# Patient Record
Sex: Male | Born: 1935 | Race: White | Hispanic: No | Marital: Single | Smoking: Never smoker
Health system: Southern US, Community
[De-identification: ages and names within clinical notes are randomized; demographics above are authoritative.]

## PROBLEM LIST (undated history)

## (undated) DIAGNOSIS — H5440 Blindness, one eye, unspecified eye: Secondary | ICD-10-CM

## (undated) DIAGNOSIS — R296 Repeated falls: Secondary | ICD-10-CM

## (undated) DIAGNOSIS — F0280 Dementia in other diseases classified elsewhere without behavioral disturbance: Secondary | ICD-10-CM

## (undated) DIAGNOSIS — C44509 Unspecified malignant neoplasm of skin of other part of trunk: Secondary | ICD-10-CM

## (undated) DIAGNOSIS — I639 Cerebral infarction, unspecified: Secondary | ICD-10-CM

## (undated) DIAGNOSIS — H3410 Central retinal artery occlusion, unspecified eye: Secondary | ICD-10-CM

## (undated) DIAGNOSIS — H3320 Serous retinal detachment, unspecified eye: Secondary | ICD-10-CM

## (undated) DIAGNOSIS — F32A Depression, unspecified: Secondary | ICD-10-CM

## (undated) DIAGNOSIS — R269 Unspecified abnormalities of gait and mobility: Secondary | ICD-10-CM

## (undated) DIAGNOSIS — G459 Transient cerebral ischemic attack, unspecified: Secondary | ICD-10-CM

## (undated) DIAGNOSIS — C61 Malignant neoplasm of prostate: Secondary | ICD-10-CM

## (undated) DIAGNOSIS — F329 Major depressive disorder, single episode, unspecified: Secondary | ICD-10-CM

## (undated) DIAGNOSIS — G309 Alzheimer's disease, unspecified: Secondary | ICD-10-CM

## (undated) HISTORY — PX: PROSTATECTOMY: SHX69

## (undated) HISTORY — DX: Central retinal artery occlusion, unspecified eye: H34.10

## (undated) HISTORY — PX: RETINAL DETACHMENT SURGERY: SHX105

## (undated) HISTORY — PX: SKIN CANCER EXCISION: SHX779

## (undated) HISTORY — PX: TONSILLECTOMY: SUR1361

## (undated) HISTORY — PX: CATARACT EXTRACTION W/ INTRAOCULAR LENS  IMPLANT, BILATERAL: SHX1307

## (undated) HISTORY — PX: EYE SURGERY: SHX253

## (undated) HISTORY — PX: HERNIA REPAIR: SHX51

## (undated) HISTORY — PX: ABDOMINAL HERNIA REPAIR: SHX539

## (undated) HISTORY — DX: Unspecified abnormalities of gait and mobility: R26.9

## (undated) HISTORY — PX: FRACTURE SURGERY: SHX138

---

## 2000-04-10 ENCOUNTER — Encounter: Admission: RE | Admit: 2000-04-10 | Discharge: 2000-07-09 | Payer: Self-pay | Admitting: Radiation Oncology

## 2000-05-11 ENCOUNTER — Encounter: Payer: Self-pay | Admitting: Urology

## 2000-05-15 ENCOUNTER — Inpatient Hospital Stay (HOSPITAL_COMMUNITY): Admission: RE | Admit: 2000-05-15 | Discharge: 2000-05-18 | Payer: Self-pay | Admitting: Urology

## 2000-05-15 ENCOUNTER — Encounter (INDEPENDENT_AMBULATORY_CARE_PROVIDER_SITE_OTHER): Payer: Self-pay | Admitting: Specialist

## 2001-02-16 ENCOUNTER — Encounter: Admission: RE | Admit: 2001-02-16 | Discharge: 2001-02-16 | Payer: Self-pay | Admitting: General Surgery

## 2001-02-16 ENCOUNTER — Encounter: Payer: Self-pay | Admitting: General Surgery

## 2001-02-20 ENCOUNTER — Ambulatory Visit (HOSPITAL_BASED_OUTPATIENT_CLINIC_OR_DEPARTMENT_OTHER): Admission: RE | Admit: 2001-02-20 | Discharge: 2001-02-20 | Payer: Self-pay | Admitting: General Surgery

## 2001-05-08 ENCOUNTER — Observation Stay (HOSPITAL_COMMUNITY): Admission: RE | Admit: 2001-05-08 | Discharge: 2001-05-09 | Payer: Self-pay | Admitting: General Surgery

## 2002-11-20 ENCOUNTER — Ambulatory Visit (HOSPITAL_COMMUNITY): Admission: RE | Admit: 2002-11-20 | Discharge: 2002-11-21 | Payer: Self-pay | Admitting: Ophthalmology

## 2002-11-20 ENCOUNTER — Encounter: Payer: Self-pay | Admitting: Ophthalmology

## 2005-11-01 ENCOUNTER — Ambulatory Visit (HOSPITAL_COMMUNITY): Admission: RE | Admit: 2005-11-01 | Discharge: 2005-11-02 | Payer: Self-pay | Admitting: Ophthalmology

## 2008-01-16 ENCOUNTER — Encounter (HOSPITAL_COMMUNITY): Admission: RE | Admit: 2008-01-16 | Discharge: 2008-01-24 | Payer: Self-pay | Admitting: Urology

## 2010-09-24 NOTE — Op Note (Signed)
[place]  Patient:    Jimmy Solis, Jimmy Solis Visit Number: [id number] MRN: [id number]          Service Type: DSU Location: [facility] Attending Physician:  Arlis Porta Dictated by:   Adolph Pollack, M.D. Proc. Date: 02/20/01 Admit Date:  02/20/2001   CC:         Mark C. Vernie Ammons, M.D.   Operative Report  PREOPERATIVE DIAGNOSIS:  Right inguinal hernia.  POSTOPERATIVE DIAGNOSIS:  Direct right inguinal hernia.  OPERATION PERFORMED:  Right inguinal hernia repair with mesh.  SURGEON:  Adolph Pollack, M.D.  ANESTHESIA:  Local 1% lidocaine with epinephrine plus 0.5% plain Marcaine with sodium bicarbonate with MAC.  INDICATIONS FOR PROCEDURE:  The patient is a 77 year old male who has noticed a growing right inguinal bulge.  He has been having some significant discomfort from the bulge.  On examination, it is consistent with a right inguinal hernia and now he presents for repair.  The procedure and the risks were explained to him preoperatively.  DESCRIPTION OF PROCEDURE:  He was placed supine on the operating table and given intravenous sedation.  The right groin area was shaved and sterilely prepped and draped.  An incision was marked with the marking pen.  Local anesthetic was infiltrated superficially and deep along the line of the incision and then the incision was made sharply down to the level of the Scarpas fascia which was then divided with the cautery.  More local anesthetic was infiltrated deep to the external oblique aponeurosis and then the external oblique aponeurosis was split in the direction of its fibers toward the pubic tubercle.  However, it appeared that the scar tissue from his radical prostatectomy incision appeared to make this more difficult. Laterally, it was more free and ____________  I identified the direct defect and separated the spermatic cord from it.  I next identified the shelving edge of the inguinal  ligament inferiorly and the internal oblique aponeurosis superiorly.  A piece of mesh was brought into the field and anchored 1 cm medial to the pubic tubercle with 2-0 Prolene suture. The inferior aspect of the mesh was then anchored to the shelving edge of the inguinal ligament up to the level of the internal ring with running 2-0 Prolene suture.  A slit was cut in the mesh and was wrapped around the tail. The superior aspect of the mesh was anchored to the internal oblique aponeurosis with interrupted 2-0 Vicryl sutures.  The two tails were crossed around the spermatic cord creating a new internal ring and this was anchored to the shelving edge of the inguinal ligament with 2-0 Prolene suture.  This allowed for the tip of the hemostat to be placed  through it.  The direct defect was adequately covered.  The tails of the mesh were placed laterally beneath the external oblique aponeurosis  which was then closed with a running 3-0 Vicryl suture over the mesh.  I did divide the ilioinguinal nerve because there was close adherence with the external oblique aponeurosis and the difficulty to separate it from that medially.  I then closed Scarpas fascia with a running 2-0 Vicryl suture.  The skin was closed with 4-0 Monocryl subcuticular stitch. Steri-Strips and sterile dressings were applied.  The patient tolerated the procedure well without any apparent complications and was taken to the recovery room in satisfactory condition. Dictated by:   Adolph Pollack, M.D. Attending Physician:  Arlis Porta DD:  02/20/01 TD:  02/20/01 Job: [id number]

## 2010-09-24 NOTE — Op Note (Signed)
NAME:  Jimmy Solis, Jimmy Solis                         ACCOUNT NO.:  [id number]   MEDICAL RECORD NO.:  [id number]                   PATIENT TYPE:  OIB   LOCATION:  2864                                 FACILITY:  [facility]   PHYSICIAN:  Beulah Gandy. Ashley Royalty, M.D.              DATE OF BIRTH:  24-Aug-1928   DATE OF PROCEDURE:  11/20/2002  DATE OF DISCHARGE:                                 OPERATIVE REPORT   PREOPERATIVE DIAGNOSES:  1. Retained lens material.  2. 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   PROCEDURES:  Pars plana vitrectomy with removal of retained lens material,  retinal photocoagulation, posterior capsulectomy, and wash-out of anterior  chamber in the left eye.   SURGEON:  Beulah Gandy. Ashley Royalty, M.D.   ASSISTANT:  Bryan Lemma. Lundquist, P.A.   ANESTHESIA:  General.   DETAILS:  Usual prep and drape.  Peritomies at 10, 2, and 4 o'clock.  The 6  mm infusion port was anchored into place at 4 o'clock.  The lighted pick and  the cutter were placed at 10 and 2 o'clock, respectively.  The vitreous  cutter was used to engage lens material from around the edges of the  implant.  Large areas of white material were seen behind the implant and  anterior to the implant.  This was carefully removed under low suction and  rapid cutting.  Care was taken to allow capsule to remain at the 3 and 9  o'clock position to maintain the implant's stability.  Provisc was then  placed on the corneal surface and the BIOM was moved into place.  The  vitrectomy was carried posteriorly, where all vitreous was carefully removed  under low suction and rapid cutting.  Large white pearly material was seen  and removed in this manner.  The eye was rotated in all directions to remove  pearly white material from the vitreous base for 360 degrees.  Two cotton  wool spots were seen along the lower arcade and below the disc.  The [place]  [facility] brush was used to approach these cotton wool spots to see if  there were pieces of lens  material that would lift but there were not, and  the retina was not touched.  A wash-out procedure was performed.  The  instruments were removed from the eye and 9-0 nylon was used to close the  sclerotomy sites.  The sites were tested and found to be tight.  The  conjunctiva was closed with wet-field cautery.  Polymyxin and gentamicin  were irrigated into Tenon's space.  Decadron 10 mg was injected into the  lower subconjunctival space.  Marcaine was injected around the globe for  postop pain.  The indirect ophthalmoscope laser was moved into place and 496  burns were placed around the retinal periphery in two rows with a power of  between 500 and 600 milliwatts and 0.05 to 0.07 seconds each.  The diameter  was 1000 microns.  Once this was accomplished, the pressure was checked and  found to be 10 with a Barraquer tonometer.  Polysporin, a patch and shield  were placed.  The patient was awakened and taken to recovery in a  satisfactory condition.                                               Beulah Gandy. Ashley Royalty, M.D.    JDM/MEDQ  D:  11/20/2002  T:  11/21/2002  Job:  [id number]

## 2010-09-24 NOTE — Op Note (Signed)
NAMEBEAUMONT, AUSTAD               ACCOUNT NO.:  [id number]   MEDICAL RECORD NO.:  [id number]          PATIENT TYPE:  AMB   LOCATION:  SDS                          FACILITY:  [facility]   PHYSICIAN:  John D. Ashley Royalty, M.D. DATE OF BIRTH:  [birth year]   DATE OF PROCEDURE:  11/01/2005  DATE OF DISCHARGE:                                 OPERATIVE REPORT   PREOPERATIVE DIAGNOSIS:  Rhegmatogenous retinal detachment left eye.   PROCEDURES:  Scleral buckle, pars plana vitrectomy, retinal  photocoagulation, membrane peel, gas fluid exchange, Perfluoron injection,  Perfluoron removal, C3F8 injection, left eye.   SURGEON:  Alan Mulder, M.D.   ASSISTANT:  Bryan Lemma. Lundquist, P.A.   ANESTHESIA:  General.   DETAILS:  Usual prep and drape, 360 degrees limbal peritomy, isolation of  four rectus muscles on 2-0 silk.  Scleral dissection for 360 degrees to  admit number 279 intrascleral implant.  Diathermy placed in the bed. Two  sutures per quadrant for a total of eight sutures were placed in the scleral  flaps.  The 279 implant was placed around the eye with a joint at 2 o'clock  and 240 band was placed around the eye with a 270 sleeve at 10 o'clock.  Perforation site chosen at 10 o'clock in the posterior aspect of the bed.  A  moderate amount of clear colorless subretinal fluid came forth.  The scleral  buckle was shortened and trimmed.  The band was adjusted and trimmed.  The  sutures were knotted and the free ends removed.  The attention was carried  to the pars plana area where sclerotomy sites were entered at 10, 2, and 4  o'clock.  The 5-mm infusion port placed at 4 o'clock.  Provisc placed on the  corneal surface and the Biome viewing system moved into place.  The pars  plana vitrectomy was begun just behind the pseudophakos.  The vitrectomy is  carried posteriorly and some vitreous membranes were seen.  These were  engaged with the vitreous pick and peeled. The vitreous strands were fed  into the vitreous cutter.  The vitrectomy is carried into the periphery  where vitreous was removed from its attachments to the vitreous base and the  areas where the buckle was supporting the peripheral retina.  Perfluoron was  then injected over the macular region to reattach the entire retina.  The  endolaser was positioned in the eye, 840 burns placed around the retinal  periphery with a power of 1000 milliwatts, 1000 microns each and 0.1 seconds  each.  Perfluoron was removed.  The vitreous cavity was rinsed. Gas fluid  exchange was performed.  Perfluoron propane 14% concentration was then  exchanged for intravitreal gas.  The instruments removed from the eye and 9-  0 nylon was used to close the sclerotomy sites.  The conjunctiva was  reposited with 7-0 chromic suture.  Paracentesis x1 obtained a closing  pressure of 15 with a Barraquer tonometer. Polymyxin and gentamicin were  irrigated into tenons space, Decadron 10 mg was injected to  the lower subconjunctival space.  Marcaine was injected around the  globe for  postop pain.  TobraDex ophthalmic ointment, patch and shield were placed.  The patient is awakened and taken to recovery in satisfactory condition.  The complications were none.  Duration was 2-1/2 hours.      Beulah Gandy. Ashley Royalty, M.D.  Electronically Signed     JDM/MEDQ  D:  11/01/2005  T:  11/02/2005  Job:  [id number]

## 2010-09-24 NOTE — H&P (Signed)
[facility]  Patient:    Jimmy Solis, Jimmy Solis Visit Number: [id number] MRN: [id number]          Service Type: DSU Location: DAY Attending Physician:  Arlis Porta Dictated by:   Adolph Pollack, M.D. Admit Date:  05/08/2001                           History and Physical  REASON FOR ADMISSION:  Elective repair of recurrent right inguinal hernia.  HISTORY OF PRESENT ILLNESS:  Jimmy Solis is a 77 year old male who underwent repair of a right inguinal hernia by me in October.  On the second postoperative day he got out of bed and twisted in a very awkward-type way and felt severe pain near the medial aspect of the incision and then noticed a bulge.  When I saw him for his first postoperative visit in early November, indeed, he had an early recurrent hernia.  He now presents for repair.  We are going to try the laparoscopic technique.  PAST MEDICAL HISTORY: 1. Prostate cancer. 2. Hypercholesterolemia. 3. Cataracts.  PAST SURGICAL HISTORY: 1. Radical retropubic prostatectomy. 2. Tonsillectomy. 3. Cataract removal from right eye. 4. Right inguinal hernia repair.  ALLERGIES:  PENICILLIN.  MEDICATIONS:  Pravachol.  SOCIAL HISTORY:  No tobacco or alcohol use.  FAMILY HISTORY:  Father died from tuberculosis.  Mother died from a stroke. Brother died from lung cancer.  PHYSICAL EXAMINATION:  GENERAL:  Well-developed, well-nourished male in no acute distress.  Alert, pleasant, and cooperative.  CARDIOVASCULAR:  Regular rate and rhythm without a murmur.  LUNGS:  Breath sounds equal and clear.  Respirations not labored.  ABDOMEN:  Soft and nontender.  Well-healed lower midline scar.  GENITOURINARY:  Healed right inguinal incision.  In the supine position he has a reducible recurrent hernia.  EXTREMITIES:  No cyanosis or edema.  IMPRESSION:  Early recurrence of right inguinal hernia repair with mesh.  I did tell him that during the  procedure I noticed that the area where I normally fix the mesh medially demonstrated very weak tissue.  PLAN:  Laparoscopic transabdominal preperitoneal approach.  I did explain the procedure, rationale, and risks to him.  He understands these and is agreeable to proceed. Dictated by:   Adolph Pollack, M.D. Attending Physician:  Arlis Porta DD:  05/08/01 TD:  05/08/01 Job: [id number]

## 2010-09-24 NOTE — Op Note (Signed)
[facility]  Patient:    Jimmy Solis, Jimmy Solis Visit Number: [id number] MRN: [id number]          Service Type: SUR Location: 3W 0362 01 Attending Physician:  Arlis Porta Dictated by:   Adolph Pollack, M.D. Proc. Date: 05/08/01 Admit Date:  05/08/2001                             Operative Report  PREOPERATIVE DIAGNOSIS:   Recurrent right inguinal hernia.  POSTOPERATIVE DIAGNOSIS:  Recurrent right inguinal hernia.  PROCEDURE:  Laparoscopic repair of recurrent right inguinal hernia with mesh (TAPP).  SURGEON:  Adolph Pollack, M.D.  ANESTHESIA:  General.  INDICATION:  This is a 77 year old male, who back in October, had an open right inguinal hernia repaired with mesh that rapidly recurred.  He now presents for laparoscopic transabdominal hernia repair.  I discussed with him I felt this was best because he had had the radical retropubic prostatectomy, and I did not think an extraperitoneal approach could be used.  Also, I did not think that a redo anterior approach would be helpful because he had such weak tissue to anchor the mesh medially.  TECHNIQUE:  He was placed supine on the operating table, and a general anesthetic was administered.  Foley catheter was sterilely placed into his bladder.  The abdomen and groin area were sterilely prepped and draped.  Local anesthetic was infiltrated in the infraumbilical region, and a small infraumbilical incision made and carried down to the subcutaneous tissue.  The fascia was identified in the midline and an incision made in the fascia, the peritoneal cavity entered bluntly and under direct vision.  A pursestring suture of 0 Vicryl was placed around the fascial edges.  A Hasson trocar was introduced to the peritoneal cavity and pneumoperitoneum created by insufflation of CO2 gas.  Next, the laparoscope was introduced.  He was placed in the Trendelenburg position.  There, the hernia on the  right side was evident.  I put two 5 mm trocars just to the left of the midline into the peritoneal cavity under direct vision.  I then incised the peritoneum of the anterior abdominal wall, superior to the internal ring, and carried this across the epigastric vessels to the level of the left epigastric vessels.  Using blunt dissection, I mobilized the peritoneum posteriorly to expose the anterior extraperitoneal space.  I subsequently was able to reduce a hernia sac from the internal ring in the indirect position.  I isolated all the spermatic cord structures.  I continued to reduce the peritoneum posteriorly, anteriorly, and laterally.  I then used blunt dissection and some sharp dissection to reduce it medially.  I did identify Coopers ligament.  Using blunt dissection, I isolated it to the symphysis pubis and a little bit to the left of that area.  I did do some blunt manipulation below it.  Once I had all this area cleaned off, I cut a piece of mesh to 4 inches x 6 inches.  On one side of the mesh was Seprafilm nonadherent-type material, and the other side was polypropylene.  Thus, a composite-type mesh.  I placed into the abdominal cavity.  I then anchored the inferomedial aspect of the mesh to Coopers ligament with the spiral tacker. I subsequently anchored the anterior bilateral aspect of the mesh to the abdominal wall and then used tags to anchor it to the anterior abdominal wall and to the  midline area.  This provided adequate coverage of the direct and indirect spaces.  I inspected the area and noted no active bleeding.  There was some old blood in it, and I did evacuate this.  I then grasped the peritoneum and tacked it back up to the mesh, covering the mesh in most spaces, but there still was some exposed area of the Seprafilm. I released the pneumoperitoneum and observed the abdominal contents approximating to the mesh.  I then removed the trocars.  The subumbilical fascial  defect was closed by tightening up and tying down the pursestring suture.  The skin incisions were closed with 4-0 Monocryl subcuticular stitches.  Steri-Strips and sterile dressings were applied.  I examined his scrotum, and the right testicle was in the appropriate position.  I did notice that his urine was somewhat blood-tinged.  I put some pressure on the suprapubic region, and it started to clear up some, but I decided to leave the Foley in.  He subsequently was extubated and taken to the recovery room in satisfactory condition.  He will be kept overnight in a bed for observation. Dictated by:   Adolph Pollack, M.D. Attending Physician:  Arlis Porta DD:  05/08/01 TD:  05/08/01 Job: [id number]

## 2010-09-24 NOTE — H&P (Signed)
[facility]  Patient:    Jimmy Solis, COPELAN                     MRN: [id number] Adm. Date:  05/16/00 Attending:  Loraine Leriche C. Vernie Ammons, M.D.                         History and Physical  CHIEF COMPLAINT:  Prostate cancer.  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  His primary care physician is Dr. Lawana Pai.  He has a history of having had a biopsy back in September 1990 that revealed BPH.  Due to a rise in his PSA to 3.7 in April 2001 and further rise to 3.8 in September 2001, he underwent repeat biopsy in November 2001. His examination at that time had revealed suspicious nodule that had developed on the right lobe of his prostate, and his biopsy revealed adenocarcinoma, Gleason score of 7 (3+4), involving approximately 25% of the needle core biopsies from that site.  Perineural space was identified.  Benign prostatic hypertrophy was noted on the left side.  He had previously discussed his treatment options with Dr. Harvin Hazel, and also had discussed his options thoroughly with Dr. Dan Humphreys.  When he presented to my office, he had pretty much made a decision to proceed with a radical prostatectomy, but we went over this further once again from the beginning just so that I was sure he understood all that was involved.  He is admitted at this time for elective radical retropubic prostatectomy.  PAST MEDICAL HISTORY:  Hypertension and hypercholesterolemia.  PAST SURGICAL HISTORY:  Tonsillectomy in 1934.  MEDICATIONS:  Aspirin, niacin, Pravachol 40 mg, and a multivitamin.  ALLERGIES:  PENICILLIN.  SOCIAL HISTORY:  Negative for tobacco or ethanol.  FAMILY HISTORY:  His father died of tuberculosis at 33 years of age and mother died of a stroke at 49.  He has a sibling that died of lung cancer at age 23m and  he has hypertension in his family.  REVIEW OF SYSTEMS:  Per health history, section 3 in his chart, and it is basically negative other than some hemospermia.  PHYSICAL EXAMINATION:  VITAL SIGNS:  Blood pressure 148/95, pulse 89, temperature 98.9.  GENERAL:  The patient is a well-developed, well-nourished white male in no apparent distress.  HEENT:  Normocephalic, atraumatic.  PERRLA.  He does have a small, slightly discolored lesion on his upper left eyelid.  EOMI.  Oropharynx is clear.  NECK:  Supple without JVD or adenopathy.  CHEST:  Clear to auscultation and percussion.  CARDIOVASCULAR:  Regular rate and rhythm without murmurs, rub, clicks, or gallops.  Good peripheral pulses throughout.  ABDOMEN:  Soft and nontender without mass or HSM.  He has no inguinal hernias or adenopathy.  GENITOURINARY:  Normal glans, meatus, scrotum, testicles, and epididymis.  He has a moderate-sized left varicocele that is nontender.  He has normal anus and perineum with normal rectal tone.  His prostate is slightly enlarged, smooth, and symmetric.  He has a raised nodule laterally on the right hand side which possibly is contained within the prostate and no fixation or extension outside of the prostate and no seminal vesicle abnormality identified.  EXTREMITIES:  Without clubbing, cyanosis, edema, or deformity.  SKIN:  Warm and dry.  NEUROLOGIC:  No gross  focal neurologic deficits.  An EKG reveals normal sinus rhythm with some nonspecific ST abnormality but no acute ischemic changes.  Chest x-ray reveals no active cardiopulmonary disease.  Urinalysis is clear, completely negative by dipstick.  His PTT is 24.  His PT is 12.4 with an INR of 0.9.  White count 5.5, H&H 14.9 and 41.8, platelets 265,000.  Sodium 142, potassium 4.9, chloride 106, CO2 31, glucose 81, BUN 13, creatinine 1.1, calcium 10.3.  Liver function tests are normal.  His alkaline phosphatase is normal.  IMPRESSION:  He  appears to have organ-confined adenocarcinoma of the prostate, Gleason score 7.  There is some perineural space invasion.  He and I have had a long discussion about the procedure, its risks and complications, and potential outcomes.  This is fully outlined in my office notes which have been placed in the chart.  He is admitted at this time to be taken to the OR for a radical prostatectomy.  PLAN: 1. Radical prostatectomy, bilateral lymph node dissection. 2. Routine deep venous thrombosis prophylaxis. 3. Perioperative antibiotics. DD:  05/15/00 TD:  05/15/00 Job: [id number]

## 2010-09-24 NOTE — Op Note (Signed)
[facility]  Patient:    Jimmy Solis, Jimmy Solis                     MRN: [id number] Proc. Date: 05/15/00 Attending:  Loraine Leriche C. Vernie Ammons, M.D.                           Operative Report  PREOPERATIVE DIAGNOSIS:  Adenocarcinoma of the prostate.  POSTOPERATIVE DIAGNOSES:  Adenocarcinoma of the prostate.  PROCEDURE:  Radical retropubic prostatectomy and bilateral pelvic lymph node dissection (nerve sparing).  SURGEON:  Dr. Vernie Ammons.  ANESTHESIA:  General.  DRAINS:  20 French Foley catheter and a #10 flat Blake drain in the pelvis.  ESTIMATED BLOOD LOSS:  Approximately 500 cc.  SPECIMENS:  Prostate and right and left pelvic lymph nodes to pathology.  COMPLICATIONS:  None.  INDICATIONS FOR PROCEDURE:  The patient is a 77 year old white male with biopsy proven adenocarcinoma of the prostate and Gleason score 7 in the right lobe of his prostate. He understands the risks, complications and alternatives to surgery and has elected to proceed with surgical therapy.  DESCRIPTION OF PROCEDURE:  After informed consent, the patient was brought to the major OR, placed on the table and administered general anesthesia in the supine position. His lower abdomen and genitalia was sterilely prepped and draped and a 22 French Foley catheter was then inserted in the bladder and filled with 30 cc of sterile water. A midline was then made below the umbilicus to the superior border of the symphysis pubis and carried down through subcutaneous fat to the rectus fascia which was incised in the midline. The rectus muscle bellies were then parted in the midline and the retropubic space of Retzius was then entered and the Buchwalter retractor placed to afford exposure of the right pelvic side wall.  Right pelvic lymph node dissection was then undertaken after first palpating the nodes and noting no suspicious enlarged lymphadenopathy. I then incised along the anterior medial surface  of the external iliac vein from its bifurcation to the obturator fossa. I then clipped and divided lymphatic and vascular channels as I dissected the nodal package off of the inferior surface of the vein and pelvic side wall. Lymphatics exiting the leg were clipped and divided and then I swept the nodal package off of the obturator nerve in a cephalad direction exposing the nerve throughout its length. The remaining lymphatic channels were clipped and divided and the specimen was freed and sent to pathology. The retractor was then replaced to afford exposure to the left pelvic side wall and then an identical procedure was performed on that side. The nodes on that side also were palpably normal.  The retractor was then replaced to afford exposure to the deep pelvis and the endopelvic fascia was then incised lateral to the prostate on both sides. I then cleared off surrounding tissue from the puboprostatic ligaments and divided these sharply with the Metzenbaum scissors flush with the symphysis pubis. I passed a Hoenfelter clamp beneath the dorsal vein complex and then encircled this with a #1 Vicryl suture and tied this down. I then divided the dorsal vein complex with the Bovie distal to the apex of the prostate down to the urethra. The urethra was then incised anteriorly with the knife and the catheter was exposed. I then passed a right angle beneath the urethra and an umbilical tape around this region. The catheter was then pulled into the pelvis  and divided exposing the inferior urethra which was cut with the Metzenbaum scissors. Denonvilliers fascia was then identified and a right angle placed beneath this and it was divided anterior to the rectum. I then developed the space between the prostate and the rectum bluntly and then isolated the vascular pedicles to the prostate gland on the right side first with the right angled clamp placing a hemoclip on the proximal portion of  the pedicle and dividing it as I progressed up toward the base of the prostate. This was performed on the contralateral side in a nerve sparing technique remaining close to the prostate gland in order to attempt preservation of the neurovascular bundle. By this time, the reflection of Denonvilliers fascia was then noted reflecting off of the prostate and over the seminal vesicles. I incised this and exposed the ampulla of the vas were individually isolated, clipped and divided. The seminal vesicles were then isolated, clipped and divided.  Attention was directed to the bladder neck region where I incised between the prostate and bladder neck and exposed the bladder. The catheter was deflated and the ureteral orifices were identified after 1 ampule of indigo carmine was administered intravenously. I then excised the remaining prostate from the bladder neck and freed this and sent it to pathology.  The bladder neck was then reconstructed first by everting the bladder neck mucosa with interrupted 4-0 chromic suture. I then closed the bladder neck in a tennis racquet fashion using 2-0 chromic suture. There was some oozing from the dorsal vein complex which was over sewn with 2-0 Vicryl and then a fresh 20 French Foley catheter was inserted paraurethra and into the pelvis. I then placed 2-0 Vicryl sutures in the bladder neck at the 10, 2, 5, 7 and 12 oclock positions and those respective locations in the urethra. A #1 Prolene was placed through the eye of the Foley catheter and through the bladder neck and anterior surface of the bladder. The catheter was then placed in the bladder and filled with 15 cc of water and the Prolene then brought out through the right lower quadrant and secured to the skin with a button. The retractor was removed allowing the bladder to move into position adjacent to the urethra and mild traction was applied to the catheter while the bladder neck sutures were  individually tied. The bladder was irrigated with no clots or bleeding being noted. I then placed a Blake drain in the pelvis through a separate stab incision in the left lower quadrant and secured to the skin with  a 2-0 silk suture. This was placed in the region of the bladder neck. No significant bleeding was noted. I therefore closed the rectus fascia with running #1 PDS suture and copiously irrigated the subcu tissue and closed the skin with skin staples. The patient tolerated the procedure well. There were no intraoperative complications and needle, sponge and instrument counts were reportedly correct x 2 at the end of the operation. DD:  05/15/00 TD:  05/15/00 Job: [id number]

## 2010-09-24 NOTE — Discharge Summary (Signed)
[facility]  Patient:    Jimmy Solis, Jimmy Solis                        MRN: [id number] Adm. Date:  [id number] Disc. Date: 05/18/00 Attending:  Trisha Mangle                           Discharge Summary  PRINCIPAL DIAGNOSIS:  Adenocarcinoma of the prostate.  OTHER DIAGNOSES: 1. Hypertension. 2. Hypercholesterolemia.  OPERATIONS:  Radical retropubic prostatectomy and bilateral pelvic lymph node dissection.  Pathology revealed Gleason score 7 (3+4) confined to the prostate with negative margins (pT2a, pN0, pMx).  DISPOSITION:  The patient is discharged home in a stable, satisfactory, and improved condition.  He is tolerating a regular diet, he is afebrile, and his bowels are moving.  There are no signs of infection.  FOLLOWUP:  In my office in 3 days for staple removal.  DISCHARGE MEDICATIONS: 1. Tylox one or two q.4h. p.r.n. pain #40. 2. Cipro 250 mg p.o. b.i.d. #28. 3. Colace 100 mg b.i.d. #30 one refill.  DIET:  Unrestricted.  ACTIVITY:  Limited to no heavy lifting, driving, or strenuous activity as per post radical prostatectomy instruction sheet given to the patient.  HISTORY OF PRESENT ILLNESS:  The patient is a 77 year old white male who was noted to have a prostate nodule on digital exam.  This was noted in the right lobe of the prostate, and despite a PSA of 3.8, a biopsy revealed Gleason score 7 adenocarcinoma involving approximately 25% of the needle core biopsies from that site.  He had extensively reviewed the treatment options, and we had discussed them in my office as well.  This is outlined in my office note, and a H&P which had been placed in the chart and previously dictated.  HOSPITAL COURSE:  On May 15, 2000, the patient was taken to the OR and underwent a radical retropubic prostatectomy and bilateral pelvic lymph node dissection without complication or need for transfusion.  He left the OR with a Foley catheter indwelling,  as well as a Blake drain in the pelvis.  There was an approximately 500 cc blood loss, and the evening of his surgery he appeared to be doing well, draining clear urine, and without major complaint. On his first postoperative day he continued to have good urine output with a dry dressing.  He was advanced to clear liquids, and began ambulating.  By his second postoperative day he had a temperature max of 100.3, but otherwise remained afebrile with stable vital signs.  His drain had minimal output.  He continued to have good urine output, and his pathology returned and I discussed that with him.  His PCA was stopped, and he was started on oral medications and a regular diet, as well as laxatives.  He continued ambulating and pulmonary toilet.  By his third postoperative day, he was without complaint, his bowels had moved, he tolerated a regular diet, and he was afebrile.  His drain was putting out approximately 35 cc per shift and was removed.  His incision was healing well, and he was felt ready for discharge.DD:  05/18/00 TD:  05/18/00 Job: [id number]

## 2011-10-16 ENCOUNTER — Emergency Department (HOSPITAL_BASED_OUTPATIENT_CLINIC_OR_DEPARTMENT_OTHER)
Admission: EM | Admit: 2011-10-16 | Discharge: 2011-10-16 | Disposition: A | Discharge status: Home / Self Care | Payer: Medicare Other | Attending: Emergency Medicine | Admitting: Emergency Medicine

## 2011-10-16 ENCOUNTER — Emergency Department (HOSPITAL_BASED_OUTPATIENT_CLINIC_OR_DEPARTMENT_OTHER): Payer: Medicare Other

## 2011-10-16 ENCOUNTER — Encounter (HOSPITAL_BASED_OUTPATIENT_CLINIC_OR_DEPARTMENT_OTHER): Payer: Self-pay | Admitting: *Deleted

## 2011-10-16 DIAGNOSIS — S0081XA Abrasion of other part of head, initial encounter: Secondary | ICD-10-CM

## 2011-10-16 DIAGNOSIS — S1093XA Contusion of unspecified part of neck, initial encounter: Secondary | ICD-10-CM | POA: Insufficient documentation

## 2011-10-16 DIAGNOSIS — S0003XA Contusion of scalp, initial encounter: Secondary | ICD-10-CM | POA: Insufficient documentation

## 2011-10-16 DIAGNOSIS — W101XXA Fall (on)(from) sidewalk curb, initial encounter: Secondary | ICD-10-CM | POA: Insufficient documentation

## 2011-10-16 DIAGNOSIS — S0120XA Unspecified open wound of nose, initial encounter: Secondary | ICD-10-CM | POA: Insufficient documentation

## 2011-10-16 DIAGNOSIS — S0083XA Contusion of other part of head, initial encounter: Secondary | ICD-10-CM

## 2011-10-16 DIAGNOSIS — IMO0002 Reserved for concepts with insufficient information to code with codable children: Secondary | ICD-10-CM | POA: Insufficient documentation

## 2011-10-16 DIAGNOSIS — S0121XA Laceration without foreign body of nose, initial encounter: Secondary | ICD-10-CM

## 2011-10-16 DIAGNOSIS — W19XXXA Unspecified fall, initial encounter: Secondary | ICD-10-CM

## 2011-10-16 DIAGNOSIS — S022XXA Fracture of nasal bones, initial encounter for closed fracture: Secondary | ICD-10-CM | POA: Insufficient documentation

## 2011-10-16 MED ORDER — LIDOCAINE-EPINEPHRINE 2 %-1:100000 IJ SOLN: 20.0000 mL | Freq: Once | INTRAMUSCULAR | Status: DC

## 2011-10-16 MED ORDER — CEPHALEXIN 500 MG PO CAPS: 500.0000 mg | ORAL_CAPSULE | Freq: Three times a day (TID) | ORAL | Status: AC

## 2011-10-16 MED ORDER — LIDOCAINE-EPINEPHRINE 2 %-1:100000 IJ SOLN
INTRAMUSCULAR | Status: AC
  Filled 2011-10-16: qty 1

## 2011-10-16 NOTE — Discharge Instructions (Signed)
Laceration Care, Adult A laceration is a cut that goes through all layers of the skin. The cut goes into the tissue beneath the skin. HOME CARE For stitches (sutures) or staples:  Keep the cut clean and dry.   If you have a bandage (dressing), change it at least once a day. Change the bandage if it gets wet or dirty, or as told by your doctor.   Wash the cut with soap and water 2 times a day. Rinse the cut with water. Pat it dry with a clean towel.   Put a thin layer of medicated cream on the cut as told by your doctor.   You may shower after the first 24 hours. Do not soak the cut in water until the stitches are removed.   Only take medicines as told by your doctor.   Have your stitches or staples removed as told by your doctor.  For skin adhesive strips:  Keep the cut clean and dry.   Do not get the strips wet. You may take a bath, but be careful to keep the cut dry.   If the cut gets wet, pat it dry with a clean towel.   The strips will fall off on their own. Do not remove the strips that are still stuck to the cut.  For wound glue:  You may shower or take baths. Do not soak or scrub the cut. Do not swim. Avoid heavy sweating until the glue falls off on its own. After a shower or bath, pat the cut dry with a clean towel.   Do not put medicine on your cut until the glue falls off.   If you have a bandage, do not put tape over the glue.   Avoid lots of sunlight or tanning lamps until the glue falls off. Put sunscreen on the cut for the first year to reduce your scar.   The glue will fall off on its own. Do not pick at the glue.  You may need a tetanus shot if:  You cannot remember when you had your last tetanus shot.   You have never had a tetanus shot.  If you need a tetanus shot and you choose not to have one, you may get tetanus. Sickness from tetanus can be serious. GET HELP RIGHT AWAY IF:   Your pain does not get better with medicine.   Your arm, hand, leg, or  foot loses feeling (numbness) or changes color.   Your cut is bleeding.   Your joint feels weak, or you cannot use your joint.   You have painful lumps on your body.   Your cut is red, puffy (swollen), or painful.   You have a red line on the skin near the cut.   You have yellowish-white fluid (pus) coming from the cut.   You have a fever.   You have a bad smell coming from the cut or bandage.   Your cut breaks open before or after stitches are removed.   You notice something coming out of the cut, such as wood or glass.   You cannot move a finger or toe.  MAKE SURE YOU:   Understand these instructions.   Will watch your condition.   Will get help right away if you are not doing well or get worse.  Document Released: 10/12/2007 Document Revised: 04/14/2011 Document Reviewed: 10/19/2010 [facility] Patient Information 2012 [place], [place].Nasal Fracture A fracture is a break in the bone. A nasal fracture is a  broken nose. Minor breaks do not need treatment. Serious breaks may need surgery.  HOME CARE  Put ice on the injured area.   Put ice in a plastic bag.   Place a towel between your skin and the bag.   Leave the ice on for 15 to 20 minutes, 3 to 4 times a day.   Only take medicine as told by your doctor.   If your nose bleeds, squeeze your nose shut gently. Sit upright for 10 minutes.   Do not play contact sports for 3 to 4 weeks or as told by your doctor.  GET HELP RIGHT AWAY IF:   You have more pain or severe pain.   You keep having nosebleeds.   The shape of your nose does not return to normal after 5 days.   You have yellowish white fluid (pus) coming from your nose.   Your nose bleeds for over 20 minutes.   Clear fluid drains from your nose.   You have a grape-like puffiness (swelling) on the inside of your nose.   You have trouble moving your eyes.   You keep throwing up (vomiting).  MAKE SURE YOU:   Understand these instructions.   Will  watch this condition.   Will get help right away if you are not doing well or get worse.  Document Released: 02/02/2008 Document Revised: 04/14/2011 Document Reviewed: 08/09/2010 [facility] Patient Information 2012 [place], [place].

## 2011-10-16 NOTE — ED Notes (Addendum)
Pt presents to ED today after tripping over curb.  Pt has multiple abrasions to face.  Pt denies any LOC at time and answers all questions appropriately.

## 2011-10-16 NOTE — ED Notes (Signed)
Pt states that this Pm he tripped and fell off a curb pt with multiple abrasions to face and lac to nose denies LOC alert and oriented.

## 2011-10-16 NOTE — ED Provider Notes (Signed)
History   This chart was scribed for Geoffery Lyons, MD by Melba Coon. The patient was seen in room MH04/MH04 and the patient's care was started at 8:15PM.    CSN: [id number]  Arrival date & time 10/16/11  1912   First MD Initiated Contact with Patient 10/16/11 2019      Chief Complaint  Patient presents with  . Fall  . Facial Injury    (Consider location/radiation/quality/duration/timing/severity/associated sxs/prior treatment) HPI Jimmy Solis is a 78 y.o. male who presents to the Emergency Department complaining of a moderate to severe facial injury with an onset 2 hrs ago pertaining to a fall with head contact, no LOC. Pt slipped off a rocky street curb. Pt suffered multiple lacerations to the facial region. Slight facial pain present. Nml vision present. No fever, neck pain, sore throat, rash, back pain, CP, SOB, abd pain, n/v/d, dysuria, or extremity pain, edema, weakness, numbness, or tingling. Allergic to penicillin. No other pertinent medical symptoms.  History reviewed. No pertinent past medical history.  History reviewed. No pertinent past surgical history.  History reviewed. No pertinent family history.  History  Substance Use Topics  . Smoking status: Never Smoker   . Smokeless tobacco: Not on file  . Alcohol Use: No      Review of Systems 10 Systems reviewed and all are negative for acute change except as noted in the HPI.   Allergies  Penicillins  Home Medications   Current Outpatient Rx  Name Route Sig Dispense Refill  . ASPIRIN 81 MG PO TABS Oral Take 81 mg by mouth daily.      BP 119/72  Pulse 93  Resp 18  Ht 5\' 4"  (1.626 m)  Wt 135 lb (61.236 kg)  BMI 23.17 kg/m2  SpO2 98%  Physical Exam  Nursing note and vitals reviewed. Constitutional:       Awake, alert, nontoxic appearance.  HENT:  Head: Atraumatic.  Eyes: Right eye exhibits no discharge. Left eye exhibits no discharge.  Neck: Neck supple.  Pulmonary/Chest: Effort normal. He  exhibits no tenderness.  Abdominal: Soft. There is no tenderness. There is no rebound.  Musculoskeletal: He exhibits no tenderness.       Baseline ROM, no obvious new focal weakness.  Neurological:       Mental status and motor strength appears baseline for patient and situation.  Skin: No rash noted.       Multiple facial lacerations above and below left eye to the upper lid and bridge of nose; 1.5 cm lac to bridge of nose  Psychiatric: He has a normal mood and affect.    ED Course  Procedures (including critical care time)  DIAGNOSTIC STUDIES: Oxygen Saturation is 98% on room air, normal by my interpretation.    COORDINATION OF CARE:  8:20PM - head CT will be ordered for the pt. EDMD believes pt needs stitches at the bridge of the nose   Labs Reviewed - No data to display Ct Head Wo Contrast  10/16/2011  *RADIOLOGY REPORT*  Clinical Data: Fall, trauma.  CT HEAD WITHOUT CONTRAST,CT MAXILLOFACIAL WITHOUT CONTRAST  Technique:  Contiguous axial images were obtained from the base of the skull through the vertex without contrast.,Technique: Multidetector CT imaging of the maxillofacial structures was performed. Multiplanar CT image reconstructions were also generated.  Comparison: None.  Findings:  Head: Prominence of the sulci, cisterns, and ventricles, in keeping with volume loss. There are subcortical and periventricular white matter hypodensities, a nonspecific finding most often seen with  chronic microangiopathic changes.  There is no evidence for acute hemorrhage, overt hydrocephalus, mass lesion, or abnormal extra-axial fluid collection.  No definite CT evidence for acute cortical based (large artery) infarction. Focal areas of hypoattenuation within the right anterior limb internal capsule, lentiform nucleus, and left thalamus, in keeping with age indeterminate(though favored remote) lacunar infarctions.  The visualized paranasal sinuses and mastoid air cells are predominately clear.  No  displaced calvarial fracture.  Maxillofacial:  Displaced bilateral nasal bone fractures.  The left fracture may include the tip of the nasal process of the maxilla. The nasal septum is intact.  The paranasal sinuses are clear with intact sinus walls.  Postoperative changes of the globes bilaterally, with banding on the left and bilateral lens replacements.  No retrobulbar hematoma.  The orbital walls are intact. Small left frontal scalp hematoma.  Intact zygomatic arches, pterygoid plates, and mandible.  Degenerative changes of the upper cervical spine without acute osseous abnormality identified.  IMPRESSION: White matter changes and volume loss as above.  No definite acute intracranial abnormality.  Bilateral nasal bone fractures and possible fracture of the left nasal process of the maxilla.  Original Report Authenticated By: Waneta Martins, M.D.   Ct Maxillofacial Wo Cm  10/16/2011  *RADIOLOGY REPORT*  Clinical Data: Fall, trauma.  CT HEAD WITHOUT CONTRAST,CT MAXILLOFACIAL WITHOUT CONTRAST  Technique:  Contiguous axial images were obtained from the base of the skull through the vertex without contrast.,Technique: Multidetector CT imaging of the maxillofacial structures was performed. Multiplanar CT image reconstructions were also generated.  Comparison: None.  Findings:  Head: Prominence of the sulci, cisterns, and ventricles, in keeping with volume loss. There are subcortical and periventricular white matter hypodensities, a nonspecific finding most often seen with chronic microangiopathic changes.  There is no evidence for acute hemorrhage, overt hydrocephalus, mass lesion, or abnormal extra-axial fluid collection.  No definite CT evidence for acute cortical based (large artery) infarction. Focal areas of hypoattenuation within the right anterior limb internal capsule, lentiform nucleus, and left thalamus, in keeping with age indeterminate(though favored remote) lacunar infarctions.  The visualized  paranasal sinuses and mastoid air cells are predominately clear.  No displaced calvarial fracture.  Maxillofacial:  Displaced bilateral nasal bone fractures.  The left fracture may include the tip of the nasal process of the maxilla. The nasal septum is intact.  The paranasal sinuses are clear with intact sinus walls.  Postoperative changes of the globes bilaterally, with banding on the left and bilateral lens replacements.  No retrobulbar hematoma.  The orbital walls are intact. Small left frontal scalp hematoma.  Intact zygomatic arches, pterygoid plates, and mandible.  Degenerative changes of the upper cervical spine without acute osseous abnormality identified.  IMPRESSION: White matter changes and volume loss as above.  No definite acute intracranial abnormality.  Bilateral nasal bone fractures and possible fracture of the left nasal process of the maxilla.  Original Report Authenticated By: Waneta Martins, M.D.     No diagnosis found.  LACERATION REPAIR Performed by: Geoffery Lyons Authorized by: Geoffery Lyons Consent: Verbal consent obtained. Risks and benefits: risks, benefits and alternatives were discussed Consent given by: patient Patient identity confirmed: provided demographic data Prepped and Draped in normal sterile fashion Wound explored  Laceration Location: nose  Laceration Length: 1.5cm  No Foreign Bodies seen or palpated  Anesthesia: local infiltration  Local anesthetic: lidocaine 2% without epinephrine  Anesthetic total: 1 ml  Irrigation method: syringe Amount of cleaning: standard  Skin closure: 6-0 nylon  Number of sutures: 3  Technique: simple interrupted  Patient tolerance: Patient tolerated the procedure well with no immediate complications.    MDM  Will prescribe keflex as this looks to be an open nasal fracture.  The ct shows a fracture of the nasal bones, but no intracranial pathology.  He needs to follow up with facial trauma towards the end  of the week.  To return prn.    I personally performed the services described in this documentation, which was scribed in my presence. The recorded information has been reviewed and considered.          Geoffery Lyons, MD 10/16/11 2220

## 2011-10-19 ENCOUNTER — Telehealth (HOSPITAL_BASED_OUTPATIENT_CLINIC_OR_DEPARTMENT_OTHER): Payer: Self-pay | Admitting: *Deleted

## 2012-08-29 ENCOUNTER — Telehealth: Payer: Self-pay | Admitting: *Deleted

## 2012-08-29 NOTE — Telephone Encounter (Signed)
Called lvm asking the patients poa to return my call

## 2012-08-29 NOTE — Telephone Encounter (Signed)
Message copied by Salome Spotted on Wed Aug 29, 2012  1:22 PM ------      Message from: Philipp Ovens R      Created: Wed Aug 29, 2012  9:11 AM      Contact: mark pearce       His POA and wants some insight or resources on which longterm care option would be suitable for pt. Please call him to and direct him.  ------

## 2012-08-29 NOTE — Telephone Encounter (Signed)
Message copied by Salome Spotted on Wed Aug 29, 2012  1:20 PM ------      Message from: Philipp Ovens R      Created: Wed Aug 29, 2012  9:11 AM      Contact: mark pearce       His POA and wants some insight or resources on which longterm care option would be suitable for pt. Please call him to and direct him.  ------

## 2014-12-01 ENCOUNTER — Telehealth: Payer: Self-pay

## 2014-12-01 ENCOUNTER — Encounter (HOSPITAL_BASED_OUTPATIENT_CLINIC_OR_DEPARTMENT_OTHER): Payer: Self-pay

## 2014-12-01 ENCOUNTER — Emergency Department (HOSPITAL_BASED_OUTPATIENT_CLINIC_OR_DEPARTMENT_OTHER)
Admission: EM | Admit: 2014-12-01 | Discharge: 2014-12-01 | Disposition: A | Discharge status: Home / Self Care | Payer: Medicare PPO | Attending: Emergency Medicine | Admitting: Emergency Medicine

## 2014-12-01 ENCOUNTER — Encounter (INDEPENDENT_AMBULATORY_CARE_PROVIDER_SITE_OTHER): Payer: Medicare PPO | Admitting: Ophthalmology

## 2014-12-01 DIAGNOSIS — I10 Essential (primary) hypertension: Secondary | ICD-10-CM

## 2014-12-01 DIAGNOSIS — R410 Disorientation, unspecified: Secondary | ICD-10-CM | POA: Insufficient documentation

## 2014-12-01 DIAGNOSIS — H5461 Unqualified visual loss, right eye, normal vision left eye: Secondary | ICD-10-CM | POA: Diagnosis not present

## 2014-12-01 DIAGNOSIS — Z880 Allergy status to penicillin: Secondary | ICD-10-CM | POA: Insufficient documentation

## 2014-12-01 DIAGNOSIS — H269 Unspecified cataract: Secondary | ICD-10-CM | POA: Diagnosis not present

## 2014-12-01 DIAGNOSIS — H3411 Central retinal artery occlusion, right eye: Secondary | ICD-10-CM | POA: Diagnosis not present

## 2014-12-01 DIAGNOSIS — Z8546 Personal history of malignant neoplasm of prostate: Secondary | ICD-10-CM | POA: Diagnosis not present

## 2014-12-01 DIAGNOSIS — H43813 Vitreous degeneration, bilateral: Secondary | ICD-10-CM | POA: Diagnosis not present

## 2014-12-01 DIAGNOSIS — H338 Other retinal detachments: Secondary | ICD-10-CM | POA: Diagnosis not present

## 2014-12-01 DIAGNOSIS — H35033 Hypertensive retinopathy, bilateral: Secondary | ICD-10-CM

## 2014-12-01 DIAGNOSIS — Z8639 Personal history of other endocrine, nutritional and metabolic disease: Secondary | ICD-10-CM | POA: Insufficient documentation

## 2014-12-01 DIAGNOSIS — H53131 Sudden visual loss, right eye: Secondary | ICD-10-CM

## 2014-12-01 HISTORY — DX: Serous retinal detachment, unspecified eye: H33.20

## 2014-12-01 HISTORY — DX: Malignant neoplasm of prostate: C61

## 2014-12-01 NOTE — ED Provider Notes (Signed)
CSN: [id number]     Arrival date & time 12/01/14  1140 History   First MD Initiated Contact with Patient 12/01/14 1152     Chief Complaint  Patient presents with  . Loss of Vision     (Consider location/radiation/quality/duration/timing/severity/associated sxs/prior Treatment) HPI Comments: Pt. Is a 81 y/o M with hx of HTN, HLD, Retinal Detachment. He presents with acute loss of vision that he noted upon waking up this morning. He describes it as initially a very bright light in his right eye. He says he then noted a grayish light in his right eye, but otherwise cannot see at all. He says he does not have any pain in his eye, he has not felt any pressure, he has not had any floaters. He has never had this before. He says the vision loss did not occur in one direction or another. He has not had any dizziness, numbness, tingling, loss of balance. He has not had any temporal pain, facial numbness, weakness, or difficulty speaking. He has conjugate gaze, and is able to track as long as his left eye is open. He has not had any headaches, nausea, or vomiting.   The history is provided by the patient.    Past Medical History  Diagnosis Date  . Retinal detachment   . Prostate cancer   . Cataract   . Memory loss    Past Surgical History  Procedure Laterality Date  . Prostate surgery     No family history on file. History  Substance Use Topics  . Smoking status: Never Smoker   . Smokeless tobacco: Not on file  . Alcohol Use: No    Review of Systems  Constitutional: Negative.  Negative for fever, chills, diaphoresis, activity change, appetite change, fatigue and unexpected weight change.  HENT: Negative.  Negative for congestion, facial swelling, hearing loss, rhinorrhea, sinus pressure, sore throat, tinnitus and voice change.   Eyes: Negative for photophobia, pain, discharge, redness, itching and visual disturbance.  Respiratory: Negative for cough, chest tightness and shortness of  breath.   Cardiovascular: Negative for chest pain, palpitations and leg swelling.  Gastrointestinal: Negative.  Negative for nausea, vomiting, abdominal pain and diarrhea.  Endocrine: Negative.  Negative for polydipsia and polyuria.  Genitourinary: Negative.  Negative for urgency and frequency.  Musculoskeletal: Negative.  Negative for myalgias, back pain, neck pain and neck stiffness.  Skin: Negative.  Negative for pallor and rash.  Allergic/Immunologic: Negative.   Neurological: Negative.  Negative for dizziness, tremors, syncope, facial asymmetry, speech difficulty, weakness, light-headedness, numbness and headaches.  Hematological: Negative.   Psychiatric/Behavioral: Positive for confusion. Negative for agitation.      Allergies  Penicillins  Home Medications   Prior to Admission medications   Not on File   BP 140/73 mmHg  Pulse 80  Temp(Src) 98.7 F (37.1 C) (Oral)  Resp 16  Ht 5\' 1"  (1.549 m)  Wt 125 lb (56.7 kg)  BMI 23.63 kg/m2  SpO2 98% Physical Exam  Constitutional: He is oriented to person, place, and time. He appears well-developed and well-nourished. No distress.  HENT:  Head: Normocephalic and atraumatic.  Eyes: Conjunctivae, EOM and lids are normal. Right eye exhibits no discharge and no exudate. No foreign body present in the right eye. Left eye exhibits no discharge and no exudate. No foreign body present in the left eye. Right conjunctiva is not injected. Right conjunctiva has no hemorrhage. Left conjunctiva is not injected. Left conjunctiva has no hemorrhage. No scleral icterus. Right eye  exhibits normal extraocular motion and no nystagmus. Right pupil is round and reactive. Left pupil is round and reactive. Pupils are unequal.    Visual Acuity 20/50 on the left, but no ability to see / count fingers only several inches from the face on the right.   Neck: Normal range of motion. Neck supple. No JVD present. No tracheal deviation present. No thyromegaly  present.  Cardiovascular: Normal rate, regular rhythm, normal heart sounds and intact distal pulses.  Exam reveals no gallop and no friction rub.   No murmur heard. Pulmonary/Chest: Effort normal and breath sounds normal. No stridor. No respiratory distress. He has no wheezes. He has no rales. He exhibits no tenderness.  Abdominal: Soft. He exhibits no distension and no mass. There is no tenderness. There is no rebound and no guarding.  Musculoskeletal: Normal range of motion. He exhibits no edema or tenderness.  Lymphadenopathy:    He has no cervical adenopathy.  Neurological: He is alert and oriented to person, place, and time. He displays normal reflexes. No cranial nerve deficit.  Skin: Skin is warm and dry. No rash noted. He is not diaphoretic. No erythema. No pallor.  Psychiatric: He has a normal mood and affect. His behavior is normal.    ED Course  Procedures (including critical care time) Labs Review Labs Reviewed - No data to display  Imaging Review No results found.   EKG Interpretation None      MDM   Final diagnoses:  Acute loss of vision, right    Pt. Is a 81 y/o M here with acute vision loss on the right. History of retinal detachment on the left, with concern for retinal detachment on the right at this time vs. Ischemia, vs. Thrombosis. Discussed with Dr. Zigmund Daniel of Ophthalmology. Dr Zigmund Daniel recommended sending the patient straight to his office for evaluation. The patient was instructed of this and agreed to present for a 1:40 appointment (approximately 45 minutes from time of discharge). Patient was transported via his own vehicle to Dr. Zigmund Daniel' office. Plan to discharge with evaluation by Dr. Zigmund Daniel immediately after discharge.     Aquilla Hacker, MD 12/01/14 1417  Orlie Dakin, MD 12/01/14 1725

## 2014-12-01 NOTE — Discharge Instructions (Signed)
Visual Disturbances °You have had a disturbance in your vision. This may be caused by various conditions, such as: °· Migraines. Migraine headaches are often preceded by a disturbance in vision. Blind spots or light flashes are followed by a headache. This type of visual disturbance is temporary. It does not damage the eye. °· Glaucoma. This is caused by increased pressure in the eye. Symptoms include haziness, blurred vision, or seeing rainbow colored circles when looking at bright lights. Partial or complete visual loss can occur. You may or may not experience eye pain. Visual loss may be gradual or sudden and is irreversible. Glaucoma is the leading cause of blindness. °· Retina problems. Vision will be reduced if the retina becomes detached or if there is a circulation problem as with diabetes, high blood pressure, or a mini-stroke. Symptoms include seeing "floaters," flashes of light, or shadows, as if a curtain has fallen over your eye. °· Optic nerve problems. The main nerve in your eye can be damaged by redness, soreness, and swelling (inflammation), poor circulation, drugs, and toxins. °It is very important to have a complete exam done by a specialist to determine the exact cause of your eye problem. The specialist may recommend medicines or surgery, depending on the cause of the problem. This can help prevent further loss of vision or reduce the risk of having a stroke. Contact the caregiver to whom you have been referred and arrange for follow-up care right away. °SEEK IMMEDIATE MEDICAL CARE IF:  °· Your vision gets worse. °· You develop severe headaches. °· You have any weakness or numbness in the face, arms, or legs. °· You have any trouble speaking or walking. °Document Released: 06/02/2004 Document Revised: 07/18/2011 Document Reviewed: 09/23/2009 °[employer]® Patient Information ©2015 [employer]. This information is not intended to replace advice given to you by your health care provider. Make sure  you discuss any questions you have with your health care provider. ° °

## 2014-12-01 NOTE — Progress Notes (Signed)
Called and spoke with pt's POA, Rico Ala for pre-op call. He states that he understands that pt had a stroke in his eye and pt was being referred to a neurologist, he was not informed that pt was having surgery. I called and left a message with Dr. Zigmund Daniel assistant to call me back to verify whether or not pt was having surgery tomorrow.

## 2014-12-01 NOTE — ED Notes (Addendum)
Pt d/c with friend/caregiver. Address given for Dr. Zigmund Daniel, opthamologist. Pt verbalizes understanding to go directly to his office

## 2014-12-01 NOTE — ED Notes (Signed)
Pt and pt's POA states he woke this am with loss of vision to right eye-hx of detached retina 8-9 yrs ago

## 2014-12-01 NOTE — Telephone Encounter (Signed)
I called Rico Ala, patient's POA. I left him a voicemail stating Dr. Jannifer Franklin wanted me to work the patient in at 21 PM tomorrow. I told him I would call him back first thing in the morning to make sure he got the message and that 12 PM was okay for them.

## 2014-12-01 NOTE — ED Notes (Signed)
Jacubowitz MD at bedside.

## 2014-12-01 NOTE — ED Notes (Addendum)
Resident MD at bedside.

## 2014-12-01 NOTE — ED Provider Notes (Addendum)
Level 5caveat dementia. History is taken from patient and from patient's power of attorney who accompanies himPatient awakened this morning unable to see out of his right eye. He mentions that he did see streaks of light this morning.He has no pain.on exam patient is pleasant alert cooperative HEENT exam no facial asymmetry eyes no pain on extraocular movement no redness. He has and a afferent defect to light to right eye. Left eye reacts normally to light.Visual acuity he is unable to finger count with right eye, but he can perceive my hand motion. He is able to finger count easily with left eye.neurologic was all extremity as well no distress cranial nerves II through XII grossly intact with exception of cranial nerve II at right eye I suspect detached retina.Dr,. Zigmund Daniel contacted via telephone and will see see pt immediately upon leaving here   Orlie Dakin, MD 12/01/14 [place], MD 12/01/14 1724

## 2014-12-02 ENCOUNTER — Ambulatory Visit: Payer: Self-pay | Admitting: Neurology

## 2014-12-02 ENCOUNTER — Ambulatory Visit (INDEPENDENT_AMBULATORY_CARE_PROVIDER_SITE_OTHER): Payer: Medicare PPO | Admitting: Neurology

## 2014-12-02 ENCOUNTER — Encounter (HOSPITAL_COMMUNITY): Admission: RE | Payer: Self-pay | Source: Ambulatory Visit

## 2014-12-02 ENCOUNTER — Encounter: Payer: Self-pay | Admitting: Neurology

## 2014-12-02 ENCOUNTER — Ambulatory Visit (HOSPITAL_COMMUNITY): Admission: RE | Admit: 2014-12-02 | Payer: Medicare PPO | Source: Ambulatory Visit | Admitting: Ophthalmology

## 2014-12-02 VITALS — BP 139/82 | HR 75 | Ht 61.0 in | Wt 120.0 lb

## 2014-12-02 DIAGNOSIS — H3411 Central retinal artery occlusion, right eye: Secondary | ICD-10-CM | POA: Diagnosis not present

## 2014-12-02 DIAGNOSIS — H3410 Central retinal artery occlusion, unspecified eye: Secondary | ICD-10-CM

## 2014-12-02 DIAGNOSIS — R413 Other amnesia: Secondary | ICD-10-CM | POA: Insufficient documentation

## 2014-12-02 HISTORY — DX: Central retinal artery occlusion, unspecified eye: H34.10

## 2014-12-02 SURGERY — SCLERAL BUCKLE
Anesthesia: General | Laterality: Right

## 2014-12-02 NOTE — Progress Notes (Signed)
Reason for visit: Right central retinal artery occlusion  Referring physician: Dr. Tempie Hoist  Jimmy Solis is a 81 y.o. male  History of present illness:  Jimmy Solis is an 81 year old right-handed white male with a history of a progressive memory disturbance. The patient has been living alone, with some assistance from his son. The patient went off of all his medications within the last year or 2, including low-dose aspirin and his cholesterol medication. The patient does not operate a motor vehicle. He requires some assistance in keeping up with appointments. The patient noted onset of right sided visual disturbance within the last 24 hours. The patient has no other associated symptoms. He denies any pain around the eye or any headache. He has not had any numbness or weakness of the face, arms, or legs. He denies any speech disturbance or difficulty with balance. He was seen by Dr. Tempie Hoist, and he was felt to have a right central retinal artery occlusion. Massage of the eye did not offer any benefit. He is sent to this office for an evaluation.  Past Medical History  Diagnosis Date  . Retinal detachment     left  . Prostate cancer   . Cataract   . Memory loss   . 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 Memory deficits 12/02/2014    Past Surgical History  Procedure Laterality Date  . Prostate surgery    . Cataract extraction, bilateral    . Retinal detachment surgery      Family History  Problem Relation Age of Onset  . Stroke Mother   . Lung cancer Brother   . Tuberculosis Father     Social history:  reports that he has never smoked. He has never used smokeless tobacco. He reports that he does not drink alcohol or use illicit drugs.  Medications:  Prior to Admission medications   Not on File      Allergies  Allergen Reactions  . Penicillins Hives    ROS:  Out of a complete 14 system review of symptoms, the patient complains only of the  following symptoms, and all other reviewed systems are negative.  Loss of vision Memory loss, confusion  Blood pressure 139/82, pulse 75, height 5\' 1"  (1.549 m), weight 120 lb (54.432 kg).  Physical Exam  General: The patient is alert and cooperative at the time of the examination.  Eyes: Pupils are equal, round, and reactive to light. Discs are flat bilaterally.  Neck: The neck is supple, no carotid bruits are noted.  Respiratory: The respiratory examination is clear.  Cardiovascular: The cardiovascular examination reveals a regular rate and rhythm, no obvious murmurs or rubs are noted.  Skin: Extremities are without significant edema.  Neurologic Exam  Mental status: The patient is alert and oriented x 3 at the time of the examination. The patient has apparent normal recent and remote memory, with an apparently normal attention span and concentration ability.  Cranial nerves: Facial symmetry is present. There is good sensation of the face to pinprick and soft touch bilaterally. The strength of the facial muscles and the muscles to head turning and shoulder shrug are normal bilaterally. Speech is well enunciated, no aphasia or dysarthria is noted. Extraocular movements are full. Visual fields are full with the left eye, the patient is unable to count fingers with the right eye. The tongue is midline, and the patient has symmetric elevation of the soft palate. No obvious hearing deficits are noted.  Motor: The motor testing reveals 5 over 5 strength of all 4 extremities. Good symmetric motor tone is noted throughout.  Sensory: Sensory testing is intact to pinprick, soft touch, vibration sensation, and position sense on all 4 extremities. No evidence of extinction is noted.  Coordination: Cerebellar testing reveals good finger-nose-finger and heel-to-shin bilaterally, but there is some apraxia with the use of the lower extremities..  Gait and station: Gait is normal. Tandem gait is  unsteady. Romberg is negative. No drift is seen.  Reflexes: Deep tendon reflexes are symmetric and normal bilaterally. Toes are downgoing bilaterally.   MRI brain 07/09/2012:  Impression: Abnormal MRI brain (without contrast) demonstrating: 1. 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 Moderate ventriculomegaly on ex vacuo basis. 2. Moderate-severe periventricular, subcortical and pontine chronic small vessel ischemic disease.   Assessment/Plan:  1. Right central retinal artery occlusion  2. Progressive memory disturbance  The patient has had MRI evaluation of the brain in 2014 that reveals evidence of small vessel ischemic changes. He will be set up for MRI of the brain at this time, and undergo a carotid Doppler study. He is to go on low-dose aspirin. Blood work has been done previously that reveals a sedimentation rate of 22, which is minimally elevated. I believe that temporal arteritis is unlikely. A CBC was relatively unremarkable. He will follow-up in 3 months. In the future, his memory issues will need to be addressed, we may consider medications for memory.  Jill Alexanders MD 12/02/2014 7:42 PM  [facility] [address] [place], [place] [address]  Phone [phone] Fax [phone]

## 2014-12-02 NOTE — Telephone Encounter (Signed)
I called Jimmy Solis and confirmed that the patient will be able to come today at 12 for an appointment.

## 2014-12-02 NOTE — Patient Instructions (Addendum)
We will check MRI evaluation of the brain, and set you up for a carotid Doppler study to look at the circulation to the brain. He will go back on aspirin taking a baby aspirin daily. We will follow-up in 3-4 months. If any new symptoms occur with weakness, speech changes, or balance problems, go to the emergency room immediately.   Central Retinal Artery Occlusion Central retinal artery occlusion is when the artery that supplies blood to the retina is blocked. The retina is the layer of cells at the back of the eye that let us see light and color. These cells send light images to the brain by way of the optic nerve. The optic nerve connects directly to the brain from the back of the eye. If the central retinal artery is blocked, cells in the retina needed for vision die. A central retinal artery occlusion is a stroke of the retina and usually results in blindness of the affected eye. About 14% of people have a small extra artery (cilioretinal artery) coming off the central retinal artery and supplies blood to a key part of the retina. These people are very lucky if they have a central retinal artery occlusion because they may retain some vision. CAUSES  There are many different things that could cause the blockage such as:  Hardening of the arteries (arteriosclerosis).  High blood pressure.  High pressure in the eye from long occurring (chronic) or sudden (acute) glaucoma.  Swelling of the optic nerve. This can happen from inflammation of the nerve itself or from high pressure inside the head around the brain.  A small piece of fat traveling in the blood stream (fat embolus).  A blood clot in the artery.  A condition called arteritis (a response to injury or infection by an artery).  Migraine headache. This condition is more likely to happen in someone with:  Diabetes.  High blood pressure.  An abnormal heart rhythm.  Certain types of arthritis.  Certain blood  conditions.  Certain heart valve problems.  Narrowing of the main arteries in the neck that supply blood to the brain (the carotid arteries). SYMPTOMS   Sudden, painless, total loss of vision in one eye. There are no symptoms before it happens.  If only a branch of this artery is blocked, there may be partial loss of vision coming from the area of the retina that is not receiving blood. DIAGNOSIS  An ophthalmologist can usually diagnose this condition by examining the inside of the eye. Some of the usual findings of swelling only appear for a short time. By the time they go away, the retinal cells are damaged for good and cannot be restored.  TREATMENT  In very rare cases, sudden lowering of pressure in the eye by using medicines and actually draining some of the eye fluids can restore blood flow. This is rarely successful. Treatments that may be used are:  Medicines that lower the pressure inside the eye.  Medicines that can improve blood supply to the retina.  Oxygen with a small amount of carbon dioxide. It is important to get as much oxygen as possible to the retina. The carbon dioxide can help open blood vessels and let more oxygen and blood get to the retina. SEEK IMMEDIATE MEDICAL CARE IF:  You suddenly have a total loss of vision in one eye. It may not be possible to restore the vision that has been lost. However, it is important to know what the cause of the central  retinal artery occlusion was so that any underlying disease can be treated right away. Document Released: 08/07/2000 Document Revised: 09/09/2013 Document Reviewed: 06/11/2007 [facility] Patient Information 2015 [place], [place]. This information is not intended to replace advice given to you by your health care provider. Make sure you discuss any questions you have with your health care provider.

## 2014-12-17 ENCOUNTER — Ambulatory Visit
Admission: RE | Admit: 2014-12-17 | Discharge: 2014-12-17 | Disposition: A | Discharge status: Home / Self Care | Payer: Medicare PPO | Source: Ambulatory Visit | Attending: Neurology | Admitting: Neurology

## 2014-12-17 ENCOUNTER — Ambulatory Visit (INDEPENDENT_AMBULATORY_CARE_PROVIDER_SITE_OTHER): Payer: Medicare PPO

## 2014-12-17 DIAGNOSIS — R413 Other amnesia: Secondary | ICD-10-CM

## 2014-12-17 DIAGNOSIS — H3411 Central retinal artery occlusion, right eye: Secondary | ICD-10-CM | POA: Diagnosis not present

## 2014-12-18 ENCOUNTER — Telehealth: Payer: Self-pay | Admitting: Neurology

## 2014-12-18 NOTE — Telephone Encounter (Signed)
I called patient, talk with the son. The MRI the brain shows extensive small vessel ischemic changes. There is atrophy as well. The patient does not have a strong history for hypertension. He is to stay on aspirin, the carotid Doppler study has been done, I do not yet have the results of the study.     MRI brain 12/18/2014:   IMPRESSION:  Abnormal MRI brain (without) demonstrating: 1. Moderate-severe periventricular and subcortical and pontine chronic small vessel ischemic disease.  2. Chronic cerebral microhemorrhages in the left anterior temporal and left cerebellar regions. 3. Mild diffuse and severe mesial temporal atrophy. 4. No acute findings.

## 2014-12-26 ENCOUNTER — Telehealth: Payer: Self-pay | Admitting: Neurology

## 2014-12-26 NOTE — Telephone Encounter (Signed)
I called the son. The doppler study shows 50 to 69% stenosis of the right mid ICA. We will need to follow over time. Re check in 1 year.

## 2015-01-02 ENCOUNTER — Encounter (INDEPENDENT_AMBULATORY_CARE_PROVIDER_SITE_OTHER): Payer: Medicare PPO | Admitting: Ophthalmology

## 2015-01-02 DIAGNOSIS — H35033 Hypertensive retinopathy, bilateral: Secondary | ICD-10-CM

## 2015-01-02 DIAGNOSIS — H338 Other retinal detachments: Secondary | ICD-10-CM | POA: Diagnosis not present

## 2015-01-02 DIAGNOSIS — H43813 Vitreous degeneration, bilateral: Secondary | ICD-10-CM

## 2015-01-02 DIAGNOSIS — H43811 Vitreous degeneration, right eye: Secondary | ICD-10-CM

## 2015-01-02 DIAGNOSIS — I10 Essential (primary) hypertension: Secondary | ICD-10-CM | POA: Diagnosis not present

## 2015-01-16 ENCOUNTER — Encounter (HOSPITAL_BASED_OUTPATIENT_CLINIC_OR_DEPARTMENT_OTHER): Payer: Self-pay | Admitting: Emergency Medicine

## 2015-01-16 ENCOUNTER — Inpatient Hospital Stay (HOSPITAL_BASED_OUTPATIENT_CLINIC_OR_DEPARTMENT_OTHER)
Admission: EM | Admit: 2015-01-16 | Discharge: 2015-01-23 | DRG: 557 | Disposition: A | Discharge status: Skilled Nursing Facility | Payer: Medicare PPO | Attending: Internal Medicine | Admitting: Internal Medicine

## 2015-01-16 ENCOUNTER — Emergency Department (HOSPITAL_BASED_OUTPATIENT_CLINIC_OR_DEPARTMENT_OTHER): Payer: Medicare PPO

## 2015-01-16 DIAGNOSIS — Z880 Allergy status to penicillin: Secondary | ICD-10-CM

## 2015-01-16 DIAGNOSIS — Z8546 Personal history of malignant neoplasm of prostate: Secondary | ICD-10-CM

## 2015-01-16 DIAGNOSIS — Z66 Do not resuscitate: Secondary | ICD-10-CM | POA: Diagnosis not present

## 2015-01-16 DIAGNOSIS — W1830XA Fall on same level, unspecified, initial encounter: Secondary | ICD-10-CM | POA: Diagnosis present

## 2015-01-16 DIAGNOSIS — R410 Disorientation, unspecified: Secondary | ICD-10-CM | POA: Diagnosis present

## 2015-01-16 DIAGNOSIS — F0280 Dementia in other diseases classified elsewhere without behavioral disturbance: Secondary | ICD-10-CM | POA: Diagnosis present

## 2015-01-16 DIAGNOSIS — E876 Hypokalemia: Secondary | ICD-10-CM | POA: Diagnosis not present

## 2015-01-16 DIAGNOSIS — H3320 Serous retinal detachment, unspecified eye: Secondary | ICD-10-CM | POA: Diagnosis present

## 2015-01-16 DIAGNOSIS — W19XXXA Unspecified fall, initial encounter: Secondary | ICD-10-CM | POA: Insufficient documentation

## 2015-01-16 DIAGNOSIS — R7989 Other specified abnormal findings of blood chemistry: Secondary | ICD-10-CM | POA: Diagnosis not present

## 2015-01-16 DIAGNOSIS — Z7982 Long term (current) use of aspirin: Secondary | ICD-10-CM

## 2015-01-16 DIAGNOSIS — A419 Sepsis, unspecified organism: Secondary | ICD-10-CM | POA: Diagnosis not present

## 2015-01-16 DIAGNOSIS — G309 Alzheimer's disease, unspecified: Secondary | ICD-10-CM | POA: Diagnosis present

## 2015-01-16 DIAGNOSIS — M6282 Rhabdomyolysis: Secondary | ICD-10-CM | POA: Diagnosis not present

## 2015-01-16 DIAGNOSIS — J690 Pneumonitis due to inhalation of food and vomit: Secondary | ICD-10-CM

## 2015-01-16 DIAGNOSIS — R1311 Dysphagia, oral phase: Secondary | ICD-10-CM | POA: Diagnosis present

## 2015-01-16 DIAGNOSIS — R1313 Dysphagia, pharyngeal phase: Secondary | ICD-10-CM | POA: Diagnosis present

## 2015-01-16 DIAGNOSIS — Y92002 Bathroom of unspecified non-institutional (private) residence single-family (private) house as the place of occurrence of the external cause: Secondary | ICD-10-CM

## 2015-01-16 DIAGNOSIS — I10 Essential (primary) hypertension: Secondary | ICD-10-CM | POA: Diagnosis present

## 2015-01-16 DIAGNOSIS — F0390 Unspecified dementia without behavioral disturbance: Secondary | ICD-10-CM | POA: Diagnosis not present

## 2015-01-16 DIAGNOSIS — R1310 Dysphagia, unspecified: Secondary | ICD-10-CM | POA: Insufficient documentation

## 2015-01-16 DIAGNOSIS — L8990 Pressure ulcer of unspecified site, unspecified stage: Secondary | ICD-10-CM | POA: Insufficient documentation

## 2015-01-16 DIAGNOSIS — T17908A Unspecified foreign body in respiratory tract, part unspecified causing other injury, initial encounter: Secondary | ICD-10-CM

## 2015-01-16 HISTORY — DX: Depression, unspecified: F32.A

## 2015-01-16 HISTORY — DX: Blindness, one eye, unspecified eye: H54.40

## 2015-01-16 HISTORY — DX: Transient cerebral ischemic attack, unspecified: G45.9

## 2015-01-16 HISTORY — DX: Alzheimer's disease, unspecified: G30.9

## 2015-01-16 HISTORY — DX: Dementia in other diseases classified elsewhere, unspecified severity, without behavioral disturbance, psychotic disturbance, mood disturbance, and anxiety: F02.80

## 2015-01-16 HISTORY — DX: Major depressive disorder, single episode, unspecified: F32.9

## 2015-01-16 HISTORY — DX: Unspecified malignant neoplasm of skin of other part of trunk: C44.509

## 2015-01-16 HISTORY — DX: Repeated falls: R29.6

## 2015-01-16 HISTORY — DX: Cerebral infarction, unspecified: I63.9

## 2015-01-16 LAB — BASIC METABOLIC PANEL
Anion gap: 11 (ref 5–15)
BUN: 25 mg/dL — ABNORMAL HIGH (ref 6–20)
CHLORIDE: 101 mmol/L (ref 101–111)
CO2: 29 mmol/L (ref 22–32)
Calcium: 9.7 mg/dL (ref 8.9–10.3)
Creatinine, Ser: 1.06 mg/dL (ref 0.61–1.24)
GFR calc non Af Amer: >60 mL/min (ref >60)
Glucose, Bld: 118 mg/dL — ABNORMAL HIGH (ref 65–99)
Potassium: 3.8 mmol/L (ref 3.5–5.1)
SODIUM: 141 mmol/L (ref 135–145)

## 2015-01-16 LAB — CBC WITH DIFFERENTIAL/PLATELET
Basophils Absolute: 0 10*3/uL (ref 0.0–0.1)
Basophils Relative: 0 % (ref 0–1)
EOS ABS: 0 10*3/uL (ref 0.0–0.7)
Eosinophils Relative: 0 % (ref 0–5)
HCT: 44.3 % (ref 39.0–52.0)
HEMOGLOBIN: 15.2 g/dL (ref 13.0–17.0)
LYMPHS ABS: 1.1 10*3/uL (ref 0.7–4.0)
Lymphocytes Relative: 9 % — ABNORMAL LOW (ref 12–46)
MCH: 33.9 pg (ref 26.0–34.0)
MCHC: 34.3 g/dL (ref 30.0–36.0)
MCV: 98.7 fL (ref 78.0–100.0)
Monocytes Absolute: 1.2 10*3/uL — ABNORMAL HIGH (ref 0.1–1.0)
Monocytes Relative: 10 % (ref 3–12)
NEUTROS ABS: 9.9 10*3/uL — AB (ref 1.7–7.7)
NEUTROS PCT: 81 % — AB (ref 43–77)
Platelets: 221 10*3/uL (ref 150–400)
RBC: 4.49 MIL/uL (ref 4.22–5.81)
RDW: 14 % (ref 11.5–15.5)
WBC: 12.1 10*3/uL — AB (ref 4.0–10.5)

## 2015-01-16 LAB — URINE MICROSCOPIC-ADD ON

## 2015-01-16 LAB — URINALYSIS, ROUTINE W REFLEX MICROSCOPIC
Bilirubin Urine: NEGATIVE
Glucose, UA: NEGATIVE mg/dL
Ketones, ur: 15 mg/dL — AB
LEUKOCYTES UA: NEGATIVE
NITRITE: NEGATIVE
PROTEIN: 100 mg/dL — AB
Specific Gravity, Urine: 1.02 (ref 1.005–1.030)
UROBILINOGEN UA: 0.2 mg/dL (ref 0.0–1.0)
pH: 5.5 (ref 5.0–8.0)

## 2015-01-16 LAB — CK: CK TOTAL: 21186 U/L — AB (ref 49–397)

## 2015-01-16 LAB — TROPONIN I: TROPONIN I: 0.09 ng/mL — AB (ref <0.031)

## 2015-01-16 MED ORDER — SODIUM CHLORIDE 0.9 % IV BOLUS (SEPSIS)
500.0000 mL | Freq: Once | INTRAVENOUS | Status: AC
  Administered 2015-01-16: 500 mL via INTRAVENOUS

## 2015-01-16 MED ORDER — LORAZEPAM 2 MG/ML IJ SOLN
INTRAMUSCULAR | Status: AC
  Administered 2015-01-16: 0.5 mg
  Filled 2015-01-16: qty 1

## 2015-01-16 MED ORDER — SODIUM CHLORIDE 0.9 % IV SOLN
Freq: Once | INTRAVENOUS | Status: AC
  Administered 2015-01-16: 15:00:00 via INTRAVENOUS

## 2015-01-16 MED ORDER — HEPARIN SODIUM (PORCINE) 5000 UNIT/ML IJ SOLN
5000.0000 [IU] | Freq: Three times a day (TID) | INTRAMUSCULAR | Status: DC
  Administered 2015-01-16 – 2015-01-23 (×21): 5000 [IU] via SUBCUTANEOUS
  Filled 2015-01-16 (×21): qty 1

## 2015-01-16 MED ORDER — SODIUM CHLORIDE 0.9 % IV SOLN
INTRAVENOUS | Status: DC
Start: 2015-01-16 — End: 2015-01-22
  Administered 2015-01-16 – 2015-01-21 (×9): via INTRAVENOUS

## 2015-01-16 NOTE — ED Notes (Signed)
Neighbor found pt in bathroom floor

## 2015-01-16 NOTE — Progress Notes (Signed)
Report received from [place], [place] for admission to [phone]

## 2015-01-16 NOTE — H&P (Signed)
[facility] History and Physical  Jimmy Solis ASN:[id number] DOB: [birth year] DOA: 01/16/2015  Referring physician: EDP PCP: [name], PA-C   Chief Complaint: Found down   HPI: Jimmy Solis is a 81 y.o. male with h/o alzheimer's disease, lives alone at baseline.  Patient's POA found him down at home in the bathroom for unknown length of time.  Patient dosent remember when he fell which is his baseline memory wise.  Most likely happened in the past 24-48 hour time window (probably latter because of the number of ensures that were left in his fridge per his POA.  Review of Systems: Systems reviewed.  As above, otherwise negative  Past Medical History  Diagnosis Date  . Retinal detachment     left  . Prostate cancer   . Cataract   . Memory loss   . 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 Memory deficits 12/02/2014   Past Surgical History  Procedure Laterality Date  . Prostate surgery    . Cataract extraction, bilateral    . Retinal detachment surgery     Social History:  reports that he has never smoked. He has never used smokeless tobacco. He reports that he does not drink alcohol or use illicit drugs.  Allergies  Allergen Reactions  . Penicillins Hives  . Ativan [Lorazepam] Other (See Comments)    Hallucination, did calm patient down but he became confused / delirious after getting this (delerium may have already been going on before ativan was given, and ativan may have actually converted him from agitated delirium to non-agitated delirium)    Family History  Problem Relation Age of Onset  . Stroke Mother   . Lung cancer Brother   . Tuberculosis Father      Prior to Admission medications   Medication Sig Start Date End Date Taking? Authorizing Provider  aspirin 81 MG tablet Take 81 mg by mouth daily.   Yes Historical Provider, MD   Physical Exam: Filed Vitals:   01/16/15 2009  BP: 185/78  Pulse: 75  Temp:   Resp: 18    BP 185/78  mmHg  Pulse 75  Temp(Src) 98.8 F (37.1 C) (Oral)  Resp 18  SpO2 99%  General Appearance:    Alert, pleasantly confused, no distress, appears stated age  Head:    Normocephalic, atraumatic  Eyes:    PERRL, EOMI, sclera non-icteric        Nose:   Nares without drainage or epistaxis. Mucosa, turbinates normal  Throat:   Moist mucous membranes. Oropharynx without erythema or exudate.  Neck:   Supple. No carotid bruits.  No thyromegaly.  No lymphadenopathy.   Back:     No CVA tenderness, no spinal tenderness  Lungs:     Clear to auscultation bilaterally, without wheezes, rhonchi or rales  Chest wall:    No tenderness to palpitation  Heart:    Regular rate and rhythm without murmurs, gallops, rubs  Abdomen:     Soft, non-tender, nondistended, normal bowel sounds, no organomegaly  Genitalia:    deferred  Rectal:    deferred  Extremities:   No clubbing, cyanosis or edema.  Pulses:   2+ and symmetric all extremities  Skin:   Skin color, texture, turgor normal, no rashes or lesions  Lymph nodes:   Cervical, supraclavicular, and axillary nodes normal  Neurologic:   CNII-XII intact. Normal strength, sensation and reflexes      throughout    Labs on Admission:  Basic Metabolic  Panel:  Recent Labs Lab 01/16/15 1250  NA 141  K 3.8  CL 101  CO2 29  GLUCOSE 118*  BUN 25*  CREATININE 1.06  CALCIUM 9.7   Liver Function Tests: No results for input(s): AST, ALT, ALKPHOS, BILITOT, PROT, ALBUMIN in the last 168 hours. No results for input(s): LIPASE, AMYLASE in the last 168 hours. No results for input(s): AMMONIA in the last 168 hours. CBC:  Recent Labs Lab 01/16/15 1250  WBC 12.1*  NEUTROABS 9.9*  HGB 15.2  HCT 44.3  MCV 98.7  PLT 221   Cardiac Enzymes:  Recent Labs Lab 01/16/15 1250  CKTOTAL 99371*  TROPONINI 0.09*    BNP (last 3 results) No results for input(s): PROBNP in the last 8760 hours. CBG: No results for input(s): GLUCAP in the last 168  hours.  Radiological Exams on Admission: Dg Shoulder Right  01/16/2015   CLINICAL DATA:  Status post fall in bathroom today with a blow to the right shoulder. Pain. Initial encounter.  EXAM: RIGHT SHOULDER - 2+ VIEW  COMPARISON:  None.  FINDINGS: There is no evidence of fracture or dislocation. There is no evidence of arthropathy or other focal bone abnormality. Soft tissues are unremarkable.  IMPRESSION: Negative exam.   Electronically Signed   By: Inge Rise M.D.   On: 01/16/2015 11:38   Ct Head Wo Contrast  01/16/2015   CLINICAL DATA:  Found down, unresponsive  EXAM: CT HEAD WITHOUT CONTRAST  CT CERVICAL SPINE WITHOUT CONTRAST  TECHNIQUE: Multidetector CT imaging of the head and cervical spine was performed following the standard protocol without intravenous contrast. Multiplanar CT image reconstructions of the cervical spine were also generated.  COMPARISON:  Head CT 10/16/2011, brain MRI images only 12/17/2014  FINDINGS: CT HEAD FINDINGS  Evidence of left scleral banding. Mild cortical volume loss noted with proportional ventricular prominence. Areas of periventricular white matter hypodensity are most compatible with small vessel ischemic change. No acute hemorrhage, infarct, or mass lesion is identified. No midline shift. Orbits and paranasal sinuses otherwise intact. No skull fracture.  CT CERVICAL SPINE FINDINGS  C1 through the cervicothoracic junction is visualized in its entirety. Moderate multilevel disc degenerative change noted with bilateral neural foraminal narrowing by uncovertebral and hypertrophy, most prominent at C5-C6 and C3-C4. No fracture or dislocation. Multilevel facet osteoarthritic change. Vascular calcifications. Lung apices are grossly clear.  IMPRESSION: No acute intracranial abnormality.  Chronic changes as above.  No cervical spine fracture or dislocation.   Electronically Signed   By: Conchita Paris M.D.   On: 01/16/2015 12:46   Ct Cervical Spine Wo  Contrast  01/16/2015   CLINICAL DATA:  Found down, unresponsive  EXAM: CT HEAD WITHOUT CONTRAST  CT CERVICAL SPINE WITHOUT CONTRAST  TECHNIQUE: Multidetector CT imaging of the head and cervical spine was performed following the standard protocol without intravenous contrast. Multiplanar CT image reconstructions of the cervical spine were also generated.  COMPARISON:  Head CT 10/16/2011, brain MRI images only 12/17/2014  FINDINGS: CT HEAD FINDINGS  Evidence of left scleral banding. Mild cortical volume loss noted with proportional ventricular prominence. Areas of periventricular white matter hypodensity are most compatible with small vessel ischemic change. No acute hemorrhage, infarct, or mass lesion is identified. No midline shift. Orbits and paranasal sinuses otherwise intact. No skull fracture.  CT CERVICAL SPINE FINDINGS  C1 through the cervicothoracic junction is visualized in its entirety. Moderate multilevel disc degenerative change noted with bilateral neural foraminal narrowing by uncovertebral and hypertrophy, most prominent  at C5-C6 and C3-C4. No fracture or dislocation. Multilevel facet osteoarthritic change. Vascular calcifications. Lung apices are grossly clear.  IMPRESSION: No acute intracranial abnormality.  Chronic changes as above.  No cervical spine fracture or dislocation.   Electronically Signed   By: Conchita Paris M.D.   On: 01/16/2015 12:46    EKG: Independently reviewed.  Assessment/Plan Principal Problem:   Rhabdomyolysis Active Problems:   Delirium   1. Rhabdomyolysis - 1. Non-traumatic rhabdo 2. IVF at 150 cc/hr, got 1.5L bolus in ED 3. Recheck CPK in AM 4. Recheck BMP in AM 2. Delirium - 1. Could be due to rhabdo 2. UA is also pending to r/o UTI    Code Status: Full Code  Family Communication: POA at bedside Disposition Plan: Admit to inpatient   Time spent: 70 min  GARDNER, JARED M. [facility] Pager [phone]  If 7AM-7PM, please contact the day  team taking care of the patient [email or web address] Password [email or web address] 01/16/2015, 9:02 PM

## 2015-01-16 NOTE — ED Notes (Signed)
Jimmy Solis cell phone [phone] please call for questions. Elta Guadeloupe is patients POA/friend that cares for him.

## 2015-01-16 NOTE — ED Notes (Signed)
[facility] is aware of room 5W09 at [facility] in transport mode.

## 2015-01-16 NOTE — ED Notes (Signed)
[facility] is transporting patient to room 5W09 at [facility]

## 2015-01-16 NOTE — ED Provider Notes (Signed)
CSN: [id number]     Arrival date & time 01/16/15  1033 History   First MD Initiated Contact with Patient 01/16/15 1139     Chief Complaint  Patient presents with  . Fall     (Consider location/radiation/quality/duration/timing/severity/associated sxs/prior Treatment) HPI Comments: 81 yo male who fell sometime between yesterday and this morning.  A friend came to check on him and found him on the floor in the bathroom, next to the toilet.  Pt does not remember when he fell, which apparently is his memory baseline.  His friend had a hard time getting him off the floor due to right shoulder and right hip pain.  Now, pt states shoulder pain is better but still present.  The hip pain has completely resolved.    Level V Caveat secondary to dementia/memory loss.  Patient is a 81 y.o. male presenting with fall.  Fall This is a recurrent problem. Episode onset: unknown. The problem occurs constantly. The problem has been resolved. Associated symptoms comments: Right shoulder pain..    Past Medical History  Diagnosis Date  . Retinal detachment     left  . Prostate cancer   . Cataract   . Memory loss   . 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 Memory deficits 12/02/2014   Past Surgical History  Procedure Laterality Date  . Prostate surgery    . Cataract extraction, bilateral    . Retinal detachment surgery     Family History  Problem Relation Age of Onset  . Stroke Mother   . Lung cancer Brother   . Tuberculosis Father    Social History  Substance Use Topics  . Smoking status: Never Smoker   . Smokeless tobacco: Never Used  . Alcohol Use: No    Review of Systems  Unable to perform ROS: Dementia      Allergies  Penicillins  Home Medications   Prior to Admission medications   Medication Sig Start Date End Date Taking? Authorizing Provider  aspirin 81 MG tablet Take 81 mg by mouth daily.    Historical Provider, MD   BP 132/67 mmHg  Pulse 83  Temp(Src)  98.9 F (37.2 C) (Oral)  Resp 16  SpO2 98% Physical Exam  Constitutional: He is oriented to person, place, and time. He appears well-developed and well-nourished. No distress.  HENT:  Head: Normocephalic and atraumatic. Head is without raccoon's eyes and without Battle's sign.    Nose: Nose normal.  Eyes: Conjunctivae and EOM are normal. Pupils are equal, round, and reactive to light. No scleral icterus.  Neck: No spinous process tenderness and no muscular tenderness present.  Cardiovascular: Normal rate, regular rhythm, normal heart sounds and intact distal pulses.   No murmur heard. Pulmonary/Chest: Effort normal and breath sounds normal. He has no rales. He exhibits no tenderness.  Abdominal: Soft. There is no tenderness. There is no rebound and no guarding.  Musculoskeletal: He exhibits no edema.       Right shoulder: He exhibits decreased range of motion (secondary to pain), tenderness and bony tenderness. He exhibits no deformity, normal pulse and normal strength.       Right hip: He exhibits normal range of motion, normal strength, no tenderness, no bony tenderness, no crepitus and no deformity.       Left hip: He exhibits normal range of motion, normal strength, no tenderness, no bony tenderness, no crepitus and no deformity.       Thoracic back: He exhibits no tenderness and no  bony tenderness.       Lumbar back: He exhibits no tenderness and no bony tenderness.  No evidence of trauma to extremities, except as noted.  2+ distal pulses.    Neurological: He is alert and oriented to person, place, and time.  Skin: Skin is warm and dry. No rash noted.  Psychiatric: He has a normal mood and affect.  Nursing note and vitals reviewed.   ED Course  Procedures (including critical care time) Labs Review Labs Reviewed  CBC WITH DIFFERENTIAL/PLATELET - Abnormal; Notable for the following:    WBC 12.1 (*)    Neutrophils Relative % 81 (*)    Neutro Abs 9.9 (*)    Lymphocytes Relative  9 (*)    Monocytes Absolute 1.2 (*)    All other components within normal limits  BASIC METABOLIC PANEL - Abnormal; Notable for the following:    Glucose, Bld 118 (*)    BUN 25 (*)    All other components within normal limits  TROPONIN I - Abnormal; Notable for the following:    Troponin I 0.09 (*)    All other components within normal limits  CK - Abnormal; Notable for the following:    Total CK 21186 (*)    All other components within normal limits  URINALYSIS, ROUTINE W REFLEX MICROSCOPIC (NOT AT [facility])    Imaging Review Dg Shoulder Right  01/16/2015   CLINICAL DATA:  Status post fall in bathroom today with a blow to the right shoulder. Pain. Initial encounter.  EXAM: RIGHT SHOULDER - 2+ VIEW  COMPARISON:  None.  FINDINGS: There is no evidence of fracture or dislocation. There is no evidence of arthropathy or other focal bone abnormality. Soft tissues are unremarkable.  IMPRESSION: Negative exam.   Electronically Signed   By: Inge Rise M.D.   On: 01/16/2015 11:38   I have personally reviewed and evaluated these images and lab results as part of my medical decision-making.   EKG Interpretation   Date/Time:  Friday January 16 2015 13:55:46 EDT Ventricular Rate:  77 PR Interval:  212 QRS Duration: 88 QT Interval:  400 QTC Calculation: 452 R Axis:   -19 Text Interpretation:  Sinus rhythm with 1st degree A-V block Otherwise  normal ECG No significant change was found Confirmed by [facility]  MD, TREY  ([id number]) on 01/16/2015 2:35:55 PM      MDM   Final diagnoses:  Non-traumatic rhabdomyolysis  Fall, initial encounter    Labs indicative of acute rhabdo.  His friend thinks he may have been on the bathroom floor since yesterday afternoon.  (his normal evening Boost drink was still in the fridge this morning).  He has started to become somewhat confused and is hallucinating.  Will need fluids and admission.      Serita Grit, MD 01/16/15 [phone]

## 2015-01-16 NOTE — Progress Notes (Signed)
Called by Dr.Wofford at [facility] PCP at [facility], unassigned 86/M with DM, HTN, Retinal detachment, memory deficits presents to ER after found down by friend Rhabdo, CK 21K, started on IVF Xrays, CT , WNL Troponin 0.09, no chest pain Some hallucinations noted in ER Vital signs WNL Accepted to tele, [facility] admits, inpatient  Domenic Polite,. MD [phone]

## 2015-01-17 DIAGNOSIS — L8990 Pressure ulcer of unspecified site, unspecified stage: Secondary | ICD-10-CM | POA: Insufficient documentation

## 2015-01-17 DIAGNOSIS — F0390 Unspecified dementia without behavioral disturbance: Secondary | ICD-10-CM

## 2015-01-17 DIAGNOSIS — R7989 Other specified abnormal findings of blood chemistry: Secondary | ICD-10-CM

## 2015-01-17 LAB — BASIC METABOLIC PANEL
ANION GAP: 12 (ref 5–15)
BUN: 16 mg/dL (ref 6–20)
CALCIUM: 9 mg/dL (ref 8.9–10.3)
CO2: 25 mmol/L (ref 22–32)
Chloride: 106 mmol/L (ref 101–111)
Creatinine, Ser: 1.02 mg/dL (ref 0.61–1.24)
Glucose, Bld: 112 mg/dL — ABNORMAL HIGH (ref 65–99)
Potassium: 3.9 mmol/L (ref 3.5–5.1)
SODIUM: 143 mmol/L (ref 135–145)

## 2015-01-17 LAB — CK: CK TOTAL: 12708 U/L — AB (ref 49–397)

## 2015-01-17 LAB — TROPONIN I
TROPONIN I: 0.07 ng/mL — AB (ref <0.031)
TROPONIN I: 0.06 ng/mL — AB (ref <0.031)

## 2015-01-17 NOTE — Progress Notes (Signed)
[facility]                                                                              Patient Demographics  Jimmy Solis, is a 81 y.o. male, DOB - [birth year], AYT:[id number]  Admit date - 01/16/2015   Admitting Physician Domenic Polite, MD  Outpatient Primary MD for the patient is Saguier, Iris Pert  LOS - 1   Chief Complaint  Patient presents with  . Fall      HPI on 01/16/2015 by Dr. Jennette Kettle Jimmy Solis is a 81 y.o. male with h/o alzheimer's disease, lives alone at baseline. Patient's POA found him down at home in the bathroom for unknown length of time. Patient dosent remember when he fell which is his baseline memory wise. Most likely happened in the past 24-48 hour time window (probably latter because of the number of ensures that were left in his fridge per his POA.  Assessment & Plan  Acute rhabdomyolysis, nontraumatic -CPK on admission >21K -Repeat CK improved, 12708 -Continue IVF, monitor CPK levels  Acute delirium vs advanced dementia -CT head negative for acute intracranial abnormality  Elevated troponin -Likely secondary to rhabdomyolysis -Continue to cycle -EKG shows no ischemic changes  Leukocytosis -Likely reactive -UA negative, chest exam clear -Continue to monitor CBC  Fall -Per H&P, patient has been falling recently -PT/OT consulted -Patient will likely need SNF  Code Status: Full  Family Communication: None at bedside  Disposition Plan: Admitted. Continue IVF  Time Spent in minutes   30 minutes  Procedures  None  Consults   None  DVT Prophylaxis  Heparin  Lab Results  Component Value Date   PLT 221 01/16/2015    Medications  Scheduled Meds: . heparin  5,000 Units Subcutaneous 3 times per day   Continuous Infusions: . sodium chloride 150 mL/hr at 01/17/15 0258   PRN Meds:.  Antibiotics    Anti-infectives    None      Subjective:   Jimmy Solis seen and examined today.  Patient has dementia.   Does not answer questions correctly.    Objective:   Filed Vitals:   01/16/15 1922 01/16/15 2009 01/16/15 2125 01/17/15 0454  BP: 132/115 185/78 168/80 156/80  Pulse: 74 75 65 75  Temp: 98.8 F (37.1 C)  97.8 F (36.6 C) 97.6 F (36.4 C)  TempSrc:   Oral Oral  Resp: 16 18 18 18   SpO2: 98% 99% 100% 100%    Wt Readings from Last 3 Encounters:  12/02/14 54.432 kg (120 lb)  12/01/14 56.7 kg (125 lb)  10/16/11 61.236 kg (135 lb)    No intake or output data in the 24 hours ending 01/17/15 1044  Exam  General: Well developed, thin, NAD  HEENT: NCAT, mucous membranes dry.   Cardiovascular: S1 S2 auscultated, no rubs, murmurs or gallops. Regular rate and rhythm.  Respiratory: Clear to auscultation   Abdomen: Soft, nontender, nondistended, + bowel sounds  Extremities: warm dry without cyanosis cubbing or edema  Neuro: Awake and alert.  Does not answer questions appropriately.  Can follow commands.  Strength equal and bilateral in upper/lower ext   Skin: brurising  Psych: pleasant  Data  Review   Micro Results No results found for this or any previous visit (from the past 240 hour(s)).  Radiology Reports Dg Shoulder Right  01/16/2015   CLINICAL DATA:  Status post fall in bathroom today with a blow to the right shoulder. Pain. Initial encounter.  EXAM: RIGHT SHOULDER - 2+ VIEW  COMPARISON:  None.  FINDINGS: There is no evidence of fracture or dislocation. There is no evidence of arthropathy or other focal bone abnormality. Soft tissues are unremarkable.  IMPRESSION: Negative exam.   Electronically Signed   By: Inge Rise M.D.   On: 01/16/2015 11:38   Ct Head Wo Contrast  01/16/2015   CLINICAL DATA:  Found down, unresponsive  EXAM: CT HEAD WITHOUT CONTRAST  CT CERVICAL SPINE WITHOUT CONTRAST  TECHNIQUE: Multidetector CT imaging of the head and cervical spine was performed following the standard protocol without intravenous contrast. Multiplanar CT image reconstructions of  the cervical spine were also generated.  COMPARISON:  Head CT 10/16/2011, brain MRI images only 12/17/2014  FINDINGS: CT HEAD FINDINGS  Evidence of left scleral banding. Mild cortical volume loss noted with proportional ventricular prominence. Areas of periventricular white matter hypodensity are most compatible with small vessel ischemic change. No acute hemorrhage, infarct, or mass lesion is identified. No midline shift. Orbits and paranasal sinuses otherwise intact. No skull fracture.  CT CERVICAL SPINE FINDINGS  C1 through the cervicothoracic junction is visualized in its entirety. Moderate multilevel disc degenerative change noted with bilateral neural foraminal narrowing by uncovertebral and hypertrophy, most prominent at C5-C6 and C3-C4. No fracture or dislocation. Multilevel facet osteoarthritic change. Vascular calcifications. Lung apices are grossly clear.  IMPRESSION: No acute intracranial abnormality.  Chronic changes as above.  No cervical spine fracture or dislocation.   Electronically Signed   By: Conchita Paris M.D.   On: 01/16/2015 12:46   Ct Cervical Spine Wo Contrast  01/16/2015   CLINICAL DATA:  Found down, unresponsive  EXAM: CT HEAD WITHOUT CONTRAST  CT CERVICAL SPINE WITHOUT CONTRAST  TECHNIQUE: Multidetector CT imaging of the head and cervical spine was performed following the standard protocol without intravenous contrast. Multiplanar CT image reconstructions of the cervical spine were also generated.  COMPARISON:  Head CT 10/16/2011, brain MRI images only 12/17/2014  FINDINGS: CT HEAD FINDINGS  Evidence of left scleral banding. Mild cortical volume loss noted with proportional ventricular prominence. Areas of periventricular white matter hypodensity are most compatible with small vessel ischemic change. No acute hemorrhage, infarct, or mass lesion is identified. No midline shift. Orbits and paranasal sinuses otherwise intact. No skull fracture.  CT CERVICAL SPINE FINDINGS  C1 through the  cervicothoracic junction is visualized in its entirety. Moderate multilevel disc degenerative change noted with bilateral neural foraminal narrowing by uncovertebral and hypertrophy, most prominent at C5-C6 and C3-C4. No fracture or dislocation. Multilevel facet osteoarthritic change. Vascular calcifications. Lung apices are grossly clear.  IMPRESSION: No acute intracranial abnormality.  Chronic changes as above.  No cervical spine fracture or dislocation.   Electronically Signed   By: Conchita Paris M.D.   On: 01/16/2015 12:46    CBC  Recent Labs Lab 01/16/15 1250  WBC 12.1*  HGB 15.2  HCT 44.3  PLT 221  MCV 98.7  MCH 33.9  MCHC 34.3  RDW 14.0  LYMPHSABS 1.1  MONOABS 1.2*  EOSABS 0.0  BASOSABS 0.0    Chemistries   Recent Labs Lab 01/16/15 1250 01/17/15 0502  NA 141 143  K 3.8 3.9  CL 101 106  CO2 29 25  GLUCOSE 118* 112*  BUN 25* 16  CREATININE 1.06 1.02  CALCIUM 9.7 9.0   ------------------------------------------------------------------------------------------------------------------ CrCl cannot be calculated (Unknown ideal weight.). ------------------------------------------------------------------------------------------------------------------ No results for input(s): HGBA1C in the last 72 hours. ------------------------------------------------------------------------------------------------------------------ No results for input(s): CHOL, HDL, LDLCALC, TRIG, CHOLHDL, LDLDIRECT in the last 72 hours. ------------------------------------------------------------------------------------------------------------------ No results for input(s): TSH, T4TOTAL, T3FREE, THYROIDAB in the last 72 hours.  Invalid input(s): FREET3 ------------------------------------------------------------------------------------------------------------------ No results for input(s): VITAMINB12, FOLATE, FERRITIN, TIBC, IRON, RETICCTPCT in the last 72 hours.  Coagulation profile No results  for input(s): INR, PROTIME in the last 168 hours.  No results for input(s): DDIMER in the last 72 hours.  Cardiac Enzymes  Recent Labs Lab 01/16/15 1250  TROPONINI 0.09*   ------------------------------------------------------------------------------------------------------------------ Invalid input(s): POCBNP    Enrica Corliss D.O. on 01/17/2015 at 10:44 AM  Between 7am to 7pm - Pager - [phone]  After 7pm go to [email or web address] - password [email or web address]  And look for the night coverage person covering for me after hours  [facility] Office  [phone]

## 2015-01-17 NOTE — Progress Notes (Signed)
Patients Friend Rico Ala # [phone] states he is the POA and will bring POA papers in tomorrow morning,  Password provided: [email or web address]. Elta Guadeloupe also notified PT has recommend SNF and a social worker will be reaching out to him Monday with potential bed offers; plan is for patient to be discharged to SNF when medically cleared. Darreld Mclean [facility]

## 2015-01-17 NOTE — Evaluation (Signed)
Physical Therapy Evaluation Patient Details Name: Jimmy Solis MRN: [id number] DOB: Feb 21, 1929 Today's Date: 01/17/2015   History of Present Illness  Jimmy Solis is a 81 y.o. male with h/o alzheimer's disease, lives alone at baseline. Patient's POA found him down at home in the bathroom for unknown length of time. Found to have acute rhabdomyolysis  Clinical Impression  Pt admitted with above diagnosis. Pt currently with functional limitations due to the deficits listed below (see PT Problem List). Pleasantly confused, eager to get out of bed and ambulate with PT. Disoriented to place, time, and situation. Requires min-mod assist for bed mobility, transfers, and gait. High fall risk. Urinary incontinence upon standing with no awareness. Pt will benefit from skilled PT to increase their independence and safety with mobility to allow discharge to the venue listed below.       Follow Up Recommendations SNF;Supervision/Assistance - 24 hour    Equipment Recommendations  None recommended by PT    Recommendations for Other Services       Precautions / Restrictions Precautions Precautions: Fall Restrictions Weight Bearing Restrictions: No      Mobility  Bed Mobility Overal bed mobility: Needs Assistance Bed Mobility: Supine to Sit     Supine to sit: Mod assist     General bed mobility comments: Mod assist for truncal control. Pt anxious and guarded initially. Use of rail.  Transfers Overall transfer level: Needs assistance Equipment used: Rolling walker (2 wheeled) Transfers: Sit to/from Stand Sit to Stand: Min assist         General transfer comment: Min assist for boost and balance due to posterior lean. performed from lowest bed setting and BSC. Cues for hand placement and to shift weight anteriorly  Ambulation/Gait Ambulation/Gait assistance: Min assist Ambulation Distance (Feet): 20 Feet (x2) Assistive device: Rolling walker (2 wheeled) Gait  Pattern/deviations: Step-through pattern;Decreased stride length;Trunk flexed Gait velocity: slow Gait velocity interpretation: <1.8 ft/sec, indicative of risk for recurrent falls General Gait Details: Educated on safe DME use with a rolling walker for support. Min assist frequently for walker control and loss of balance to posterior. Max cues for instructions with this device and upright posture at times.  Stairs            Wheelchair Mobility    Modified Rankin (Stroke Patients Only)       Balance Overall balance assessment: Needs assistance;History of Falls Sitting-balance support: Single extremity supported Sitting balance-Leahy Scale: Poor     Standing balance support: Single extremity supported Standing balance-Leahy Scale: Poor                               Pertinent Vitals/Pain Pain Assessment: No/denies pain    Home Living Family/patient expects to be discharged to:: Unsure Living Arrangements: Alone Available Help at Discharge: Friend(s)           Home Equipment: Gilford Rile - 2 wheels;Cane - single point Additional Comments: Pt with poor memory, unable to obtain comprehensive history.    Prior Function Level of Independence:  (Pt cannot recall)         Comments: Pt poor historian. unable to recall PLOF     Hand Dominance        Extremity/Trunk Assessment   Upper Extremity Assessment: Defer to OT evaluation           Lower Extremity Assessment: Generalized weakness;Difficult to assess due to impaired cognition  Communication   Communication: No difficulties  Cognition Arousal/Alertness: Awake/alert Behavior During Therapy: Impulsive;Anxious Overall Cognitive Status: No family/caregiver present to determine baseline cognitive functioning       Memory: Decreased short-term memory              General Comments General comments (skin integrity, edema, etc.): Uriniary incontinence upon standing, pt unaware.  Poor historian. States his friend Jimmy Solis takes care of him.    Exercises        Assessment/Plan    PT Assessment Patient needs continued PT services  PT Diagnosis Difficulty walking;Abnormality of gait;Generalized weakness;Altered mental status   PT Problem List Decreased strength;Decreased activity tolerance;Decreased balance;Decreased mobility;Decreased range of motion;Decreased cognition;Decreased knowledge of use of DME;Decreased safety awareness  PT Treatment Interventions DME instruction;Gait training;Functional mobility training;Therapeutic activities;Therapeutic exercise;Balance training;Cognitive remediation;Patient/family education   PT Goals (Current goals can be found in the Care Plan section) Acute Rehab PT Goals Patient Stated Goal: none stated PT Goal Formulation: With patient Time For Goal Achievement: 01/31/15 Potential to Achieve Goals: Good    Frequency Min 3X/week   Barriers to discharge Decreased caregiver support lives alone    Co-evaluation               End of Session Equipment Utilized During Treatment: Gait belt Activity Tolerance: Patient tolerated treatment well Patient left: in chair;with call bell/phone within reach;with chair alarm set;with nursing/sitter in room Nurse Communication: Mobility status         Time: 8416-6063 PT Time Calculation (min) (ACUTE ONLY): 30 min   Charges:   PT Evaluation $Initial PT Evaluation Tier I: 1 Procedure PT Treatments $Gait Training: 8-22 mins   PT G CodesEllouise Newer 01/17/2015, 6:21 PM Jimmy Solis, Jimmy Solis

## 2015-01-18 DIAGNOSIS — D72829 Elevated white blood cell count, unspecified: Secondary | ICD-10-CM

## 2015-01-18 DIAGNOSIS — E876 Hypokalemia: Secondary | ICD-10-CM

## 2015-01-18 LAB — CBC
HCT: 39.6 % (ref 39.0–52.0)
HEMOGLOBIN: 13.6 g/dL (ref 13.0–17.0)
MCH: 34.5 pg — AB (ref 26.0–34.0)
MCHC: 34.3 g/dL (ref 30.0–36.0)
MCV: 100.5 fL — ABNORMAL HIGH (ref 78.0–100.0)
Platelets: 195 10*3/uL (ref 150–400)
RBC: 3.94 MIL/uL — AB (ref 4.22–5.81)
RDW: 14.2 % (ref 11.5–15.5)
WBC: 10 10*3/uL (ref 4.0–10.5)

## 2015-01-18 LAB — BASIC METABOLIC PANEL
Anion gap: 12 (ref 5–15)
BUN: 15 mg/dL (ref 6–20)
CHLORIDE: 108 mmol/L (ref 101–111)
CO2: 22 mmol/L (ref 22–32)
Calcium: 9 mg/dL (ref 8.9–10.3)
Creatinine, Ser: 0.92 mg/dL (ref 0.61–1.24)
GFR calc Af Amer: >60 mL/min (ref >60)
GFR calc non Af Amer: >60 mL/min (ref >60)
Glucose, Bld: 74 mg/dL (ref 65–99)
POTASSIUM: 3.2 mmol/L — AB (ref 3.5–5.1)
SODIUM: 142 mmol/L (ref 135–145)

## 2015-01-18 LAB — MRSA PCR SCREENING: MRSA by PCR: NEGATIVE

## 2015-01-18 LAB — TROPONIN I: Troponin I: 0.05 ng/mL — ABNORMAL HIGH (ref <0.031)

## 2015-01-18 LAB — CK: CK TOTAL: 3626 U/L — AB (ref 49–397)

## 2015-01-18 MED ORDER — HALOPERIDOL LACTATE 5 MG/ML IJ SOLN
2.0000 mg | Freq: Four times a day (QID) | INTRAMUSCULAR | Status: DC | PRN
  Administered 2015-01-18 – 2015-01-21 (×4): 2 mg via INTRAVENOUS
  Filled 2015-01-18 (×4): qty 1

## 2015-01-18 MED ORDER — POTASSIUM CHLORIDE 20 MEQ/15ML (10%) PO SOLN
40.0000 meq | Freq: Every day | ORAL | Status: DC
  Administered 2015-01-18: 40 meq via ORAL
  Filled 2015-01-18: qty 30

## 2015-01-18 MED ORDER — HALOPERIDOL LACTATE 5 MG/ML IJ SOLN: 2.5000 mg | Freq: Once | INTRAMUSCULAR | Status: DC

## 2015-01-18 MED ORDER — ACETAMINOPHEN 325 MG PO TABS
650.0000 mg | ORAL_TABLET | Freq: Four times a day (QID) | ORAL | Status: DC | PRN
  Administered 2015-01-18: 650 mg via ORAL
  Filled 2015-01-18: qty 2

## 2015-01-18 MED ORDER — HYDRALAZINE HCL 20 MG/ML IJ SOLN
10.0000 mg | Freq: Four times a day (QID) | INTRAMUSCULAR | Status: DC | PRN
  Administered 2015-01-18 – 2015-01-23 (×2): 10 mg via INTRAVENOUS
  Filled 2015-01-18 (×2): qty 1

## 2015-01-18 NOTE — Progress Notes (Signed)
Jimmy Solis [facility]) made aware of transfer. Information with contact no  given about  new location.

## 2015-01-18 NOTE — Progress Notes (Signed)
[facility]                                                                              Patient Demographics  Jimmy Solis, is a 81 y.o. male, DOB - [birth year], JKD:[id number]  Admit date - 01/16/2015   Admitting Physician Domenic Polite, MD  Outpatient Primary MD for the patient is Saguier, Iris Pert  LOS - 2   Chief Complaint  Patient presents with  . Fall      HPI on 01/16/2015 by Dr. Jennette Kettle KERT SHACKETT is a 81 y.o. male with h/o alzheimer's disease, lives alone at baseline. Patient's POA found him down at home in the bathroom for unknown length of time. Patient dosent remember when he fell which is his baseline memory wise. Most likely happened in the past 24-48 hour time window (probably latter because of the number of ensures that were left in his fridge per his POA.  Assessment & Plan  Acute rhabdomyolysis, nontraumatic -CPK on admission >21K, improved to 12708 -Continue IVF, monitor CPK levels (pending this monring)  Acute delirium vs advanced dementia -CT head negative for acute intracranial abnormality  Elevated troponin -Likely secondary to rhabdomyolysis -Continue to cycle, trending downward, peak 0.09 -EKG shows no ischemic changes  Leukocytosis -Likely reactive, rsolved -UA negative, chest exam clear -Continue to monitor CBC  Fall -Per H&P, patient has been falling recently -PT/OT consulted- rec SNF  Hypokalemia -Will replace and continue to monitor BMP  Code Status: Full  Family Communication: None at bedside  Disposition Plan: Admitted. Continue IVF.  Will need SNF placement.  Time Spent in minutes   30 minutes  Procedures  None  Consults   None  DVT Prophylaxis  Heparin  Lab Results  Component Value Date   PLT 195 01/18/2015    Medications  Scheduled Meds: . heparin  5,000 Units Subcutaneous 3 times per day   Continuous Infusions: . sodium chloride 150 mL/hr at 01/18/15 0334   PRN Meds:.  Antibiotics      Anti-infectives    None      Subjective:   Leo Rod seen and examined today.  Patient has dementia.  Does not answer questions correctly.    Objective:   Filed Vitals:   01/18/15 0528 01/18/15 0529 01/18/15 0621 01/18/15 0628  BP: 170/73 180/78 129/56   Pulse: 73 76 105 80  Temp: 98.5 F (36.9 C)     TempSrc: Oral     Resp: 18     Height:      Weight:      SpO2: 97% 97% 98%     Wt Readings from Last 3 Encounters:  01/17/15 51.4 kg (113 lb 5.1 oz)  12/02/14 54.432 kg (120 lb)  12/01/14 56.7 kg (125 lb)     Intake/Output Summary (Last 24 hours) at 01/18/15 1149 Last data filed at 01/18/15 1112  Gross per 24 hour  Intake 4467.5 ml  Output    450 ml  Net 4017.5 ml    Exam  General: Well developed, thin, NAD  HEENT: NCAT, mucous membranes dry.   Cardiovascular: S1 S2 auscultated, RRR  Respiratory: Clear to auscultation   Abdomen: Soft, nontender, nondistended, +  bowel sounds  Extremities: warm dry without cyanosis cubbing or edema  Neuro: Awake and alert.  Dementia.  Follows commands.  Psych: pleasant  Data Review   Micro Results No results found for this or any previous visit (from the past 240 hour(s)).  Radiology Reports Dg Shoulder Right  01/16/2015   CLINICAL DATA:  Status post fall in bathroom today with a blow to the right shoulder. Pain. Initial encounter.  EXAM: RIGHT SHOULDER - 2+ VIEW  COMPARISON:  None.  FINDINGS: There is no evidence of fracture or dislocation. There is no evidence of arthropathy or other focal bone abnormality. Soft tissues are unremarkable.  IMPRESSION: Negative exam.   Electronically Signed   By: Inge Rise M.D.   On: 01/16/2015 11:38   Ct Head Wo Contrast  01/16/2015   CLINICAL DATA:  Found down, unresponsive  EXAM: CT HEAD WITHOUT CONTRAST  CT CERVICAL SPINE WITHOUT CONTRAST  TECHNIQUE: Multidetector CT imaging of the head and cervical spine was performed following the standard protocol without intravenous  contrast. Multiplanar CT image reconstructions of the cervical spine were also generated.  COMPARISON:  Head CT 10/16/2011, brain MRI images only 12/17/2014  FINDINGS: CT HEAD FINDINGS  Evidence of left scleral banding. Mild cortical volume loss noted with proportional ventricular prominence. Areas of periventricular white matter hypodensity are most compatible with small vessel ischemic change. No acute hemorrhage, infarct, or mass lesion is identified. No midline shift. Orbits and paranasal sinuses otherwise intact. No skull fracture.  CT CERVICAL SPINE FINDINGS  C1 through the cervicothoracic junction is visualized in its entirety. Moderate multilevel disc degenerative change noted with bilateral neural foraminal narrowing by uncovertebral and hypertrophy, most prominent at C5-C6 and C3-C4. No fracture or dislocation. Multilevel facet osteoarthritic change. Vascular calcifications. Lung apices are grossly clear.  IMPRESSION: No acute intracranial abnormality.  Chronic changes as above.  No cervical spine fracture or dislocation.   Electronically Signed   By: Conchita Paris M.D.   On: 01/16/2015 12:46   Ct Cervical Spine Wo Contrast  01/16/2015   CLINICAL DATA:  Found down, unresponsive  EXAM: CT HEAD WITHOUT CONTRAST  CT CERVICAL SPINE WITHOUT CONTRAST  TECHNIQUE: Multidetector CT imaging of the head and cervical spine was performed following the standard protocol without intravenous contrast. Multiplanar CT image reconstructions of the cervical spine were also generated.  COMPARISON:  Head CT 10/16/2011, brain MRI images only 12/17/2014  FINDINGS: CT HEAD FINDINGS  Evidence of left scleral banding. Mild cortical volume loss noted with proportional ventricular prominence. Areas of periventricular white matter hypodensity are most compatible with small vessel ischemic change. No acute hemorrhage, infarct, or mass lesion is identified. No midline shift. Orbits and paranasal sinuses otherwise intact. No skull  fracture.  CT CERVICAL SPINE FINDINGS  C1 through the cervicothoracic junction is visualized in its entirety. Moderate multilevel disc degenerative change noted with bilateral neural foraminal narrowing by uncovertebral and hypertrophy, most prominent at C5-C6 and C3-C4. No fracture or dislocation. Multilevel facet osteoarthritic change. Vascular calcifications. Lung apices are grossly clear.  IMPRESSION: No acute intracranial abnormality.  Chronic changes as above.  No cervical spine fracture or dislocation.   Electronically Signed   By: Conchita Paris M.D.   On: 01/16/2015 12:46    CBC  Recent Labs Lab 01/16/15 1250 01/18/15 0430  WBC 12.1* 10.0  HGB 15.2 13.6  HCT 44.3 39.6  PLT 221 195  MCV 98.7 100.5*  MCH 33.9 34.5*  MCHC 34.3 34.3  RDW 14.0 14.2  LYMPHSABS 1.1  --   MONOABS 1.2*  --   EOSABS 0.0  --   BASOSABS 0.0  --     Chemistries   Recent Labs Lab 01/16/15 1250 01/17/15 0502 01/18/15 0430  NA 141 143 142  K 3.8 3.9 3.2*  CL 101 106 108  CO2 29 25 22   GLUCOSE 118* 112* 74  BUN 25* 16 15  CREATININE 1.06 1.02 0.92  CALCIUM 9.7 9.0 9.0   ------------------------------------------------------------------------------------------------------------------ estimated creatinine clearance is 43.5 mL/min (by C-G formula based on Cr of 0.92). ------------------------------------------------------------------------------------------------------------------ No results for input(s): HGBA1C in the last 72 hours. ------------------------------------------------------------------------------------------------------------------ No results for input(s): CHOL, HDL, LDLCALC, TRIG, CHOLHDL, LDLDIRECT in the last 72 hours. ------------------------------------------------------------------------------------------------------------------ No results for input(s): TSH, T4TOTAL, T3FREE, THYROIDAB in the last 72 hours.  Invalid input(s):  FREET3 ------------------------------------------------------------------------------------------------------------------ No results for input(s): VITAMINB12, FOLATE, FERRITIN, TIBC, IRON, RETICCTPCT in the last 72 hours.  Coagulation profile No results for input(s): INR, PROTIME in the last 168 hours.  No results for input(s): DDIMER in the last 72 hours.  Cardiac Enzymes  Recent Labs Lab 01/17/15 1150 01/17/15 1842 01/17/15 2223  TROPONINI 0.07* 0.06* 0.05*   ------------------------------------------------------------------------------------------------------------------ Invalid input(s): POCBNP    Belva Koziel D.O. on 01/18/2015 at 11:49 AM  Between 7am to 7pm - Pager - [phone]  After 7pm go to [email or web address] - password [email or web address]  And look for the night coverage person covering for me after hours  [facility] Office  [phone]

## 2015-01-18 NOTE — Progress Notes (Signed)
Attempted report x1. Nurse to call back for report.

## 2015-01-18 NOTE — Progress Notes (Signed)
Pt transferred to 2C13, report given to Van, Therapist, sports. No s/s of distress noted during transfer.

## 2015-01-18 NOTE — Progress Notes (Signed)
Pt very agitated all day with increased HR, has been very demented and needed extra attention.  Dr. [name] paged and order was given for Haldol 2mg , given at 1410.  Return page to Dr. [name] @ 1800 with concerns of pt shaking, increased HR and in a very demented state. Dr. [name] ordering pt to be transferred to step down where he can be watched closely.  Rosalio Loud, RN

## 2015-01-18 NOTE — Progress Notes (Signed)
Called for a "second set of eyes" for patient with increased HR and tremors.  Patient is lying in bed, trying to grab invisible objects in the air.  Patient is very confused and is seeing things and talking to people who are not there and also has very bad all over body tremors.  Erratic heart rhythm possibly due to shaking.  Patient covered with warm blankets and is now resting with minimal shaking and HR 99 on monitor.  Patient is being transferred to SDU.  No RRT interventions, RN to call if assistance needed

## 2015-01-18 NOTE — Evaluation (Addendum)
Occupational Therapy Evaluation Patient Details Name: Jimmy Solis MRN: [id number] DOB: 1928/08/15 Today's Date: 01/18/2015    History of Present Illness Jimmy Solis is a 81 y.o. male with h/o alzheimer's disease, lives alone at baseline. Patient's POA found him down at home in the bathroom for unknown length of time. Found to have acute rhabdomyolysis   Clinical Impression   Pt admitted with above. Unsure of pt's PLOF. Feel pt will benefit from acute OT to increase independence prior to d/c.     Follow Up Recommendations  SNF;Supervision/Assistance - 24 hour    Equipment Recommendations  Other (comment) (defer to next venue)    Recommendations for Other Services       Precautions / Restrictions Precautions Precautions: Fall Restrictions Weight Bearing Restrictions: No      Mobility Bed Mobility Overal bed mobility: Needs Assistance Bed Mobility: Supine to Sit;Sit to Supine     Supine to sit: Max assist Sit to supine: +2 for physical assistance;Total assist      Transfers Overall transfer level: Needs assistance Equipment used: Rolling walker (2 wheeled) Transfers: Sit to/from Stand Sit to Stand: +2 safety/equipment;Min assist;+2 physical assistance              Balance  Decreased balance sitting EOB as well as standing balance. Used RW for support with standing.                                           ADL Overall ADL's : Needs assistance/impaired                     Lower Body Dressing: +2 for safety/equipment;Max-Total assist;Sit to/from stand;+2 for physical assistance   Toilet Transfer: +2 for safety/equipment;Minimal assistance;RW;+2 for physical assistance (sit to stand from bed)   Toileting- Clothing Manipulation and Hygiene: +2 for safety/equipment;Total assistance;Sit to/from stand;+2 for physical assistance         General ADL Comments: Pt shaky in session. Assisting in donning new briefs.  Pt talking to  someone in room who was not there.     Vision     Perception     Praxis      Pertinent Vitals/Pain Pain Assessment: Faces Faces Pain Scale: Hurts even more Pain Location: Right hip Pain Descriptors / Indicators: Grimacing (reporting pain) Pain Intervention(s): Repositioned;Monitored during session   HR up to 130s in session.      Hand Dominance     Extremity/Trunk Assessment Upper Extremity Assessment Upper Extremity Assessment: Difficult to assess due to impaired cognition   Lower Extremity Assessment Lower Extremity Assessment: Defer to PT evaluation       Communication Communication Communication: No difficulties   Cognition Arousal/Alertness: Awake/alert Behavior During Therapy: Anxious Overall Cognitive Status: No family/caregiver present to determine baseline cognitive functioning (per chart, alzheimer's disease at baseline)                     General Comments       Exercises       Shoulder Instructions      Home Living Family/patient expects to be discharged to:: Unsure Living Arrangements: Alone Available Help at Discharge: Friend(s)                         Home Equipment: Gilford Rile - 2 wheels;Cane - single point    Comments:  Unsure of home setup information; this information taken from PT evaluation.      Prior Functioning/Environment Level of Independence:  (unsure)             OT Diagnosis: Cognitive deficits;Acute pain;Generalized weakness   OT Problem List: Decreased strength;Decreased activity tolerance;Impaired balance (sitting and/or standing);Decreased knowledge of use of DME or AE;Decreased knowledge of precautions;Decreased safety awareness;Decreased cognition;Pain   OT Treatment/Interventions: Self-care/ADL training;Therapeutic activities;Patient/family education;Balance training;Cognitive remediation/compensation;DME and/or AE instruction;Energy conservation    OT Goals(Current goals can be found in the care  plan section) Acute Rehab OT Goals Patient Stated Goal: none stated OT Goal Formulation: Patient unable to participate in goal setting Time For Goal Achievement: 01/25/15 Potential to Achieve Goals: Fair ADL Goals Pt Will Perform Upper Body Bathing: with supervision;with set-up;sitting Pt Will Perform Lower Body Bathing: with min assist;sit to/from stand Pt Will Perform Upper Body Dressing: with set-up;with supervision;sitting Pt Will Perform Lower Body Dressing: with min assist;sit to/from stand Pt Will Transfer to Toilet: with min guard assist;ambulating Pt Will Perform Toileting - Clothing Manipulation and hygiene: with min guard assist;sit to/from stand  OT Frequency: Min 2X/week   Barriers to D/C:            Co-evaluation              End of Session Equipment Utilized During Treatment: Gait belt;Rolling walker Nurse Communication: Other (comment) (assisted with session; HR; she may want to put mitts on pt)  Activity Tolerance: Other (comment) (elevated HR) Patient left: in bed;with call bell/phone within reach;with bed alarm set;with nursing/sitter in room   Time: 4854-6270 OT Time Calculation (min): 16 min Charges:  OT General Charges $OT Visit: 1 Procedure OT Evaluation $Initial OT Evaluation Tier I: 1 Procedure G-CodesBenito Mccreedy OTR/L [id number] 01/18/2015, 5:30 PM

## 2015-01-19 ENCOUNTER — Inpatient Hospital Stay (HOSPITAL_COMMUNITY): Payer: Medicare PPO

## 2015-01-19 DIAGNOSIS — R1310 Dysphagia, unspecified: Secondary | ICD-10-CM | POA: Insufficient documentation

## 2015-01-19 DIAGNOSIS — W19XXXA Unspecified fall, initial encounter: Secondary | ICD-10-CM | POA: Insufficient documentation

## 2015-01-19 LAB — BASIC METABOLIC PANEL
Anion gap: 9 (ref 5–15)
BUN: 16 mg/dL (ref 6–20)
CHLORIDE: 113 mmol/L — AB (ref 101–111)
CO2: 19 mmol/L — AB (ref 22–32)
CREATININE: 0.98 mg/dL (ref 0.61–1.24)
Calcium: 8.2 mg/dL — ABNORMAL LOW (ref 8.9–10.3)
GFR calc non Af Amer: >60 mL/min (ref >60)
Glucose, Bld: 92 mg/dL (ref 65–99)
POTASSIUM: 3.4 mmol/L — AB (ref 3.5–5.1)
Sodium: 141 mmol/L (ref 135–145)

## 2015-01-19 LAB — CBC
HEMATOCRIT: 33.8 % — AB (ref 39.0–52.0)
HEMOGLOBIN: 11.6 g/dL — AB (ref 13.0–17.0)
MCH: 34.3 pg — ABNORMAL HIGH (ref 26.0–34.0)
MCHC: 34.3 g/dL (ref 30.0–36.0)
MCV: 100 fL (ref 78.0–100.0)
Platelets: 171 10*3/uL (ref 150–400)
RBC: 3.38 MIL/uL — AB (ref 4.22–5.81)
RDW: 14.4 % (ref 11.5–15.5)
WBC: 10 10*3/uL (ref 4.0–10.5)

## 2015-01-19 LAB — MAGNESIUM: MAGNESIUM: 1.8 mg/dL (ref 1.7–2.4)

## 2015-01-19 LAB — STREP PNEUMONIAE URINARY ANTIGEN: STREP PNEUMO URINARY ANTIGEN: NEGATIVE

## 2015-01-19 MED ORDER — POTASSIUM CHLORIDE 20 MEQ/15ML (10%) PO SOLN
40.0000 meq | Freq: Two times a day (BID) | ORAL | Status: DC
  Administered 2015-01-19: 40 meq via ORAL

## 2015-01-19 MED ORDER — POTASSIUM CHLORIDE 10 MEQ/100ML IV SOLN
10.0000 meq | INTRAVENOUS | Status: AC
  Administered 2015-01-19 – 2015-01-20 (×3): 10 meq via INTRAVENOUS
  Filled 2015-01-19 (×3): qty 100

## 2015-01-19 MED ORDER — VANCOMYCIN HCL IN DEXTROSE 750-5 MG/150ML-% IV SOLN
750.0000 mg | INTRAVENOUS | Status: DC
  Administered 2015-01-20 – 2015-01-22 (×3): 750 mg via INTRAVENOUS
  Filled 2015-01-19 (×5): qty 150

## 2015-01-19 MED ORDER — DEXTROSE 5 % IV SOLN
2.0000 g | Freq: Three times a day (TID) | INTRAVENOUS | Status: DC
  Administered 2015-01-19 – 2015-01-23 (×12): 2 g via INTRAVENOUS
  Filled 2015-01-19 (×16): qty 2

## 2015-01-19 MED ORDER — VANCOMYCIN HCL 10 G IV SOLR
1250.0000 mg | Freq: Once | INTRAVENOUS | Status: AC
  Administered 2015-01-19: 1250 mg via INTRAVENOUS
  Filled 2015-01-19: qty 1250

## 2015-01-19 MED ORDER — ACETAMINOPHEN 650 MG RE SUPP
650.0000 mg | RECTAL | Status: DC | PRN
  Administered 2015-01-19: 650 mg via RECTAL

## 2015-01-19 NOTE — Clinical Social Work Placement (Signed)
   CLINICAL SOCIAL WORK PLACEMENT  NOTE  Date:  01/19/2015  Patient Details  Name: Jimmy Solis MRN: [id number] Date of Birth: 03/13/1929  Clinical Social Work is seeking post-discharge placement for this patient at the [place] level of care (*CSW will initial, date and re-position this form in  chart as items are completed):  Yes   Patient/family provided with [facility] Work Department's list of facilities offering this level of care within the geographic area requested by the patient (or if unable, by the patient's family).  Yes   Patient/family informed of their freedom to choose among providers that offer the needed level of care, that participate in Medicare, Medicaid or managed care program needed by the patient, have an available bed and are willing to accept the patient.  Yes   Patient/family informed of [facility]'s ownership interest in [facility] and [facility], as well as of the fact that they are under no obligation to receive care at these facilities.  PASRR submitted to EDS on 01/19/15     PASRR number received on 01/19/15     Existing PASRR number confirmed on       FL2 transmitted to all facilities in geographic area requested by pt/family on 01/19/15     FL2 transmitted to all facilities within larger geographic area on       Patient informed that his/her managed care company has contracts with or will negotiate with certain facilities, including the following:            Patient/family informed of bed offers received.  Patient chooses bed at       Physician recommends and patient chooses bed at      Patient to be transferred to   on  .  Patient to be transferred to facility by       Patient family notified on   of transfer.  Name of family member notified:        PHYSICIAN Please sign FL2     Additional Comment:    _______________________________________________ Cranford Mon, LCSW 01/19/2015,  11:42 AM

## 2015-01-19 NOTE — Progress Notes (Signed)
PT Cancellation Note  Patient Details Name: BOOKER BHATNAGAR MRN: [id number] DOB: Sep 14, 1928   Cancelled Treatment:    Reason Eval/Treat Not Completed: Medical issues which prohibited therapy   Patient now on non-re breather mask (100% FiO2) with SaO2 88-92% and HR 122-130. Will follow-up 9/13 as appropriate.   Chayce Rullo 01/19/2015, 3:38 PM  Pager [phone]

## 2015-01-19 NOTE — Care Management Important Message (Signed)
Important Message  Patient Details  Name: Jimmy Solis MRN: [id number] Date of Birth: July 07, 1928   Medicare Important Message Given:  Yes-second notification given    Nathen May 01/19/2015, 12:23 [place] Message  Patient Details  Name: Jimmy Solis MRN: [id number] Date of Birth: 18-Sep-1928   Medicare Important Message Given:  Yes-second notification given    Nathen May 01/19/2015, 12:23 PM

## 2015-01-19 NOTE — Clinical Social Work Note (Signed)
Clinical Social Work Assessment  Patient Details  Name: Jimmy Solis MRN: [id number] Date of Birth: [birth year]  Date of referral:  01/19/15               Reason for consult:  Facility Placement                Permission sought to share information with:  Facility Sport and exercise psychologist, Family Supports Permission granted to share information::     Name::     Engineer, structural::  [facility]  Relationship::  POA/friend  Contact Information:     Housing/Transportation Living arrangements for the past 2 months:  Tax adviser of Information:  Power of [employer] Patient Interpreter Needed:  None Criminal Activity/Legal Involvement Pertinent to Current Situation/Hospitalization:  No - Comment as needed Significant Relationships:  Friend Lives with:  Self Do you feel safe going back to the place where you live?  No Need for family participation in patient care:  Yes (Comment)  Care giving concerns: Pt lives at home alone but POA visits daily. POA reports that pt has neighbors that also check in with pt daily and POA has a girlfriend who is able to offer some assistance during the day as well   Facilities manager / plan:  CSW spoke with pt POA, Mark, about SNF vs home health.  Employment status:  Retired Nurse, adult PT Recommendations:  [place] / Referral to community resources:  [place]  Patient/Family's Response to care:    Pt POA was interested in what kind of home health services are available- feels as if pt would prefer to return home.  CSW explained differences between home health services and SNF services and explained that SNF would be for short term rehab and likely only be a week or so of rehab services.  Pt POA is agreeable to short term rehab to increase pt independence prior to returning home.   Patient/Family's Understanding of and Emotional Response to Diagnosis, Current Treatment,  and Prognosis: POA seems to have good understanding of pt condition/ prognosis- no questions or concerns at this time regarding pt care  Emotional Assessment Appearance:  Appears stated age Attitude/Demeanor/Rapport:  Unable to Assess Affect (typically observed):  Unable to Assess Orientation:  Oriented to Self, Oriented to Place, Fluctuating Orientation (Suspected and/or reported Sundowners) Alcohol / Substance use:  Not Applicable Psych involvement (Current and /or in the community):  No (Comment)  Discharge Needs  Concerns to be addressed:  Home Safety Concerns, Care Coordination Readmission within the last 30 days:  No Current discharge risk:  Physical Impairment, Cognitively Impaired, Lives alone Barriers to Discharge:  Continued Medical Work up, [employer], [place], LCSW 01/19/2015, 11:33 AM

## 2015-01-19 NOTE — Progress Notes (Signed)
Speech Language Pathology Treatment:    Patient Details Name: Jimmy Solis MRN: [id number] DOB: 1928/08/14 Today's Date: 01/19/2015 Time: 8563-1497 SLP Time Calculation (min) (ACUTE ONLY): 15 min  Assessment / Plan / Recommendation Clinical Impression  MBS completed.  Pls see imaging for full ST report.  Safest diet is NPO.  ST to follow for trial therapy depending upon the patient's ability to follow all commands to fully participate in therapy.  MD informed of results and plans to make pt NPO pending re evaluation by ST next date for possible improvement.     HPI Other Pertinent Information: Jimmy Solis is a 81 y.o. male with h/o alzheimer's disease, lives alone at baseline. Patient's POA found him down at home in the bathroom for unknown length of time. Patient dosent remember when he fell which is his baseline memory wise. Most likely happened in the past 24-48 hour time window (probably latter because of the number of ensures that were left in his fridge per his POA.  Diagnosed with rhabdomyelosis and delerium.           Recommendations Medication Administration: Via alternative means              Oral Care Recommendations: Oral care QID    GO     Jimmy Solis 01/19/2015, 1:42 PM  Shelly Flatten, [place], [place] [facility] SLP [phone]

## 2015-01-19 NOTE — Consult Note (Signed)
ANTIBIOTIC CONSULT NOTE - INITIAL  Pharmacy Consult for vancomycin Indication: rule out pneumonia  Allergies  Allergen Reactions  . Penicillins Hives  . Ativan [Lorazepam] Other (See Comments)    Hallucination, did calm patient down but he became confused / delirious after getting this (delerium may have already been going on before ativan was given, and ativan may have actually converted him from agitated delirium to non-agitated delirium)    Patient Measurements: Height: 5\' 4"  (162.6 cm) Weight: 115 lb 1.3 oz (52.2 kg) IBW/kg (Calculated) : 59.2  Vital Signs: Temp: 103.6 F (39.8 C) (09/12 1600) Temp Source: Rectal (09/12 1600) BP: 143/57 mmHg (09/12 1216) Pulse Rate: 124 (09/12 1511) Intake/Output from previous day: 09/11 0701 - 09/12 0700 In: 3720 [P.O.:120; I.V.:3600] Out: 200 [Urine:200] Intake/Output from this shift: Total I/O In: 120 [P.O.:120] Out: 500 [Urine:500]  Labs:  Recent Labs  01/17/15 0502 01/18/15 0430 01/19/15 0253  WBC  --  10.0 10.0  HGB  --  13.6 11.6*  PLT  --  195 171  CREATININE 1.02 0.92 0.98   Estimated Creatinine Clearance: 41.4 mL/min (by C-G formula based on Cr of 0.98). No results for input(s): VANCOTROUGH, VANCOPEAK, VANCORANDOM, GENTTROUGH, GENTPEAK, GENTRANDOM, TOBRATROUGH, TOBRAPEAK, TOBRARND, AMIKACINPEAK, AMIKACINTROU, AMIKACIN in the last 72 hours.   Assessment: 81 yo male admitted after being found down for unknown length of time on 9/9 (most likely 24-48 hrs)  PMH: prostate CA  ID: Abx for PNA/Aspiration? WBC 10, Tmax 103  Aztreonam 9/12>> (9/20) - (per MD) Vancomycin 9/12>>  9/11 MRSA PCR neg 9/12 Blood x 2 9/12 Sputum  Renal: SCr 0.98 (recovering from rhabdo) Last CK was 3626 on 9/11 (peak 21186 on 9/9). CrCl  ~ 40 mL/min  Goal of Therapy:  Vancomycin trough level 15-20 mcg/ml  Plan:  Vancomycin 1250 mg x 1, then 750 mg q24h Aztreonam per MD Monitor clinical status, renal function, culture results, VT  prn  Levester Fresh, PharmD, [facility] Clinical Pharmacist Pager [phone] 01/19/2015 4:28 PM

## 2015-01-19 NOTE — Progress Notes (Signed)
[facility]                                                                              Patient Demographics  Jimmy Solis, is a 81 y.o. male, DOB - 1928/06/21, CNO:[id number]  Admit date - 01/16/2015   Admitting Physician Domenic Polite, MD  Outpatient Primary MD for the patient is Saguier, Iris Pert  LOS - 3   Chief Complaint  Patient presents with  . Fall      HPI on 01/16/2015 by Dr. Jennette Kettle Jimmy Solis is a 81 y.o. male with h/o alzheimer's disease, lives alone at baseline. Patient's POA found him down at home in the bathroom for unknown length of time. Patient dosent remember when he fell which is his baseline memory wise. Most likely happened in the past 24-48 hour time window (probably latter because of the number of ensures that were left in his fridge per his POA.  Assessment & Plan  Acute rhabdomyolysis, nontraumatic -CPK on admission >21K, improved to 3626 -Continue IVF  Acute delirium vs advanced dementia -CT head negative for acute intracranial abnormality  ?Dypshagia -Speech therapy consulted, recommended NPO -Spoke with POA, patient has had problems in the past and has been seen by a specialist and no intervention done  Elevated troponin -Likely secondary to rhabdomyolysis -Continue to cycle, trending downward, peak 0.09 -EKG shows no ischemic changes  Leukocytosis -Likely reactive, rsolved -UA negative, chest exam clear -Continue to monitor CBC  Fall -Per H&P, patient has been falling recently -PT/OT consulted- rec SNF  Hypokalemia -Will replace and continue to monitor BMP  Goals of care -Spoke with POA regarding Code status and possible need for feeding tube -Patient's POA stated no life saving measures, will change code status to DNR  Code Status: Full  Family Communication: None at bedside, Spoke with POA via phone  Disposition Plan: Admitted. Continue IVF.  Continue to monitor swallowing.  Likely discharge to SNF in  the next 24-48hours  Time Spent in minutes   30 minutes  Procedures  None  Consults   None  DVT Prophylaxis  Heparin  Lab Results  Component Value Date   PLT 171 01/19/2015    Medications  Scheduled Meds: . heparin  5,000 Units Subcutaneous 3 times per day  . potassium chloride  40 mEq Oral BID   Continuous Infusions: . sodium chloride 150 mL/hr at 01/19/15 0603   PRN Meds:.  Antibiotics    Anti-infectives    None      Subjective:   Jimmy Solis seen and examined today.  Patient has dementia.  Does not answer questions correctly.    Objective:   Filed Vitals:   01/19/15 0200 01/19/15 0400 01/19/15 0600 01/19/15 0805  BP: 114/54 118/87 129/84 152/63  Pulse: 70 66 72 82  Temp:  98 F (36.7 C)  98.8 F (37.1 C)  TempSrc:  Oral  Oral  Resp: 25 23 17 24   Height:      Weight:      SpO2: 93% 95% 99% 96%    Wt Readings from Last 3 Encounters:  01/18/15 52.2 kg (115 lb 1.3 oz)  12/02/14 54.432 kg (120 lb)  12/01/14  56.7 kg (125 lb)     Intake/Output Summary (Last 24 hours) at 01/19/15 1134 Last data filed at 01/19/15 0900  Gross per 24 hour  Intake   3720 ml  Output      0 ml  Net   3720 ml    Exam  General: Well developed, thin, NAD  HEENT: NCAT, mucous membranes dry.   Cardiovascular: S1 S2 auscultated, RRR  Respiratory: Clear to auscultation   Abdomen: Soft, nontender, nondistended, + bowel sounds  Extremities: warm dry without cyanosis cubbing or edema  Neuro: Awake and alert.  Dementia.  Follows commands.  Psych: pleasant  Data Review   Micro Results Recent Results (from the past 240 hour(s))  MRSA PCR Screening     Status: None   Collection Time: 01/18/15  9:00 PM  Result Value Ref Range Status   MRSA by PCR NEGATIVE NEGATIVE Final    Comment:        The GeneXpert MRSA Assay (FDA approved for NASAL specimens only), is one component of a comprehensive MRSA colonization surveillance program. It is not intended to  diagnose MRSA infection nor to guide or monitor treatment for MRSA infections.     Radiology Reports Dg Shoulder Right  01/16/2015   CLINICAL DATA:  Status post fall in bathroom today with a blow to the right shoulder. Pain. Initial encounter.  EXAM: RIGHT SHOULDER - 2+ VIEW  COMPARISON:  None.  FINDINGS: There is no evidence of fracture or dislocation. There is no evidence of arthropathy or other focal bone abnormality. Soft tissues are unremarkable.  IMPRESSION: Negative exam.   Electronically Signed   By: Inge Rise M.D.   On: 01/16/2015 11:38   Ct Head Wo Contrast  01/16/2015   CLINICAL DATA:  Found down, unresponsive  EXAM: CT HEAD WITHOUT CONTRAST  CT CERVICAL SPINE WITHOUT CONTRAST  TECHNIQUE: Multidetector CT imaging of the head and cervical spine was performed following the standard protocol without intravenous contrast. Multiplanar CT image reconstructions of the cervical spine were also generated.  COMPARISON:  Head CT 10/16/2011, brain MRI images only 12/17/2014  FINDINGS: CT HEAD FINDINGS  Evidence of left scleral banding. Mild cortical volume loss noted with proportional ventricular prominence. Areas of periventricular white matter hypodensity are most compatible with small vessel ischemic change. No acute hemorrhage, infarct, or mass lesion is identified. No midline shift. Orbits and paranasal sinuses otherwise intact. No skull fracture.  CT CERVICAL SPINE FINDINGS  C1 through the cervicothoracic junction is visualized in its entirety. Moderate multilevel disc degenerative change noted with bilateral neural foraminal narrowing by uncovertebral and hypertrophy, most prominent at C5-C6 and C3-C4. No fracture or dislocation. Multilevel facet osteoarthritic change. Vascular calcifications. Lung apices are grossly clear.  IMPRESSION: No acute intracranial abnormality.  Chronic changes as above.  No cervical spine fracture or dislocation.   Electronically Signed   By: Conchita Paris M.D.    On: 01/16/2015 12:46   Ct Cervical Spine Wo Contrast  01/16/2015   CLINICAL DATA:  Found down, unresponsive  EXAM: CT HEAD WITHOUT CONTRAST  CT CERVICAL SPINE WITHOUT CONTRAST  TECHNIQUE: Multidetector CT imaging of the head and cervical spine was performed following the standard protocol without intravenous contrast. Multiplanar CT image reconstructions of the cervical spine were also generated.  COMPARISON:  Head CT 10/16/2011, brain MRI images only 12/17/2014  FINDINGS: CT HEAD FINDINGS  Evidence of left scleral banding. Mild cortical volume loss noted with proportional ventricular prominence. Areas of periventricular white matter hypodensity are  most compatible with small vessel ischemic change. No acute hemorrhage, infarct, or mass lesion is identified. No midline shift. Orbits and paranasal sinuses otherwise intact. No skull fracture.  CT CERVICAL SPINE FINDINGS  C1 through the cervicothoracic junction is visualized in its entirety. Moderate multilevel disc degenerative change noted with bilateral neural foraminal narrowing by uncovertebral and hypertrophy, most prominent at C5-C6 and C3-C4. No fracture or dislocation. Multilevel facet osteoarthritic change. Vascular calcifications. Lung apices are grossly clear.  IMPRESSION: No acute intracranial abnormality.  Chronic changes as above.  No cervical spine fracture or dislocation.   Electronically Signed   By: Conchita Paris M.D.   On: 01/16/2015 12:46    CBC  Recent Labs Lab 01/16/15 1250 01/18/15 0430 01/19/15 0253  WBC 12.1* 10.0 10.0  HGB 15.2 13.6 11.6*  HCT 44.3 39.6 33.8*  PLT 221 195 171  MCV 98.7 100.5* 100.0  MCH 33.9 34.5* 34.3*  MCHC 34.3 34.3 34.3  RDW 14.0 14.2 14.4  LYMPHSABS 1.1  --   --   MONOABS 1.2*  --   --   EOSABS 0.0  --   --   BASOSABS 0.0  --   --     Chemistries   Recent Labs Lab 01/16/15 1250 01/17/15 0502 01/18/15 0430 01/19/15 0253  NA 141 143 142 141  K 3.8 3.9 3.2* 3.4*  CL 101 106 108 113*    CO2 29 25 22  19*  GLUCOSE 118* 112* 74 92  BUN 25* 16 15 16   CREATININE 1.06 1.02 0.92 0.98  CALCIUM 9.7 9.0 9.0 8.2*  MG  --   --   --  1.8   ------------------------------------------------------------------------------------------------------------------ estimated creatinine clearance is 41.4 mL/min (by C-G formula based on Cr of 0.98). ------------------------------------------------------------------------------------------------------------------ No results for input(s): HGBA1C in the last 72 hours. ------------------------------------------------------------------------------------------------------------------ No results for input(s): CHOL, HDL, LDLCALC, TRIG, CHOLHDL, LDLDIRECT in the last 72 hours. ------------------------------------------------------------------------------------------------------------------ No results for input(s): TSH, T4TOTAL, T3FREE, THYROIDAB in the last 72 hours.  Invalid input(s): FREET3 ------------------------------------------------------------------------------------------------------------------ No results for input(s): VITAMINB12, FOLATE, FERRITIN, TIBC, IRON, RETICCTPCT in the last 72 hours.  Coagulation profile No results for input(s): INR, PROTIME in the last 168 hours.  No results for input(s): DDIMER in the last 72 hours.  Cardiac Enzymes  Recent Labs Lab 01/17/15 1150 01/17/15 1842 01/17/15 2223  TROPONINI 0.07* 0.06* 0.05*   ------------------------------------------------------------------------------------------------------------------ Invalid input(s): POCBNP    Liannah Yarbough D.O. on 01/19/2015 at 11:34 AM  Between 7am to 7pm - Pager - [phone]  After 7pm go to [email or web address] - password [email or web address]  And look for the night coverage person covering for me after hours  [facility] Office  [phone]

## 2015-01-19 NOTE — Care Management Note (Signed)
Case Management Note  Patient Details  Name: Jimmy Solis MRN: [id number] Date of Birth: [birth year]  Subjective/Objective:        Adm w rhabdomyolysis            Action/Plan: lives at home alone, chart states dementia   Expected Discharge Date:                  Expected Discharge Plan:     In-House Referral:  Clinical Social Work  Discharge planning Services     Post Acute Care Choice:    Choice offered to:     DME Arranged:    DME Agency:     HH Arranged:    [place] Agency:     Status of Service:     Medicare Important Message Given:    Date Medicare IM Given:    Medicare IM give by:    Date Additional Medicare IM Given:    Additional Medicare Important Message give by:     If discussed at [place] of Stay Meetings, dates discussed:    Additional Comments: ur review done  Lacretia Leigh, RN 01/19/2015, 8:37 AM

## 2015-01-19 NOTE — Progress Notes (Signed)
PT Cancellation Note  Patient Details Name: Jimmy Solis MRN: [id number] DOB: 02-14-1929   Cancelled Treatment:    Reason Eval/Treat Not Completed: Patient at procedure or test/unavailable. Going for swallow study. Also noted pt with SaO2 in 80s and has been switched from 6L nasal cannula to venturi mask. Will attempt to see later today if appropriate. (Spoke with Eliezer Lofts, SW earlier and aware updated PT note is needed for discharge planning/insurance purposes).   Damaris Geers 01/19/2015, 2:23 PM  Pager [phone]

## 2015-01-19 NOTE — Evaluation (Signed)
Clinical/Bedside Swallow Evaluation Patient Details  Name: Jimmy Solis MRN: [id number] Date of Birth: Nov 20, 1928  Today's Date: 01/19/2015 Time: SLP Start Time (ACUTE ONLY): 1045 SLP Stop Time (ACUTE ONLY): 1100 SLP Time Calculation (min) (ACUTE ONLY): 15 min  Past Medical History:  Past Medical History  Diagnosis Date  . Retinal detachment     left  . 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 Alzheimers disease   . Stroke ~ 12/2014    "lost vision in right eye"  . Blind right eye   . Depression     hx  . Prostate cancer   . Cancer of skin of back   . Falls frequently     "probably once/month" (01/16/2015)  . TIA (transient ischemic attack)     "several over the last couple years; brain scans have reflected that; sees /Dr. Jannifer Franklin" (01/16/2015)   Past Surgical History:  Past Surgical History  Procedure Laterality Date  . Prostatectomy  ~ 2001  . Cataract extraction w/ intraocular lens  implant, bilateral Bilateral ~ 2004  . Retinal detachment surgery Left ~ 2006  . Tonsillectomy    . Eye surgery    . Hernia repair    . Abdominal hernia repair  ~ 2003  . Skin cancer excision  ~ 2013    "back"   HPI:  Jimmy Solis is a 81 y.o. male with h/o alzheimer's disease, lives alone at baseline. Patient's POA found him down at home in the bathroom for unknown length of time. Patient dosent remember when he fell which is his baseline memory wise. Most likely happened in the past 24-48 hour time window (probably latter because of the number of ensures that were left in his fridge per his POA.  Diagnosed with rhabdomyelosis and delerium.     Assessment / Plan / Recommendation Clinical Impression  Clinical swallowing evaluation was completed.  The patient presented with oral and pharyngeal dysphagia characterized by decreased labial seal, delayed oral transit, delayed swallow trigger (tongue pumping was noted) and decreased hyo-laryngeal excursion.  Immediate cough was noted  across all textures.  Of note, per RT, the patient was requiring higher O2 needs.  He had been on a 6 liter nasal canula and is currently on 55% via face mask to keep O2 levels above 90%.  Recommend keep the patient NPO pending results of MBS to determine safest diet.      Aspiration Risk  Moderate    Diet Recommendation NPO   Medication Administration: Via alternative means    Other  Recommendations Oral Care Recommendations: Oral care QID        Swallow Study Prior Functional Status       General Date of Onset: 01/16/15 Other Pertinent Information: Jimmy Solis is a 81 y.o. male with h/o alzheimer's disease, lives alone at baseline. Patient's POA found him down at home in the bathroom for unknown length of time. Patient dosent remember when he fell which is his baseline memory wise. Most likely happened in the past 24-48 hour time window (probably latter because of the number of ensures that were left in his fridge per his POA.  Diagnosed with rhabdomyelosis and delerium.   Type of Study: Bedside swallow evaluation Previous Swallow Assessment: None noted.   Diet Prior to this Study: Regular;Thin liquids Temperature Spikes Noted: No Respiratory Status: Supplemental O2 delivered via (comment) (face mask at 55%) History of Recent Intubation: No Behavior/Cognition: Alert;Cooperative;Distractible;Requires cueing;Confused Self-Feeding Abilities: Needs assist Patient Positioning: Upright in  bed Baseline Vocal Quality: Normal Volitional Cough: Strong Volitional Swallow: Able to elicit    Oral/Motor/Sensory Function Overall Oral Motor/Sensory Function: Impaired Labial ROM: Reduced right Labial Symmetry: Abnormal symmetry right Labial Strength: Within Functional Limits Lingual ROM: Reduced right Lingual Symmetry: Abnormal symmetry right Lingual Strength: Reduced Facial ROM: Within Functional Limits Facial Symmetry: Within Functional Limits Facial Strength: Within Functional  Limits Mandible: Within Functional Limits   Ice Chips Ice chips: Not tested   Thin Liquid Thin Liquid: Impaired Presentation: Spoon Oral Phase Impairments: Reduced labial seal Pharyngeal  Phase Impairments: Suspected delayed Swallow;Decreased hyoid-laryngeal movement;Cough - Immediate    Nectar Thick Nectar Thick Liquid: Not tested   Honey Thick Honey Thick Liquid: Not tested   Puree Puree: Impaired Presentation: Spoon Oral Phase Impairments: Reduced labial seal Oral Phase Functional Implications: Prolonged oral transit Pharyngeal Phase Impairments: Suspected delayed Swallow;Decreased hyoid-laryngeal movement;Cough - Immediate   Solid   GO            Jimmy Solis 01/19/2015,11:09 AM  Shelly Flatten, MA, [place] [facility] SLP [phone]

## 2015-01-19 NOTE — Progress Notes (Signed)
MD made aware of pts temp of 103.6 rectally along with conversion into Afib, rate 103.

## 2015-01-20 DIAGNOSIS — A419 Sepsis, unspecified organism: Secondary | ICD-10-CM

## 2015-01-20 LAB — BASIC METABOLIC PANEL
Anion gap: 8 (ref 5–15)
BUN: 16 mg/dL (ref 6–20)
CHLORIDE: 110 mmol/L (ref 101–111)
CO2: 21 mmol/L — AB (ref 22–32)
CREATININE: 0.93 mg/dL (ref 0.61–1.24)
Calcium: 8 mg/dL — ABNORMAL LOW (ref 8.9–10.3)
GFR calc Af Amer: >60 mL/min (ref >60)
GFR calc non Af Amer: >60 mL/min (ref >60)
GLUCOSE: 102 mg/dL — AB (ref 65–99)
Potassium: 3.4 mmol/L — ABNORMAL LOW (ref 3.5–5.1)
Sodium: 139 mmol/L (ref 135–145)

## 2015-01-20 LAB — CBC
HEMATOCRIT: 34.5 % — AB (ref 39.0–52.0)
HEMOGLOBIN: 11.9 g/dL — AB (ref 13.0–17.0)
MCH: 34.8 pg — AB (ref 26.0–34.0)
MCHC: 34.5 g/dL (ref 30.0–36.0)
MCV: 100.9 fL — AB (ref 78.0–100.0)
Platelets: 148 10*3/uL — ABNORMAL LOW (ref 150–400)
RBC: 3.42 MIL/uL — ABNORMAL LOW (ref 4.22–5.81)
RDW: 14.6 % (ref 11.5–15.5)
WBC: 8.7 10*3/uL (ref 4.0–10.5)

## 2015-01-20 LAB — LEGIONELLA ANTIGEN, URINE

## 2015-01-20 LAB — CK: Total CK: 949 U/L — ABNORMAL HIGH (ref 49–397)

## 2015-01-20 LAB — GLUCOSE, CAPILLARY: GLUCOSE-CAPILLARY: 86 mg/dL (ref 65–99)

## 2015-01-20 LAB — HIV ANTIBODY (ROUTINE TESTING W REFLEX): HIV SCREEN 4TH GENERATION: NONREACTIVE

## 2015-01-20 MED ORDER — POTASSIUM CHLORIDE 10 MEQ/100ML IV SOLN
10.0000 meq | INTRAVENOUS | Status: AC
Start: 2015-01-20 — End: 2015-01-20
  Administered 2015-01-20 (×2): 10 meq via INTRAVENOUS
  Filled 2015-01-20 (×2): qty 100

## 2015-01-20 MED ORDER — MAGNESIUM SULFATE 2 GM/50ML IV SOLN
2.0000 g | Freq: Once | INTRAVENOUS | Status: AC
  Administered 2015-01-20: 2 g via INTRAVENOUS
  Filled 2015-01-20: qty 50

## 2015-01-20 MED ORDER — POTASSIUM CHLORIDE 10 MEQ/100ML IV SOLN
10.0000 meq | INTRAVENOUS | Status: AC
Start: 2015-01-20 — End: 2015-01-20
  Administered 2015-01-20 (×4): 10 meq via INTRAVENOUS
  Filled 2015-01-20 (×4): qty 100

## 2015-01-20 NOTE — Progress Notes (Addendum)
[facility]                                                                              Patient Demographics  Jimmy Solis, is a 81 y.o. male, DOB - 05/01/1929, BHA:[id number]  Admit date - 01/16/2015   Admitting Physician Domenic Polite, MD  Outpatient Primary MD for the patient is Saguier, Jimmy Solis  LOS - 4   Chief Complaint  Patient presents with  . Fall      HPI on 01/16/2015 by Dr. Jennette Kettle Jimmy Solis is a 81 y.o. male with h/o alzheimer's disease, lives alone at baseline. Patient's POA found him down at home in the bathroom for unknown length of time. Patient dosent remember when he fell which is his baseline memory wise. Most likely happened in the past 24-48 hour time window (probably latter because of the number of ensures that were left in his fridge per his POA.  Interim history Rhabo improving, however developed aspiration pna.  Has h/o dysphagia. Speech following, feels it is too early for peg tube.  Assessment & Plan  Sepsis secondary to HCAP vs aspiration pneumonia -patient became febrile with tachycardia and tachypnea -CXR Right airspace consolidation -Started patient on vancomycin and aztreonam (PCN allergy) -Strep pneumonia urine Ag negative, pending legionella urine Ag -HIV negative -Blood cultures pending  Acute rhabdomyolysis, nontraumatic -CPK on admission >21K, improved to 949 -Continue IVF  Acute delirium vs advanced dementia -CT head negative for acute intracranial abnormality  ?Dypshagia -Speech therapy consulted, recommended NPO -MBS by speech: mild oral phase dysphagia, severe pharyngeal phase dysphagia -Spoke with POA, patient has had problems in the past and has been seen by a specialist and no intervention done -Spoke with speech, feels it is too early to consider peg tube.   -Continue to monitor  Elevated troponin -Likely secondary to rhabdomyolysis -Continue to cycle, trending downward, peak 0.09 -EKG shows no  ischemic changes  Fall -Per H&P, patient has been falling recently -PT/OT consulted- rec SNF  Hypokalemia -Continue to replace and monitor BMP (goal >4) -Magnesium 1.8, will replace (goal >2)  Goals of care -Spoke with POA regarding Code status and possible need for feeding tube -Patient's POA stated no life saving measures, will change code status to DNR  Code Status: Full  Family Communication: None at bedside, Spoke with POA via phone  Disposition Plan: Admitted. Continue IVF.  Continue to monitor swallowing and pneumonia  Time Spent in minutes   30 minutes  Procedures  None  Consults   None  DVT Prophylaxis  Heparin  Lab Results  Component Value Date   PLT 148* 01/20/2015    Medications  Scheduled Meds: . aztreonam  2 g Intravenous 3 times per day  . heparin  5,000 Units Subcutaneous 3 times per day  . magnesium sulfate 1 - 4 g bolus IVPB  2 g Intravenous Once  . potassium chloride  10 mEq Intravenous Q1 Hr x 6  . vancomycin  750 mg Intravenous Q24H   Continuous Infusions: . sodium chloride 125 mL/hr at 01/19/15 2227   PRN Meds:.  Antibiotics    Anti-infectives    Start     Dose/Rate Route  Frequency Ordered Stop   01/20/15 1600  vancomycin (VANCOCIN) IVPB 750 mg/150 ml premix     750 mg 150 mL/hr over 60 Minutes Intravenous Every 24 hours 01/19/15 1627     01/19/15 1700  aztreonam (AZACTAM) 2 g in dextrose 5 % 50 mL IVPB     2 g 100 mL/hr over 30 Minutes Intravenous 3 times per day 01/19/15 1619 01/27/15 1359   01/19/15 1700  vancomycin (VANCOCIN) 1,250 mg in sodium chloride 0.9 % 250 mL IVPB     1,250 mg 166.7 mL/hr over 90 Minutes Intravenous  Once 01/19/15 1627 01/19/15 2020      Subjective:   Leo Rod seen and examined today.  Patient has dementia.  Does not answer questions correctly.  Has no complaints this morning.   Objective:   Filed Vitals:   01/19/15 1900 01/19/15 2000 01/20/15 0000 01/20/15 0400  BP: 110/52 100/51  153/64    Pulse:  74  83  Temp: 99.3 F (37.4 C)  98.9 F (37.2 C) 98.5 F (36.9 C)  TempSrc: Axillary  Oral Oral  Resp:  21  29  Height:      Weight:      SpO2:  96%  95%    Wt Readings from Last 3 Encounters:  01/18/15 52.2 kg (115 lb 1.3 oz)  12/02/14 54.432 kg (120 lb)  12/01/14 56.7 kg (125 lb)     Intake/Output Summary (Last 24 hours) at 01/20/15 0900 Last data filed at 01/19/15 2000  Gross per 24 hour  Intake    750 ml  Output    625 ml  Net    125 ml    Exam  General: Well developed, thin, NAD  HEENT: NCAT, mucous membranes dry.   Cardiovascular: S1 S2 auscultated, RRR, 2/6SEM  Respiratory: Clear to auscultation anteriorly  Abdomen: Soft, nontender, nondistended, + bowel sounds  Extremities: warm dry without cyanosis cubbing or edema  Neuro: Awake and alert.  Dementia.  Follows commands.  Psych: pleasant  Data Review   Micro Results Recent Results (from the past 240 hour(s))  MRSA PCR Screening     Status: None   Collection Time: 01/18/15  9:00 PM  Result Value Ref Range Status   MRSA by PCR NEGATIVE NEGATIVE Final    Comment:        The GeneXpert MRSA Assay (FDA approved for NASAL specimens only), is one component of a comprehensive MRSA colonization surveillance program. It is not intended to diagnose MRSA infection nor to guide or monitor treatment for MRSA infections.     Radiology Reports Dg Shoulder Right  01/16/2015   CLINICAL DATA:  Status post fall in bathroom today with a blow to the right shoulder. Pain. Initial encounter.  EXAM: RIGHT SHOULDER - 2+ VIEW  COMPARISON:  None.  FINDINGS: There is no evidence of fracture or dislocation. There is no evidence of arthropathy or other focal bone abnormality. Soft tissues are unremarkable.  IMPRESSION: Negative exam.   Electronically Signed   By: Inge Rise M.D.   On: 01/16/2015 11:38   Ct Head Wo Contrast  01/16/2015   CLINICAL DATA:  Found down, unresponsive  EXAM: CT HEAD WITHOUT  CONTRAST  CT CERVICAL SPINE WITHOUT CONTRAST  TECHNIQUE: Multidetector CT imaging of the head and cervical spine was performed following the standard protocol without intravenous contrast. Multiplanar CT image reconstructions of the cervical spine were also generated.  COMPARISON:  Head CT 10/16/2011, brain MRI images only 12/17/2014  FINDINGS: CT  HEAD FINDINGS  Evidence of left scleral banding. Mild cortical volume loss noted with proportional ventricular prominence. Areas of periventricular white matter hypodensity are most compatible with small vessel ischemic change. No acute hemorrhage, infarct, or mass lesion is identified. No midline shift. Orbits and paranasal sinuses otherwise intact. No skull fracture.  CT CERVICAL SPINE FINDINGS  C1 through the cervicothoracic junction is visualized in its entirety. Moderate multilevel disc degenerative change noted with bilateral neural foraminal narrowing by uncovertebral and hypertrophy, most prominent at C5-C6 and C3-C4. No fracture or dislocation. Multilevel facet osteoarthritic change. Vascular calcifications. Lung apices are grossly clear.  IMPRESSION: No acute intracranial abnormality.  Chronic changes as above.  No cervical spine fracture or dislocation.   Electronically Signed   By: Conchita Paris M.D.   On: 01/16/2015 12:46   Ct Cervical Spine Wo Contrast  01/16/2015   CLINICAL DATA:  Found down, unresponsive  EXAM: CT HEAD WITHOUT CONTRAST  CT CERVICAL SPINE WITHOUT CONTRAST  TECHNIQUE: Multidetector CT imaging of the head and cervical spine was performed following the standard protocol without intravenous contrast. Multiplanar CT image reconstructions of the cervical spine were also generated.  COMPARISON:  Head CT 10/16/2011, brain MRI images only 12/17/2014  FINDINGS: CT HEAD FINDINGS  Evidence of left scleral banding. Mild cortical volume loss noted with proportional ventricular prominence. Areas of periventricular white matter hypodensity are most  compatible with small vessel ischemic change. No acute hemorrhage, infarct, or mass lesion is identified. No midline shift. Orbits and paranasal sinuses otherwise intact. No skull fracture.  CT CERVICAL SPINE FINDINGS  C1 through the cervicothoracic junction is visualized in its entirety. Moderate multilevel disc degenerative change noted with bilateral neural foraminal narrowing by uncovertebral and hypertrophy, most prominent at C5-C6 and C3-C4. No fracture or dislocation. Multilevel facet osteoarthritic change. Vascular calcifications. Lung apices are grossly clear.  IMPRESSION: No acute intracranial abnormality.  Chronic changes as above.  No cervical spine fracture or dislocation.   Electronically Signed   By: Conchita Paris M.D.   On: 01/16/2015 12:46   Dg Chest Port 1 View  01/19/2015   CLINICAL DATA:  Shortness of breath and cough ; concern for aspiration  EXAM: PORTABLE CHEST - 1 VIEW  COMPARISON:  November 01, 2005  FINDINGS: There is patchy airspace consolidation in the right base. Lungs elsewhere clear. Heart size and pulmonary vascularity are normal. No adenopathy. No bone lesions.  IMPRESSION: Right base airspace consolidation. Major differential considerations include pneumonia and aspiration peer lungs elsewhere clear. Cardiac silhouette within normal limits.   Electronically Signed   By: Lowella Grip III M.D.   On: 01/19/2015 13:59   Dg Swallowing Func-speech Pathology  01/19/2015    Objective Swallowing Evaluation:    Patient Details  Name: TRIPP GOINS MRN: [id number] Date of Birth: 1929/01/27  Today's Date: 01/19/2015 Time: SLP Start Time (ACUTE ONLY): 1310-SLP Stop Time (ACUTE ONLY): 1325 SLP Time Calculation (min) (ACUTE ONLY): 15 min  Past Medical History:  Past Medical History  Diagnosis Date  . Retinal detachment     left  . 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 Alzheimers disease   . Stroke ~ 12/2014    "lost vision in right eye"  . Blind right eye   . Depression     hx   . Prostate cancer   . Cancer of skin of back   . Falls frequently     "probably once/month" (01/16/2015)  . TIA (transient ischemic attack)     "  several over the last couple years; brain scans have reflected that;  sees /Dr. Jannifer Franklin" (01/16/2015)   Past Surgical History:  Past Surgical History  Procedure Laterality Date  . Prostatectomy  ~ 2001  . Cataract extraction w/ intraocular lens  implant, bilateral Bilateral ~  2004  . Retinal detachment surgery Left ~ 2006  . Tonsillectomy    . Eye surgery    . Hernia repair    . Abdominal hernia repair  ~ 2003  . Skin cancer excision  ~ 2013    "back"   HPI:  Other Pertinent Information: OLUWADARASIMI REDMON is a 81 y.o. male with h/o  alzheimer's disease, lives alone at baseline. Patient's POA found him  down at home in the bathroom for unknown length of time. Patient dosent  remember when he fell which is his baseline memory wise. Most likely  happened in the past 24-48 hour time window (probably latter because of  the number of ensures that were left in his fridge per his POA.  Diagnosed  with rhabdomyelosis and delerium.    No Data Recorded  Assessment / Plan / Recommendation CHL IP CLINICAL IMPRESSIONS 01/19/2015  Therapy Diagnosis Mild oral phase dysphagia;Severe pharyngeal phase  dysphagia  Clinical Impression MBSS was completed.  The patient presented with mild  oral and severe pharyngeal dysphagia characterized by delayed oral transit  with poor bolus cohesion, delayed swallow trigger, decreased airway  closure, decreased epiglottic inversion with cricopharyngeal dysfunction  with pyriform residue across all textures.  The patient penetrated during  the swallow across textures.  However, there was penetration/aspiration  across textures after the swallow from the pyriform residue that appeared  to be due to cricopharyngeal dysfunction.  Safest diet is NPO.  ST to  follow for trial swallowing therapy.          Lamar Sprinkles 01/19/2015, 1:37 PM Shelly Flatten, MA,  CCC-SLP Acute Rehab SLP [phone]     CBC  Recent Labs Lab 01/16/15 1250 01/18/15 0430 01/19/15 0253 01/20/15 0306  WBC 12.1* 10.0 10.0 8.7  HGB 15.2 13.6 11.6* 11.9*  HCT 44.3 39.6 33.8* 34.5*  PLT 221 195 171 148*  MCV 98.7 100.5* 100.0 100.9*  MCH 33.9 34.5* 34.3* 34.8*  MCHC 34.3 34.3 34.3 34.5  RDW 14.0 14.2 14.4 14.6  LYMPHSABS 1.1  --   --   --   MONOABS 1.2*  --   --   --   EOSABS 0.0  --   --   --   BASOSABS 0.0  --   --   --     Chemistries   Recent Labs Lab 01/16/15 1250 01/17/15 0502 01/18/15 0430 01/19/15 0253 01/20/15 0306  NA 141 143 142 141 139  K 3.8 3.9 3.2* 3.4* 3.4*  CL 101 106 108 113* 110  CO2 29 25 22  19* 21*  GLUCOSE 118* 112* 74 92 102*  BUN 25* 16 15 16 16   CREATININE 1.06 1.02 0.92 0.98 0.93  CALCIUM 9.7 9.0 9.0 8.2* 8.0*  MG  --   --   --  1.8  --    ------------------------------------------------------------------------------------------------------------------ estimated creatinine clearance is 43.7 mL/min (by C-G formula based on Cr of 0.93). ------------------------------------------------------------------------------------------------------------------ No results for input(s): HGBA1C in the last 72 hours. ------------------------------------------------------------------------------------------------------------------ No results for input(s): CHOL, HDL, LDLCALC, TRIG, CHOLHDL, LDLDIRECT in the last 72 hours. ------------------------------------------------------------------------------------------------------------------ No results for input(s): TSH, T4TOTAL, T3FREE, THYROIDAB in the last 72 hours.  Invalid input(s): FREET3 ------------------------------------------------------------------------------------------------------------------ No results for  input(s): VITAMINB12, FOLATE, FERRITIN, TIBC, IRON, RETICCTPCT in the last 72 hours.  Coagulation profile No results for input(s): INR, PROTIME in the last 168 hours.  No  results for input(s): DDIMER in the last 72 hours.  Cardiac Enzymes  Recent Labs Lab 01/17/15 1150 01/17/15 1842 01/17/15 2223  TROPONINI 0.07* 0.06* 0.05*   ------------------------------------------------------------------------------------------------------------------ Invalid input(s): POCBNP    Keilany Burnette D.O. on 01/20/2015 at 9:00 AM  Between 7am to 7pm - Pager - [phone]  After 7pm go to [email or web address] - password [email or web address]  And look for the night coverage person covering for me after hours  [facility] Office  [phone]

## 2015-01-20 NOTE — Progress Notes (Signed)
NP notified of pt having 10 beat run of Vtach.

## 2015-01-20 NOTE — Progress Notes (Signed)
Physical Therapy Treatment Patient Details Name: Jimmy Solis MRN: [id number] DOB: 04-14-1929 Today's Date: 01/20/2015    History of Present Illness Jimmy Solis is a 81 y.o. male with h/o alzheimer's disease, lives alone at baseline. Patient's POA found him down at home in the bathroom for unknown length of time. Found to have acute rhabdomyolysis    PT Comments    Pt admitted with above diagnosis. Pt currently with functional limitations due to balance and endurance deficits. Pt is able to stand and pivot but needs mod assist.  Will need SNF for therapy to improve mobility.   Pt will benefit from skilled PT to increase their independence and safety with mobility to allow discharge to the venue listed below.    Follow Up Recommendations  SNF;Supervision/Assistance - 24 hour     Equipment Recommendations  None recommended by PT    Recommendations for Other Services       Precautions / Restrictions Precautions Precautions: Fall Restrictions Weight Bearing Restrictions: No    Mobility  Bed Mobility Overal bed mobility: Needs Assistance Bed Mobility: Supine to Sit     Supine to sit: Mod assist     General bed mobility comments: Mod assist for truncal control and to move LEs off bed. Pt anxious and guarded initially. Use of rail.  Transfers Overall transfer level: Needs assistance Equipment used: 2 person hand held assist;1 person hand held assist Transfers: Sit to/from [facility] Sit to Stand: Min assist;+2 physical assistance;Mod assist Stand pivot transfers: Mod assist;+2 physical assistance       General transfer comment: Min assist for boost and balance due to posterior lean. Cues for hand placement and to shift weight anteriorly.  Pt performed stand pivot to 3N1 first and then stand pivot to recliner from there.  Mod assist at times due to posterior lean as well as significantly flexed posture.Pt with such flexed posture needed assist and cues  to guide hips to recliner and sit down with need for help to control descent into chair.   Ambulation/Gait                 Stairs            Wheelchair Mobility    Modified Rankin (Stroke Patients Only)       Balance Overall balance assessment: Needs assistance;History of Falls Sitting-balance support: Feet supported;Bilateral upper extremity supported Sitting balance-Leahy Scale: Fair Sitting balance - Comments: could sit for periods of time without UE support.   Standing balance support: Bilateral upper extremity supported;During functional activity Standing balance-Leahy Scale: Poor Standing balance comment: Pt needed bil UE support to stand at EOB.  very flexed posture with pt unable to stand fully upright even with cues and assist.                     Cognition Arousal/Alertness: Awake/alert Behavior During Therapy: Anxious Overall Cognitive Status: No family/caregiver present to determine baseline cognitive functioning (per chart, alzheimer's disease at baseline)       Memory: Decreased short-term memory              Exercises      General Comments General comments (skin integrity, edema, etc.): Pt unaware of place.  Oriented to hospital.  Pt thought he was at [place].  Pt wet with urine in bed.  Asked to get on 3N1 and did urinate and have BM into 3N1.  NT came and cleaned pt while PT supported pt in  standing.  NT also giving pt bath.        Pertinent Vitals/Pain Pain Assessment: No/denies pain Faces Pain Scale: No hurt  VSS    Home Living                      Prior Function            PT Goals (current goals can now be found in the care plan section) Progress towards PT goals: Progressing toward goals    Frequency  Min 3X/week    PT Plan Current plan remains appropriate    Co-evaluation             End of Session Equipment Utilized During Treatment: Gait belt;Oxygen Activity Tolerance: Patient limited by  fatigue Patient left: in chair;with call bell/phone within reach;with chair alarm set;with restraints reapplied     Time: 5379-4327 PT Time Calculation (min) (ACUTE ONLY): 21 min  Charges:  $Therapeutic Activity: 8-22 mins                    G Codes:      Jimmy Solis, Jimmy Solis 02-05-2015, 10:40 AM Jimmy Solis,PT Acute Rehabilitation [phone] (pager)

## 2015-01-20 NOTE — Progress Notes (Signed)
Speech Language Pathology Treatment: Dysphagia  Patient Details Name: Jimmy Solis MRN: [id number] DOB: Feb 23, 1929 Today's Date: 01/20/2015 Time: 5520-8022 SLP Time Calculation (min) (ACUTE ONLY): 20 min  Assessment / Plan / Recommendation Clinical Impression  During po upgrade trials with puree, pt exhibited s/sx of aspiration (immediate throat clear, immediate cough, and multiple swallows). The pt is likely still penetrating pharyngeal residue as noted in MBS on 01/19/15. Pt's risk of aspiration is high due to his overt s/sx of aspiration and AMS. SLP provided education, reviewed results of MBS, and clinical reasoning. SLP discussed with MD, pt is likely a chronic aspirator, may improve as his current illness improves. Recommend keeping pt NPO, will f/u with po upgrade trials, swallow treatment, education, and compensatory strategies.    HPI Other Pertinent Information: Jimmy Solis is a 81 y.o. male with h/o alzheimer's disease, lives alone at baseline. Patient's POA found him down at home in the bathroom for unknown length of time. Patient dosent remember when he fell which is his baseline memory wise. Most likely happened in the past 24-48 hour time window (probably latter because of the number of ensures that were left in his fridge per his POA.  Diagnosed with rhabdomyelosis and delerium.     Pertinent Vitals Pain Assessment: Faces Faces Pain Scale: No hurt  SLP Plan  Continue with current plan of care    Recommendations Diet recommendations: NPO Medication Administration: Via alternative means              Oral Care Recommendations: Oral care QID Follow up Recommendations: 24 hour supervision/assistance;Skilled Nursing facility Plan: Continue with current plan of care    [place], Student-SLP  Lanier Ensign 01/20/2015, 9:40 AM

## 2015-01-21 DIAGNOSIS — R1310 Dysphagia, unspecified: Secondary | ICD-10-CM

## 2015-01-21 DIAGNOSIS — R410 Disorientation, unspecified: Secondary | ICD-10-CM

## 2015-01-21 DIAGNOSIS — L8990 Pressure ulcer of unspecified site, unspecified stage: Secondary | ICD-10-CM

## 2015-01-21 DIAGNOSIS — M6282 Rhabdomyolysis: Principal | ICD-10-CM

## 2015-01-21 LAB — CBC
HEMATOCRIT: 33.4 % — AB (ref 39.0–52.0)
Hemoglobin: 11.3 g/dL — ABNORMAL LOW (ref 13.0–17.0)
MCH: 33.7 pg (ref 26.0–34.0)
MCHC: 33.8 g/dL (ref 30.0–36.0)
MCV: 99.7 fL (ref 78.0–100.0)
Platelets: 167 10*3/uL (ref 150–400)
RBC: 3.35 MIL/uL — ABNORMAL LOW (ref 4.22–5.81)
RDW: 14.6 % (ref 11.5–15.5)
WBC: 8.7 10*3/uL (ref 4.0–10.5)

## 2015-01-21 LAB — COMPREHENSIVE METABOLIC PANEL
ALK PHOS: 60 U/L (ref 38–126)
ALT: 50 U/L (ref 17–63)
ANION GAP: 7 (ref 5–15)
AST: 44 U/L — ABNORMAL HIGH (ref 15–41)
Albumin: 2.4 g/dL — ABNORMAL LOW (ref 3.5–5.0)
BILIRUBIN TOTAL: 0.9 mg/dL (ref 0.3–1.2)
BUN: 13 mg/dL (ref 6–20)
CALCIUM: 8.3 mg/dL — AB (ref 8.9–10.3)
CO2: 23 mmol/L (ref 22–32)
Chloride: 107 mmol/L (ref 101–111)
Creatinine, Ser: 0.79 mg/dL (ref 0.61–1.24)
GFR calc Af Amer: >60 mL/min (ref >60)
Glucose, Bld: 92 mg/dL (ref 65–99)
POTASSIUM: 3.7 mmol/L (ref 3.5–5.1)
Sodium: 137 mmol/L (ref 135–145)
TOTAL PROTEIN: 5.6 g/dL — AB (ref 6.5–8.1)

## 2015-01-21 LAB — GLUCOSE, CAPILLARY
GLUCOSE-CAPILLARY: 103 mg/dL — AB (ref 65–99)
GLUCOSE-CAPILLARY: 128 mg/dL — AB (ref 65–99)
GLUCOSE-CAPILLARY: 93 mg/dL (ref 65–99)
GLUCOSE-CAPILLARY: 96 mg/dL (ref 65–99)
Glucose-Capillary: 72 mg/dL (ref 65–99)
Glucose-Capillary: 112 mg/dL — ABNORMAL HIGH (ref 65–99)

## 2015-01-21 LAB — MAGNESIUM: MAGNESIUM: 1.9 mg/dL (ref 1.7–2.4)

## 2015-01-21 MED ORDER — DEXTROSE-NACL 5-0.45 % IV SOLN
INTRAVENOUS | Status: DC
  Administered 2015-01-21: 06:00:00 via INTRAVENOUS

## 2015-01-21 MED ORDER — RESOURCE THICKENUP CLEAR PO POWD
ORAL | Status: DC | PRN
  Filled 2015-01-21: qty 125

## 2015-01-21 NOTE — Progress Notes (Signed)
Pt is extremely agitated.  He removed his mittens and resists staff attempts at care.  Haldol 2 mg IV adm as ordered.  Nursing staff will continue to monitor.

## 2015-01-21 NOTE — Progress Notes (Signed)
Jimmy Solis has received Jimmy Solis insurance authJosem Solis will be good through Friday- they are able to accept pt when ready for DC  CSW will continue to follow  Jimmy Solis, [place] Social Worker [phone]

## 2015-01-21 NOTE — Progress Notes (Signed)
Speech Language Pathology Treatment: Dysphagia  Patient Details Name: Jimmy Solis MRN: [id number] DOB: 10-Apr-1929 Today's Date: 01/21/2015 Time: 4235-3614 SLP Time Calculation (min) (ACUTE ONLY): 24 min  Assessment / Plan / Recommendation Clinical Impression  Pt tolerated teaspoon bites of puree and nectar with only one instance of suspected aspiration (delayed cough after puree), improvement from previous session. SLP provided pt education and moderate verbal cues to swallow bolus, no speaking when eating, will need full assistance to implement strategies. Pt demonstrates a high risk of aspiration (AMS, multiple swallows). SLP will talk with MD about cautiously feeding puree and pudding thick liquids with full supervision and max assist when pt is alert and ready for po. NPO until confirmation from MD. SLP will continue to f/u with swallow treatment.   HPI Other Pertinent Information: Jimmy Solis is a 81 y.o. male with h/o alzheimer's disease, lives alone at baseline. Patient's POA found him down at home in the bathroom for unknown length of time. Patient dosent remember when he fell which is his baseline memory wise. Most likely happened in the past 24-48 hour time window (probably latter because of the number of ensures that were left in his fridge per his POA.  Diagnosed with rhabdomyelosis and delerium.     Pertinent Vitals Pain Assessment: Faces Faces Pain Scale: No hurt  SLP Plan  Continue with current plan of care    Recommendations Diet recommendations: Dysphagia 1 (puree) Liquids provided via: Teaspoon Medication Administration: Crushed with puree Supervision: Full supervision/cueing for compensatory strategies;Staff to assist with self feeding Compensations: Small sips/bites;Slow rate;Clear throat intermittently Postural Changes and/or Swallow Maneuvers: Seated upright 90 degrees              Oral Care Recommendations: Oral care QID Follow up Recommendations: 24 hour  supervision/assistance;Skilled Nursing facility Plan: Continue with current plan of care    [place], Student-SLP  Lanier Ensign 01/21/2015, 1:26 PM

## 2015-01-21 NOTE — Progress Notes (Signed)
[facility] PROGRESS NOTE  KNOWLEDGE ESCANDON QBH:[id number] DOB: November 13, 1928 DOA: 01/16/2015 PCP: [name], PA-C  Assessment/Plan: Sepsis secondary to HCAP vs aspiration pneumonia -patient became febrile with tachycardia and tachypnea -CXR with Right airspace consolidation -Continued on vancomycin and aztreonam (PCN allergy) -Strep pneumonia urine Ag negative, neg legionella urine Ag -HIV negative -Blood cultures neg thus far - Pt still requires increased O2 - 4L this AM  Acute rhabdomyolysis, nontraumatic -CPK on admission >21K, improved to 949 with IVF  Acute delirium vs advanced dementia -CT head negative for acute intracranial abnormality  ?Dypshagia -Speech therapy consulted, recommended NPO initially. Pt has since been cleared for puree with pudding thick liquids  -Continue to monitor  Elevated troponin -Likely secondary to rhabdomyolysis -Continue to cycle, trending downward, peak 0.09 -EKG showed no ischemic changes  Fall -Per H&P, patient has been falling recently -PT/OT consulted- rec for SNF  Hypokalemia -Continue to replace and monitor BMP (goal >4) -Magnesium 1.8, will replace (goal >2)  Goals of care -Dr. [name] had spoken with POA regarding Code status and possible need for feeding tube. Patient's POA stated no life saving measures, code status is DNR      Code Status: DNR Family Communication: Pt in room  Disposition Plan: Pending SNF   Consultants:    Procedures:    Antibiotics:  Vancomycin 9/12>>>  Aztreonam 9/12>>>   HPI/Subjective: Pt reports feeling better today  Objective: Filed Vitals:   01/21/15 0800 01/21/15 1000 01/21/15 1200 01/21/15 1400  BP: 143/66 154/128 172/109 170/64  Pulse: 77 89 94 85  Temp: 97.8 F (36.6 C)  97.6 F (36.4 C)   TempSrc: Oral  Oral   Resp: 24 27 18 23   Height:      Weight:      SpO2: 96% 97% 97% 90%    Intake/Output Summary (Last 24 hours) at 01/21/15 1528 Last data filed at  01/21/15 1513  Gross per 24 hour  Intake 2439.08 ml  Output    650 ml  Net 1789.08 ml   Filed Weights   01/17/15 1900 01/18/15 2032  Weight: 51.4 kg (113 lb 5.1 oz) 52.2 kg (115 lb 1.3 oz)    Exam:   General:  Awake, in nad  Cardiovascular: regular, s1, s2  Respiratory: normal resp effort, no wheezing  Abdomen: soft,nondistended  Musculoskeletal: perfused,no clubbing   Data Reviewed: Basic Metabolic Panel:  Recent Labs Lab 01/17/15 0502 01/18/15 0430 01/19/15 0253 01/20/15 0306 01/21/15 0235  NA 143 142 141 139 137  K 3.9 3.2* 3.4* 3.4* 3.7  CL 106 108 113* 110 107  CO2 25 22 19* 21* 23  GLUCOSE 112* 74 92 102* 92  BUN 16 15 16 16 13   CREATININE 1.02 0.92 0.98 0.93 0.79  CALCIUM 9.0 9.0 8.2* 8.0* 8.3*  MG  --   --  1.8  --  1.9   Liver Function Tests:  Recent Labs Lab 01/21/15 0235  AST 44*  ALT 50  ALKPHOS 60  BILITOT 0.9  PROT 5.6*  ALBUMIN 2.4*   No results for input(s): LIPASE, AMYLASE in the last 168 hours. No results for input(s): AMMONIA in the last 168 hours. CBC:  Recent Labs Lab 01/16/15 1250 01/18/15 0430 01/19/15 0253 01/20/15 0306 01/21/15 0235  WBC 12.1* 10.0 10.0 8.7 8.7  NEUTROABS 9.9*  --   --   --   --   HGB 15.2 13.6 11.6* 11.9* 11.3*  HCT 44.3 39.6 33.8* 34.5* 33.4*  MCV 98.7 100.5* 100.0  100.9* 99.7  PLT 221 195 171 148* 167   Cardiac Enzymes:  Recent Labs Lab 01/16/15 1250 01/17/15 0502 01/17/15 1150 01/17/15 1842 01/17/15 2223 01/18/15 0430 01/20/15 0306  CKTOTAL 27517* 12708*  --   --   --  3626* 949*  TROPONINI 0.09*  --  0.07* 0.06* 0.05*  --   --    BNP (last 3 results) No results for input(s): BNP in the last 8760 hours.  ProBNP (last 3 results) No results for input(s): PROBNP in the last 8760 hours.  CBG:  Recent Labs Lab 01/20/15 2106 01/21/15 0004 01/21/15 0343 01/21/15 0829 01/21/15 1219  GLUCAP 86 93 72 96 103*    Recent Results (from the past 240 hour(s))  MRSA PCR Screening      Status: None   Collection Time: 01/18/15  9:00 PM  Result Value Ref Range Status   MRSA by PCR NEGATIVE NEGATIVE Final    Comment:        The GeneXpert MRSA Assay (FDA approved for NASAL specimens only), is one component of a comprehensive MRSA colonization surveillance program. It is not intended to diagnose MRSA infection nor to guide or monitor treatment for MRSA infections.   Culture, blood (routine x 2) Call MD if unable to obtain prior to antibiotics being given     Status: None (Preliminary result)   Collection Time: 01/19/15  4:45 PM  Result Value Ref Range Status   Specimen Description BLOOD LEFT ANTECUBITAL  Final   Special Requests BOTTLES DRAWN AEROBIC AND ANAEROBIC  10CC   Final   Culture NO GROWTH < 24 HOURS  Final   Report Status PENDING  Incomplete  Culture, blood (routine x 2) Call MD if unable to obtain prior to antibiotics being given     Status: None (Preliminary result)   Collection Time: 01/19/15  4:52 PM  Result Value Ref Range Status   Specimen Description BLOOD RIGHT HAND  Final   Special Requests BOTTLES DRAWN AEROBIC AND ANAEROBIC  5CC   Final   Culture NO GROWTH < 24 HOURS  Final   Report Status PENDING  Incomplete     Studies: No results found.  Scheduled Meds: . aztreonam  2 g Intravenous 3 times per day  . heparin  5,000 Units Subcutaneous 3 times per day  . vancomycin  750 mg Intravenous Q24H   Continuous Infusions: . sodium chloride 75 mL/hr at 01/21/15 0609  . dextrose 5 % and 0.45% NaCl 50 mL/hr at 01/21/15 0533    Principal Problem:   Rhabdomyolysis Active Problems:   Delirium   Pressure ulcer   Fall   Dysphagia    Terrin Meddaugh, [facility] Pager [phone]. If 7PM-7AM, please contact night-coverage at [email or web address], password [facility] 01/21/2015, 3:28 PM  LOS: 5 days

## 2015-01-22 LAB — BASIC METABOLIC PANEL
Anion gap: 8 (ref 5–15)
BUN: 18 mg/dL (ref 6–20)
CALCIUM: 8.4 mg/dL — AB (ref 8.9–10.3)
CO2: 27 mmol/L (ref 22–32)
CREATININE: 0.96 mg/dL (ref 0.61–1.24)
Chloride: 107 mmol/L (ref 101–111)
Glucose, Bld: 138 mg/dL — ABNORMAL HIGH (ref 65–99)
Potassium: 2.9 mmol/L — ABNORMAL LOW (ref 3.5–5.1)
SODIUM: 142 mmol/L (ref 135–145)

## 2015-01-22 LAB — COMPREHENSIVE METABOLIC PANEL
ALT: 49 U/L (ref 17–63)
AST: 39 U/L (ref 15–41)
Albumin: 2.6 g/dL — ABNORMAL LOW (ref 3.5–5.0)
Alkaline Phosphatase: 73 U/L (ref 38–126)
Anion gap: 8 (ref 5–15)
BUN: 12 mg/dL (ref 6–20)
CALCIUM: 8.7 mg/dL — AB (ref 8.9–10.3)
CHLORIDE: 107 mmol/L (ref 101–111)
CO2: 27 mmol/L (ref 22–32)
CREATININE: 0.92 mg/dL (ref 0.61–1.24)
Glucose, Bld: 125 mg/dL — ABNORMAL HIGH (ref 65–99)
Potassium: 2.8 mmol/L — ABNORMAL LOW (ref 3.5–5.1)
Sodium: 142 mmol/L (ref 135–145)
Total Bilirubin: 0.8 mg/dL (ref 0.3–1.2)
Total Protein: 6.3 g/dL — ABNORMAL LOW (ref 6.5–8.1)

## 2015-01-22 LAB — CBC
HCT: 34.4 % — ABNORMAL LOW (ref 39.0–52.0)
Hemoglobin: 12 g/dL — ABNORMAL LOW (ref 13.0–17.0)
MCH: 34.4 pg — AB (ref 26.0–34.0)
MCHC: 34.9 g/dL (ref 30.0–36.0)
MCV: 98.6 fL (ref 78.0–100.0)
PLATELETS: 191 10*3/uL (ref 150–400)
RBC: 3.49 MIL/uL — AB (ref 4.22–5.81)
RDW: 14.3 % (ref 11.5–15.5)
WBC: 7.3 10*3/uL (ref 4.0–10.5)

## 2015-01-22 LAB — GLUCOSE, CAPILLARY
GLUCOSE-CAPILLARY: 119 mg/dL — AB (ref 65–99)
GLUCOSE-CAPILLARY: 110 mg/dL — AB (ref 65–99)
GLUCOSE-CAPILLARY: 122 mg/dL — AB (ref 65–99)
GLUCOSE-CAPILLARY: 118 mg/dL — AB (ref 65–99)
Glucose-Capillary: 124 mg/dL — ABNORMAL HIGH (ref 65–99)

## 2015-01-22 MED ORDER — DEXTROSE-NACL 5-0.9 % IV SOLN
INTRAVENOUS | Status: DC
  Administered 2015-01-22: 1000 mL via INTRAVENOUS
  Administered 2015-01-23: 05:00:00 via INTRAVENOUS

## 2015-01-22 MED ORDER — POTASSIUM CHLORIDE 10 MEQ/100ML IV SOLN
10.0000 meq | INTRAVENOUS | Status: AC
  Administered 2015-01-22 – 2015-01-23 (×5): 10 meq via INTRAVENOUS
  Filled 2015-01-22 (×5): qty 100

## 2015-01-22 MED ORDER — POTASSIUM CHLORIDE CRYS ER 20 MEQ PO TBCR
40.0000 meq | EXTENDED_RELEASE_TABLET | Freq: Two times a day (BID) | ORAL | Status: DC
  Administered 2015-01-22: 40 meq via ORAL
  Filled 2015-01-22: qty 2

## 2015-01-22 NOTE — Patient Care Conference (Signed)
Attempted to call pt's POA for update. No answer. Will try again later

## 2015-01-22 NOTE — Progress Notes (Signed)
Patient for transfer; report called to floor  [address] #4.

## 2015-01-22 NOTE — Progress Notes (Signed)
[facility] PROGRESS NOTE  Jimmy Solis ASN:[id number] DOB: Jan 11, 1929 DOA: 01/16/2015 PCP: [name], PA-C  Assessment/Plan: Sepsis secondary to HCAP vs aspiration pneumonia -patient became febrile with tachycardia and tachypnea -CXR with Right airspace consolidation -Continued on vancomycin and aztreonam (PCN allergy) -Strep pneumonia urine Ag negative, neg legionella urine Ag -HIV negative -Blood cultures neg thus far - Pt still requires increased O2 - On room air this AM, 2L [place] currently, still with tachypnea  Acute rhabdomyolysis, nontraumatic -CPK on admission >21K, improved to 949 with IVF  Acute delirium vs advanced dementia -CT head negative for acute intracranial abnormality  ?Dypshagia -Speech therapy consulted, recommended NPO initially. Pt has since been cleared for puree with pudding thick liquids  -Continue to monitor  Elevated troponin -Likely secondary to rhabdomyolysis -Continue to cycle, trending downward, peak 0.09 -EKG showed no ischemic changes  Fall -Per H&P, patient has been falling recently -PT/OT consulted- rec for SNF  Hypokalemia -Continue to replace and monitor BMP (goal >4) -Magnesium 1.8, will replace (goal >2)  Goals of care -Dr. [name] had spoken with POA regarding Code status and possible need for feeding tube. Patient's POA stated no life saving measures, code status is DNR      Code Status: DNR Family Communication: Pt in room  Disposition Plan: Pending SNF   Consultants:    Procedures:    Antibiotics:  Vancomycin 9/12>>>  Aztreonam 9/12>>>   HPI/Subjective: Patient states feeling better today  Objective: Filed Vitals:   01/22/15 0400 01/22/15 0802 01/22/15 1229 01/22/15 1257  BP:  156/71 112/59 115/57  Pulse:  83 80 85  Temp: 97.7 F (36.5 C) 98.3 F (36.8 C) 97.6 F (36.4 C) 97.6 F (36.4 C)  TempSrc: Axillary Axillary Oral Oral  Resp:  35 28 25  Height:      Weight:    52.87 kg (116 lb  8.9 oz)  SpO2:  94% 97% 93%    Intake/Output Summary (Last 24 hours) at 01/22/15 1554 Last data filed at 01/22/15 0900  Gross per 24 hour  Intake    710 ml  Output   1400 ml  Net   -690 ml   Filed Weights   01/17/15 1900 01/18/15 2032 01/22/15 1257  Weight: 51.4 kg (113 lb 5.1 oz) 52.2 kg (115 lb 1.3 oz) 52.87 kg (116 lb 8.9 oz)    Exam:   General:  Awake, in nad, laying in bed  Cardiovascular: regular, s1, s2  Respiratory: normal resp effort, no wheezing  Abdomen: soft,nondistended  Musculoskeletal: perfused,no clubbing, no cyanosis  Data Reviewed: Basic Metabolic Panel:  Recent Labs Lab 01/18/15 0430 01/19/15 0253 01/20/15 0306 01/21/15 0235 01/22/15 0248  NA 142 141 139 137 142  K 3.2* 3.4* 3.4* 3.7 2.8*  CL 108 113* 110 107 107  CO2 22 19* 21* 23 27  GLUCOSE 74 92 102* 92 125*  BUN 15 16 16 13 12   CREATININE 0.92 0.98 0.93 0.79 0.92  CALCIUM 9.0 8.2* 8.0* 8.3* 8.7*  MG  --  1.8  --  1.9  --    Liver Function Tests:  Recent Labs Lab 01/21/15 0235 01/22/15 0248  AST 44* 39  ALT 50 49  ALKPHOS 60 73  BILITOT 0.9 0.8  PROT 5.6* 6.3*  ALBUMIN 2.4* 2.6*   No results for input(s): LIPASE, AMYLASE in the last 168 hours. No results for input(s): AMMONIA in the last 168 hours. CBC:  Recent Labs Lab 01/16/15 1250 01/18/15 0430 01/19/15 0253 01/20/15 1937  01/21/15 0235 01/22/15 0248  WBC 12.1* 10.0 10.0 8.7 8.7 7.3  NEUTROABS 9.9*  --   --   --   --   --   HGB 15.2 13.6 11.6* 11.9* 11.3* 12.0*  HCT 44.3 39.6 33.8* 34.5* 33.4* 34.4*  MCV 98.7 100.5* 100.0 100.9* 99.7 98.6  PLT 221 195 171 148* 167 191   Cardiac Enzymes:  Recent Labs Lab 01/16/15 1250 01/17/15 0502 01/17/15 1150 01/17/15 1842 01/17/15 2223 01/18/15 0430 01/20/15 0306  CKTOTAL 26948* 12708*  --   --   --  3626* 949*  TROPONINI 0.09*  --  0.07* 0.06* 0.05*  --   --    BNP (last 3 results) No results for input(s): BNP in the last 8760 hours.  ProBNP (last 3  results) No results for input(s): PROBNP in the last 8760 hours.  CBG:  Recent Labs Lab 01/21/15 1927 01/22/15 0117 01/22/15 0405 01/22/15 0801 01/22/15 1227  GLUCAP 112* 119* 110* 122* 124*    Recent Results (from the past 240 hour(s))  MRSA PCR Screening     Status: None   Collection Time: 01/18/15  9:00 PM  Result Value Ref Range Status   MRSA by PCR NEGATIVE NEGATIVE Final    Comment:        The GeneXpert MRSA Assay (FDA approved for NASAL specimens only), is one component of a comprehensive MRSA colonization surveillance program. It is not intended to diagnose MRSA infection nor to guide or monitor treatment for MRSA infections.   Culture, blood (routine x 2) Call MD if unable to obtain prior to antibiotics being given     Status: None (Preliminary result)   Collection Time: 01/19/15  4:45 PM  Result Value Ref Range Status   Specimen Description BLOOD LEFT ANTECUBITAL  Final   Special Requests BOTTLES DRAWN AEROBIC AND ANAEROBIC 10CC   Final   Culture NO GROWTH 3 DAYS  Final   Report Status PENDING  Incomplete  Culture, blood (routine x 2) Call MD if unable to obtain prior to antibiotics being given     Status: None (Preliminary result)   Collection Time: 01/19/15  4:52 PM  Result Value Ref Range Status   Specimen Description BLOOD RIGHT HAND  Final   Special Requests BOTTLES DRAWN AEROBIC AND ANAEROBIC  5CC   Final   Culture NO GROWTH 3 DAYS  Final   Report Status PENDING  Incomplete     Studies: No results found.  Scheduled Meds: . aztreonam  2 g Intravenous 3 times per day  . heparin  5,000 Units Subcutaneous 3 times per day  . potassium chloride  40 mEq Oral BID  . vancomycin  750 mg Intravenous Q24H   Continuous Infusions: . dextrose 5 % and 0.9% NaCl 1,000 mL (01/22/15 1155)    Principal Problem:   Rhabdomyolysis Active Problems:   Delirium   Pressure ulcer   Fall   Dysphagia    [facility], [facility] Pager [phone]. If  7PM-7AM, please contact night-coverage at [email or web address], password [facility] 01/22/2015, 3:54 PM  LOS: 6 days

## 2015-01-22 NOTE — Progress Notes (Signed)
Physical Therapy Treatment Patient Details Name: Jimmy Solis MRN: [id number] DOB: 05-08-1929 Today's Date: 01/22/2015    History of Present Illness KUNAAL WALKINS is a 81 y.o. male with h/o alzheimer's disease, lives alone at baseline. Patient's POA found him down at home in the bathroom for unknown length of time. Found to have acute rhabdomyolysis    PT Comments    Pt admitted with above diagnosis. Pt currently with functional limitations due to balance and endurance deficits. Pt mobility a lot worse today with pt increasing confusion.  Will continue PT as able.   Pt will benefit from skilled PT to increase their independence and safety with mobility to allow discharge to the venue listed below.    Follow Up Recommendations  SNF;Supervision/Assistance - 24 hour     Equipment Recommendations  None recommended by PT    Recommendations for Other Services       Precautions / Restrictions Precautions Precautions: Fall Restrictions Weight Bearing Restrictions: No    Mobility  Bed Mobility Overal bed mobility: Needs Assistance Bed Mobility: Supine to Sit     Supine to sit: Max assist Sit to supine: +2 for physical assistance;Total assist   General bed mobility comments: max assist for truncal control and to move LEs off bed. Pt anxious and guarded initially. Use of rail.  Transfers Overall transfer level: Needs assistance Equipment used: Rolling walker (2 wheeled);2 person hand held assist Transfers: Sit to/from [facility] Sit to Stand: Max assist;+2 physical assistance Stand pivot transfers: Mod assist;Max assist;+2 physical assistance       General transfer comment: Max assist for boost and balance due to posterior lean. Cues for hand placement and to shift weight anteriorly.  Tried to use RW without success as pt could not get anterior. Pt performed stand pivot to 3N1 first and then stand pivot back to bed.  Mod assist at times due to posterior lean  as well as significantly flexed posture.Pt with such flexed posture needed assist and cues to guide hips to 3N1 and bed and sit down with need for help to control descent into chair.   Ambulation/Gait                 Stairs            Wheelchair Mobility    Modified Rankin (Stroke Patients Only)       Balance Overall balance assessment: Needs assistance;History of Falls Sitting-balance support: Bilateral upper extremity supported;Feet supported Sitting balance-Leahy Scale: Poor Sitting balance - Comments: needed bil UE support   Standing balance support: Bilateral upper extremity supported;During functional activity Standing balance-Leahy Scale: Poor Standing balance comment: Pt needed bil UE support to stand at EOB.  Very flexed posture with pt unable to stand fully upright even with cues and assist.                     Cognition Arousal/Alertness: Awake/alert Behavior During Therapy: Anxious Overall Cognitive Status: No family/caregiver present to determine baseline cognitive functioning (per chart, alzheimer's disease at baseline)       Memory: Decreased short-term memory              Exercises      General Comments General comments (skin integrity, edema, etc.): Pt still unaware of  place, time or situation.  Much more confused today.  Mobility poorer as well. NT in room to asssit with bathing and cleaning once pt done with using 3N1.  Pertinent Vitals/Pain Pain Assessment: No/denies pain Faces Pain Scale: No hurt  VSS    Home Living                      Prior Function            PT Goals (current goals can now be found in the care plan section) Progress towards PT goals: Progressing toward goals    Frequency  Min 3X/week    PT Plan Current plan remains appropriate    Co-evaluation             End of Session Equipment Utilized During Treatment: Gait belt Activity Tolerance: Patient limited by  fatigue Patient left: in bed;with call bell/phone within reach;with bed alarm set (with ST in room)     Time: 2751-7001 PT Time Calculation (min) (ACUTE ONLY): 39 min  Charges:  $Therapeutic Activity: 23-37 mins $Self Care/Home Management: 8-22                    G CodesIrwin Brakeman F Feb 07, 2015, 12:49 PM Salah Nakamura,PT Acute Rehabilitation [phone] (pager)

## 2015-01-22 NOTE — Progress Notes (Signed)
Speech Language Pathology Treatment: Dysphagia  Patient Details Name: Jimmy Solis MRN: [id number] DOB: Aug 20, 1928 Today's Date: 01/22/2015 Time: 2707-8675 SLP Time Calculation (min) (ACUTE ONLY): 12 min  Assessment / Plan / Recommendation Clinical Impression  Pt presented with increased lethargy and slower processing (likely due to sedatives) requiring total assist to feed. Pt tolerated dys 1 (puree) diet, with mild s/sx of aspiration (delayed cough). SLP recommends pt continue with most restrictive diet (dys 1 and pudding thick) with full supervision to facilitate feeding and using compensations (small sips/bites, slow rate, and clear throat intermittently) only when the pt is alert. SLP will f/u with diet tolerance.    HPI Other Pertinent Information: Jimmy Solis is a 81 y.o. male with h/o alzheimer's disease, lives alone at baseline. Patient's POA found him down at home in the bathroom for unknown length of time. Patient dosent remember when he fell which is his baseline memory wise. Most likely happened in the past 24-48 hour time window (probably latter because of the number of ensures that were left in his fridge per his POA.  Diagnosed with rhabdomyelosis and delerium.     Pertinent Vitals Pain Assessment: Faces Faces Pain Scale: No hurt  SLP Plan  Continue with current plan of care    Recommendations Diet recommendations: Dysphagia 1 (puree);Pudding-thick liquid Liquids provided via: Teaspoon Medication Administration: Crushed with puree Supervision: Full supervision/cueing for compensatory strategies;Staff to assist with self feeding Compensations: Small sips/bites;Slow rate;Clear throat intermittently Postural Changes and/or Swallow Maneuvers: Seated upright 90 degrees              Oral Care Recommendations: Oral care QID Follow up Recommendations: 24 hour supervision/assistance;Skilled Nursing facility Plan: Continue with current plan of care    [place], [place] 01/22/2015, 10:00 AM

## 2015-01-22 NOTE — Consult Note (Addendum)
ANTIBIOTIC CONSULT NOTE  Pharmacy Consult for vancomycin Indication: rule out pneumonia  Allergies  Allergen Reactions  . Penicillins Hives  . Ativan [Lorazepam] Other (See Comments)    Hallucination, did calm patient down but he became confused / delirious after getting this (delerium may have already been going on before ativan was given, and ativan may have actually converted him from agitated delirium to non-agitated delirium)    Patient Measurements: Height: 5\' 4"  (162.6 cm) Weight: 115 lb 1.3 oz (52.2 kg) IBW/kg (Calculated) : 59.2  Vital Signs: Temp: 97.7 F (36.5 C) (09/15 0400) Temp Source: Axillary (09/15 0400) BP: 156/71 mmHg (09/15 0802) Pulse Rate: 83 (09/15 0802) Intake/Output from previous day: 09/14 0701 - 09/15 0700 In: 350 [IV Piggyback:350] Out: 250 [Urine:250] Intake/Output from this shift:    Labs:  Recent Labs  01/20/15 0306 01/21/15 0235 01/22/15 0248  WBC 8.7 8.7 7.3  HGB 11.9* 11.3* 12.0*  PLT 148* 167 191  CREATININE 0.93 0.79 0.92   Estimated Creatinine Clearance: 44.1 mL/min (by C-G formula based on Cr of 0.92). No results for input(s): VANCOTROUGH, VANCOPEAK, VANCORANDOM, GENTTROUGH, GENTPEAK, GENTRANDOM, TOBRATROUGH, TOBRAPEAK, TOBRARND, AMIKACINPEAK, AMIKACINTROU, AMIKACIN in the last 72 hours.   Assessment: 81 yo male admitted after being found down for unknown length of time on 9/9 (most likely 24-48 hrs). Started on vancomycin and aztreonam (history of penicillin allergy) for suspected PNA/aspiration PNA.  WBC wnl, remains afebrile.  Aztreonam 9/12>> (9/20) - (per MD) Vancomycin 9/12>>(9/20)  9/11 MRSA PCR neg 9/12 Blood x 2 9/12 Sputum  SCr remains stable at 0.92, CrCl ~40-85mL/min. Note the patient is recovering from rhabdo. CK continues to trend down (Last CK was 949 on 9/13)  Goal of Therapy:  Vancomycin trough level 15-20 mcg/ml  Plan:  - continue Vancomycin then 750 mg q24h - Aztreonam 2g IV q8h - stop dates of  9/19 entered for both antibiotics- 8 days total of therapy - Monitor clinical status, renal function, culture results, VT prn  Oscar Hank D. Meyli Boice, PharmD, BCPS Clinical Pharmacist Pager: [phone] 01/22/2015 10:54 AM

## 2015-01-23 ENCOUNTER — Telehealth: Payer: Self-pay | Admitting: Medical

## 2015-01-23 DIAGNOSIS — J690 Pneumonitis due to inhalation of food and vomit: Secondary | ICD-10-CM

## 2015-01-23 DIAGNOSIS — W19XXXA Unspecified fall, initial encounter: Secondary | ICD-10-CM

## 2015-01-23 LAB — CBC
HCT: 34.5 % — ABNORMAL LOW (ref 39.0–52.0)
Hemoglobin: 11.7 g/dL — ABNORMAL LOW (ref 13.0–17.0)
MCH: 33.6 pg (ref 26.0–34.0)
MCHC: 33.9 g/dL (ref 30.0–36.0)
MCV: 99.1 fL (ref 78.0–100.0)
PLATELETS: 202 10*3/uL (ref 150–400)
RBC: 3.48 MIL/uL — AB (ref 4.22–5.81)
RDW: 14.6 % (ref 11.5–15.5)
WBC: 5.5 10*3/uL (ref 4.0–10.5)

## 2015-01-23 LAB — BASIC METABOLIC PANEL
Anion gap: 7 (ref 5–15)
Anion gap: 8 (ref 5–15)
BUN: 11 mg/dL (ref 6–20)
BUN: 13 mg/dL (ref 6–20)
CALCIUM: 8.5 mg/dL — AB (ref 8.9–10.3)
CO2: 29 mmol/L (ref 22–32)
CO2: 27 mmol/L (ref 22–32)
CREATININE: 0.71 mg/dL (ref 0.61–1.24)
CREATININE: 0.86 mg/dL (ref 0.61–1.24)
Calcium: 8.6 mg/dL — ABNORMAL LOW (ref 8.9–10.3)
Chloride: 107 mmol/L (ref 101–111)
Chloride: 104 mmol/L (ref 101–111)
GFR calc Af Amer: >60 mL/min (ref >60)
GFR calc non Af Amer: >60 mL/min (ref >60)
GLUCOSE: 156 mg/dL — AB (ref 65–99)
Glucose, Bld: 119 mg/dL — ABNORMAL HIGH (ref 65–99)
POTASSIUM: 3.9 mmol/L (ref 3.5–5.1)
Potassium: 3 mmol/L — ABNORMAL LOW (ref 3.5–5.1)
SODIUM: 143 mmol/L (ref 135–145)
Sodium: 139 mmol/L (ref 135–145)

## 2015-01-23 LAB — GLUCOSE, CAPILLARY
GLUCOSE-CAPILLARY: 131 mg/dL — AB (ref 65–99)
GLUCOSE-CAPILLARY: 113 mg/dL — AB (ref 65–99)
Glucose-Capillary: 124 mg/dL — ABNORMAL HIGH (ref 65–99)
Glucose-Capillary: 104 mg/dL — ABNORMAL HIGH (ref 65–99)
Glucose-Capillary: 147 mg/dL — ABNORMAL HIGH (ref 65–99)

## 2015-01-23 LAB — MAGNESIUM: MAGNESIUM: 1.6 mg/dL — AB (ref 1.7–2.4)

## 2015-01-23 MED ORDER — ENSURE ENLIVE PO LIQD: 237.0000 mL | Freq: Two times a day (BID) | ORAL | Status: DC

## 2015-01-23 MED ORDER — ENSURE ENLIVE PO LIQD
237.0000 mL | Freq: Two times a day (BID) | ORAL | Status: DC
Start: 2015-01-23 — End: 2015-01-23
  Administered 2015-01-23: 237 mL via ORAL

## 2015-01-23 MED ORDER — MAGNESIUM SULFATE 2 GM/50ML IV SOLN
2.0000 g | Freq: Once | INTRAVENOUS | Status: AC
  Administered 2015-01-23: 2 g via INTRAVENOUS
  Filled 2015-01-23: qty 50

## 2015-01-23 MED ORDER — CEFUROXIME AXETIL 250 MG PO TABS
250.0000 mg | ORAL_TABLET | Freq: Two times a day (BID) | ORAL | Status: DC
  Administered 2015-01-23: 250 mg via ORAL
  Filled 2015-01-23 (×3): qty 1

## 2015-01-23 MED ORDER — POTASSIUM CHLORIDE 10 MEQ/100ML IV SOLN
10.0000 meq | INTRAVENOUS | Status: AC
  Filled 2015-01-23: qty 100

## 2015-01-23 MED ORDER — POTASSIUM CHLORIDE 10 MEQ/100ML IV SOLN
10.0000 meq | INTRAVENOUS | Status: DC
  Administered 2015-01-23 (×2): 10 meq via INTRAVENOUS
  Filled 2015-01-23 (×2): qty 100

## 2015-01-23 MED ORDER — POTASSIUM CHLORIDE 20 MEQ/15ML (10%) PO SOLN
40.0000 meq | Freq: Once | ORAL | Status: AC
  Administered 2015-01-23: 40 meq via ORAL
  Filled 2015-01-23: qty 30

## 2015-01-23 MED ORDER — AMLODIPINE BESYLATE 5 MG PO TABS: 5.0000 mg | ORAL_TABLET | Freq: Every day | ORAL | Status: DC

## 2015-01-23 MED ORDER — POTASSIUM CHLORIDE 20 MEQ/15ML (10%) PO SOLN: 20.0000 meq | Freq: Every day | ORAL | Status: DC

## 2015-01-23 MED ORDER — CEFUROXIME AXETIL 250 MG PO TABS: 250.0000 mg | ORAL_TABLET | Freq: Two times a day (BID) | ORAL | Status: DC

## 2015-01-23 NOTE — Progress Notes (Signed)
Nurse called from [facility]- notified her not necessary to draw BMP tomorrow.

## 2015-01-23 NOTE — Progress Notes (Signed)
PIV removed-pt tol well. Tele removed-CCMD notified. Mitts removed.

## 2015-01-23 NOTE — Discharge Summary (Addendum)
Physician Discharge Summary  [name]:[id number] DOB: [birth year] DOA: 01/16/2015  PCP: [name], PA-C  Admit date: 01/16/2015 Discharge date: 01/23/2015  Time spent: 20 minutes  Recommendations for Outpatient Follow-up:  1. Follow up with PCP in 1-2 weeks 2. Please recheck renal panel in 1-2 days, focus on potassium 3. Would also re-check renal panel in 1 week, focus on potassium, also recheck magnesium in 1 week 4. Please keep head of bed elevated over 30 degrees  Discharge Diagnoses:  Principal Problem:   Rhabdomyolysis Active Problems:   Delirium   Pressure ulcer   Fall   Dysphagia   Aspiration pneumonia   Discharge Condition: Improved  Diet recommendation: Dysphagia 1, with pudding thick liquids  Filed Weights   01/17/15 1900 01/18/15 2032 01/22/15 1257  Weight: 51.4 kg (113 lb 5.1 oz) 52.2 kg (115 lb 1.3 oz) 52.87 kg (116 lb 8.9 oz)    History of present illness:  Please see admit h and p from 9/9 for details. Briefly, pt presented after being found down at home for an unknown length of time. Pt found to have evidence of rhabdomyolysis and was admitted for further work up.  Hospital Course:  Sepsis secondary to HCAP vs aspiration pneumonia -During this course, patient became febrile with tachycardia and tachypnea -CXR noted to have right airspace consolidation -Patient was continued on vancomycin and aztreonam (PCN allergy) -Strep pneumonia urine Ag negative, neg legionella urine Ag -HIV negative -Blood cultures neg thus far - Pt's O2 requirements improved -Discussed case with pharmacy. Patient would benefit from 2 more days of abx. Given PCN allergy and QTc of 452, recommendations for ceftin to complete course. One dose was given in hospital with no ill effects.  Acute rhabdomyolysis, nontraumatic -CPK on admission was >21K, improved to 949 with IVF  Acute delirium vs advanced dementia -CT head negative for acute intracranial abnormality -Pt  noted to have hx of marked dementia that has been getting worse -Met with pt's POA with concerns that pt's appetite will likely continue to worsen as his dementia progresses. Recommend encouraging PO intake  ?Dypshagia -Speech therapy consulted, recommended NPO initially. Pt has since been cleared for puree with pudding thick liquids  -Please keep head of bed elevated over 30 degrees  Elevated troponin -Likely secondary to rhabdomyolysis -Continue to cycle, trending downward, peak 0.09 -EKG showed no ischemic changes  Fall -Per H&P, patient has been falling recently -PT/OT consulted- rec for SNF  Hypokalemia -Would continue to replace -Magnesium 1.8, - Discussed with POA in room. Low electrolytes are likely secondary to markedly poor PO intake. Pt historically only drinks ensure and eats bread. Recommend encouraging proper PO intake  Goals of care -Dr. [name] had spoken with POA regarding Code status and possible need for feeding tube. Patient's POA stated no life saving measures, code status is DNR  HTN - SBP noted to be in the 170 range - Have added low dose amlodipine at $RemoveBefor'5mg'foZSMwZCKsSK$  daily  Pressure ulcer - Unable to Clinically Determine   Discharge Exam: Filed Vitals:   01/23/15 1000 01/23/15 1425 01/23/15 1528 01/23/15 1543  BP: 138/76 170/95 176/94 176/94  Pulse: 58 83 91   Temp: 97.8 F (36.6 C) 98.1 F (36.7 C) 97.7 F (36.5 C)   TempSrc: Oral Oral Oral   Resp:  20 20   Height:      Weight:      SpO2:  96% 99%     General: Awake, in nad Cardiovascular: regular, s1, s2  Respiratory: normal resp effort, no wheezing  Discharge Instructions     Medication List    TAKE these medications        amLODipine 5 MG tablet  Commonly known as:  NORVASC  Take 1 tablet (5 mg total) by mouth daily.     aspirin 81 MG tablet  Take 81 mg by mouth daily.     cefUROXime 250 MG tablet  Commonly known as:  CEFTIN  Take 1 tablet (250 mg total) by mouth 2 (two) times daily  with a meal.     feeding supplement (ENSURE ENLIVE) Liqd  Take 237 mLs by mouth 2 (two) times daily between meals.     potassium chloride 20 MEQ/15ML (10%) Soln  Take 15 mLs (20 mEq total) by mouth daily.       Allergies  Allergen Reactions  . Penicillins Hives  . Ativan [Lorazepam] Other (See Comments)    Hallucination, did calm patient down but he became confused / delirious after getting this (delerium may have already been going on before ativan was given, and ativan may have actually converted him from agitated delirium to non-agitated delirium)      The results of significant diagnostics from this hospitalization (including imaging, microbiology, ancillary and laboratory) are listed below for reference.    Significant Diagnostic Studies: Dg Shoulder Right  01/16/2015   CLINICAL DATA:  Status post fall in bathroom today with a blow to the right shoulder. Pain. Initial encounter.  EXAM: RIGHT SHOULDER - 2+ VIEW  COMPARISON:  None.  FINDINGS: There is no evidence of fracture or dislocation. There is no evidence of arthropathy or other focal bone abnormality. Soft tissues are unremarkable.  IMPRESSION: Negative exam.   Electronically Signed   By: Inge Rise M.D.   On: 01/16/2015 11:38   Ct Head Wo Contrast  01/16/2015   CLINICAL DATA:  Found down, unresponsive  EXAM: CT HEAD WITHOUT CONTRAST  CT CERVICAL SPINE WITHOUT CONTRAST  TECHNIQUE: Multidetector CT imaging of the head and cervical spine was performed following the standard protocol without intravenous contrast. Multiplanar CT image reconstructions of the cervical spine were also generated.  COMPARISON:  Head CT 10/16/2011, brain MRI images only 12/17/2014  FINDINGS: CT HEAD FINDINGS  Evidence of left scleral banding. Mild cortical volume loss noted with proportional ventricular prominence. Areas of periventricular white matter hypodensity are most compatible with small vessel ischemic change. No acute hemorrhage, infarct, or  mass lesion is identified. No midline shift. Orbits and paranasal sinuses otherwise intact. No skull fracture.  CT CERVICAL SPINE FINDINGS  C1 through the cervicothoracic junction is visualized in its entirety. Moderate multilevel disc degenerative change noted with bilateral neural foraminal narrowing by uncovertebral and hypertrophy, most prominent at C5-C6 and C3-C4. No fracture or dislocation. Multilevel facet osteoarthritic change. Vascular calcifications. Lung apices are grossly clear.  IMPRESSION: No acute intracranial abnormality.  Chronic changes as above.  No cervical spine fracture or dislocation.   Electronically Signed   By: Conchita Paris M.D.   On: 01/16/2015 12:46   Ct Cervical Spine Wo Contrast  01/16/2015   CLINICAL DATA:  Found down, unresponsive  EXAM: CT HEAD WITHOUT CONTRAST  CT CERVICAL SPINE WITHOUT CONTRAST  TECHNIQUE: Multidetector CT imaging of the head and cervical spine was performed following the standard protocol without intravenous contrast. Multiplanar CT image reconstructions of the cervical spine were also generated.  COMPARISON:  Head CT 10/16/2011, brain MRI images only 12/17/2014  FINDINGS: CT HEAD FINDINGS  Evidence of  left scleral banding. Mild cortical volume loss noted with proportional ventricular prominence. Areas of periventricular white matter hypodensity are most compatible with small vessel ischemic change. No acute hemorrhage, infarct, or mass lesion is identified. No midline shift. Orbits and paranasal sinuses otherwise intact. No skull fracture.  CT CERVICAL SPINE FINDINGS  C1 through the cervicothoracic junction is visualized in its entirety. Moderate multilevel disc degenerative change noted with bilateral neural foraminal narrowing by uncovertebral and hypertrophy, most prominent at C5-C6 and C3-C4. No fracture or dislocation. Multilevel facet osteoarthritic change. Vascular calcifications. Lung apices are grossly clear.  IMPRESSION: No acute intracranial  abnormality.  Chronic changes as above.  No cervical spine fracture or dislocation.   Electronically Signed   By: Conchita Paris M.D.   On: 01/16/2015 12:46   Dg Chest Port 1 View  01/19/2015   CLINICAL DATA:  Shortness of breath and cough ; concern for aspiration  EXAM: PORTABLE CHEST - 1 VIEW  COMPARISON:  November 01, 2005  FINDINGS: There is patchy airspace consolidation in the right base. Lungs elsewhere clear. Heart size and pulmonary vascularity are normal. No adenopathy. No bone lesions.  IMPRESSION: Right base airspace consolidation. Major differential considerations include pneumonia and aspiration peer lungs elsewhere clear. Cardiac silhouette within normal limits.   Electronically Signed   By: Lowella Grip III M.D.   On: 01/19/2015 13:59   Dg Swallowing Func-speech Pathology  01/19/2015    Objective Swallowing Evaluation:    Patient Details  Name: Jimmy Solis MRN: [id number] Date of Birth: 02-27-1929  Today's Date: 01/19/2015 Time: SLP Start Time (ACUTE ONLY): 1310-SLP Stop Time (ACUTE ONLY): 1325 SLP Time Calculation (min) (ACUTE ONLY): 15 min  Past Medical History:  Past Medical History  Diagnosis Date  . Retinal detachment     left  . 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 Alzheimers disease   . Stroke ~ 12/2014    "lost vision in right eye"  . Blind right eye   . Depression     hx  . Prostate cancer   . Cancer of skin of back   . Falls frequently     "probably once/month" (01/16/2015)  . TIA (transient ischemic attack)     "several over the last couple years; brain scans have reflected that;  sees /Dr. Jannifer Franklin" (01/16/2015)   Past Surgical History:  Past Surgical History  Procedure Laterality Date  . Prostatectomy  ~ 2001  . Cataract extraction w/ intraocular lens  implant, bilateral Bilateral ~  2004  . Retinal detachment surgery Left ~ 2006  . Tonsillectomy    . Eye surgery    . Hernia repair    . Abdominal hernia repair  ~ 2003  . Skin cancer excision  ~ 2013    "back"   HPI:   Other Pertinent Information: TONY GRANQUIST is a 81 y.o. male with h/o  alzheimer's disease, lives alone at baseline. Patient's POA found him  down at home in the bathroom for unknown length of time. Patient dosent  remember when he fell which is his baseline memory wise. Most likely  happened in the past 24-48 hour time window (probably latter because of  the number of ensures that were left in his fridge per his POA.  Diagnosed  with rhabdomyelosis and delerium.    No Data Recorded  Assessment / Plan / Recommendation CHL IP CLINICAL IMPRESSIONS 01/19/2015  Therapy Diagnosis Mild oral phase dysphagia;Severe pharyngeal phase  dysphagia  Clinical Impression MBSS was  completed.  The patient presented with mild  oral and severe pharyngeal dysphagia characterized by delayed oral transit  with poor bolus cohesion, delayed swallow trigger, decreased airway  closure, decreased epiglottic inversion with cricopharyngeal dysfunction  with pyriform residue across all textures.  The patient penetrated during  the swallow across textures.  However, there was penetration/aspiration  across textures after the swallow from the pyriform residue that appeared  to be due to cricopharyngeal dysfunction.  Safest diet is NPO.  ST to  follow for trial swallowing therapy.          Lamar Sprinkles 01/19/2015, 1:37 PM Shelly Flatten, MA, [facility] SLP [phone]     Microbiology: Recent Results (from the past 240 hour(s))  MRSA PCR Screening     Status: None   Collection Time: 01/18/15  9:00 PM  Result Value Ref Range Status   MRSA by PCR NEGATIVE NEGATIVE Final    Comment:        The GeneXpert MRSA Assay (FDA approved for NASAL specimens only), is one component of a comprehensive MRSA colonization surveillance program. It is not intended to diagnose MRSA infection nor to guide or monitor treatment for MRSA infections.   Culture, blood (routine x 2) Call MD if unable to obtain prior to antibiotics being  given     Status: None (Preliminary result)   Collection Time: 01/19/15  4:45 PM  Result Value Ref Range Status   Specimen Description BLOOD LEFT ANTECUBITAL  Final   Special Requests BOTTLES DRAWN AEROBIC AND ANAEROBIC 10CC   Final   Culture NO GROWTH 4 DAYS  Final   Report Status PENDING  Incomplete  Culture, blood (routine x 2) Call MD if unable to obtain prior to antibiotics being given     Status: None (Preliminary result)   Collection Time: 01/19/15  4:52 PM  Result Value Ref Range Status   Specimen Description BLOOD RIGHT HAND  Final   Special Requests BOTTLES DRAWN AEROBIC AND ANAEROBIC  5CC   Final   Culture NO GROWTH 4 DAYS  Final   Report Status PENDING  Incomplete     Labs: Basic Metabolic Panel:  Recent Labs Lab 01/19/15 0253 01/20/15 0306 01/21/15 0235 01/22/15 0248 01/22/15 1606 01/23/15 0731  NA 141 139 137 142 142 143  K 3.4* 3.4* 3.7 2.8* 2.9* 3.0*  CL 113* 110 107 107 107 107  CO2 19* 21* $Remov'23 27 27 29  'Mztins$ GLUCOSE 92 102* 92 125* 138* 119*  BUN $Re'16 16 13 12 18 11  'VqS$ CREATININE 0.98 0.93 0.79 0.92 0.96 0.71  CALCIUM 8.2* 8.0* 8.3* 8.7* 8.4* 8.5*  MG 1.8  --  1.9  --   --  1.6*   Liver Function Tests:  Recent Labs Lab 01/21/15 0235 01/22/15 0248  AST 44* 39  ALT 50 49  ALKPHOS 60 73  BILITOT 0.9 0.8  PROT 5.6* 6.3*  ALBUMIN 2.4* 2.6*   No results for input(s): LIPASE, AMYLASE in the last 168 hours. No results for input(s): AMMONIA in the last 168 hours. CBC:  Recent Labs Lab 01/19/15 0253 01/20/15 0306 01/21/15 0235 01/22/15 0248 01/23/15 0731  WBC 10.0 8.7 8.7 7.3 5.5  HGB 11.6* 11.9* 11.3* 12.0* 11.7*  HCT 33.8* 34.5* 33.4* 34.4* 34.5*  MCV 100.0 100.9* 99.7 98.6 99.1  PLT 171 148* 167 191 202   Cardiac Enzymes:  Recent Labs Lab 01/17/15 0502 01/17/15 1150 01/17/15 1842 01/17/15 2223 01/18/15 0430 01/20/15 0306  CKTOTAL 59563*  --   --   --  3626* 949*  TROPONINI  --  0.07* 0.06* 0.05*  --   --    BNP: BNP (last 3  results) No results for input(s): BNP in the last 8760 hours.  ProBNP (last 3 results) No results for input(s): PROBNP in the last 8760 hours.  CBG:  Recent Labs Lab 01/22/15 2028 01/23/15 0101 01/23/15 0532 01/23/15 0820 01/23/15 1230  GLUCAP 118* 131* 124* 113* 104*    Signed:  CHIU, STEPHEN K  [facility] 01/23/2015, 5:15 PM

## 2015-01-23 NOTE — Clinical Social Work Placement (Signed)
   CLINICAL SOCIAL WORK PLACEMENT  NOTE  Date:  01/23/2015  Patient Details  Name: Jimmy Solis MRN: [id number] Date of Birth: 01/10/1929  Clinical Social Work is seeking post-discharge placement for this patient at the [place] level of care (*CSW will initial, date and re-position this form in  chart as items are completed):  Yes   Patient/family provided with [place] Work Department's list of facilities offering this level of care within the geographic area requested by the patient (or if unable, by the patient's family).  Yes   Patient/family informed of their freedom to choose among providers that offer the needed level of care, that participate in Medicare, Medicaid or managed care program needed by the patient, have an available bed and are willing to accept the patient.  Yes   Patient/family informed of [facility]'s ownership interest in [facility] and [facility], as well as of the fact that they are under no obligation to receive care at these facilities.  PASRR submitted to EDS on 01/19/15     PASRR number received on 01/19/15     Existing PASRR number confirmed on       FL2 transmitted to all facilities in geographic area requested by pt/family on 01/19/15     FL2 transmitted to all facilities within larger geographic area on       Patient informed that his/her managed care company has contracts with or will negotiate with certain facilities, including the following:        Yes   Patient/family informed of bed offers received.  Patient [facility] bed at [facility]     Physician recommends and patient chooses bed at      Patient to be transferred to [facility] on 01/23/15.  Patient to be transferred to facility by Ambulance     Patient family notified on 01/23/15 of transfer.  Name of family member notified:  Ronalee Belts [facility]) - phone     PHYSICIAN Please sign FL2     Additional Comment:   Barbette Or,  [place]

## 2015-01-23 NOTE — Progress Notes (Signed)
Occupational Therapy Treatment Patient Details Name: Jimmy Solis MRN: [id number] DOB: [birth year] Today's Date: 01/23/2015    History of present illness Jimmy Solis is a 81 y.o. male with h/o alzheimer's disease, lives alone at baseline. Patient's POA found him down at home in the bathroom for unknown length of time. Found to have acute rhabdomyolysis   OT comments  Pt. With limited participation this session secondary to notable fatigue and lethargy.  Max/dep. With tactile guidance For engaging in tasks but did follow one step commands.  In/out of sleep.  Difficult to maintain arousal.  Will continue to follow acutely, agree with initial d/c recommendations  Follow Up Recommendations  SNF;Supervision/Assistance - 24 hour    Equipment Recommendations       Recommendations for Other Services      Precautions / Restrictions Precautions Precautions: Fall Restrictions Weight Bearing Restrictions: No       Mobility Bed Mobility                  Transfers                      Balance                                   ADL Overall ADL's : Needs assistance/impaired     Grooming: Oral care;Maximal assistance;Total assistance;Bed level                                 General ADL Comments: pt. lethargic this session.  attempted light grooming ub care.  pt. max/dep. for sequencing, and technique.  following one step commands but unable to engage without cues, in/out of sleep      Vision                     Perception     Praxis      Cognition     Overall Cognitive Status: No family/caregiver present to determine baseline cognitive functioning                       Extremity/Trunk Assessment               Exercises     Shoulder Instructions       General Comments      Pertinent Vitals/ Pain       Pain Assessment: No/denies pain  Home Living                                           Prior Functioning/Environment              Frequency Min 2X/week     Progress Toward Goals  OT Goals(current goals can now be found in the care plan section)  Progress towards OT goals: Progressing toward goals     Plan Discharge plan remains appropriate    Co-evaluation                 End of Session     Activity Tolerance Patient limited by lethargy;Patient limited by fatigue   Patient Left in bed;with call bell/phone within reach;with bed alarm set   Nurse Communication  Time: 9030-1499 OT Time Calculation (min): 9 min  Charges: OT General Charges $OT Visit: 1 Procedure OT Treatments $Self Care/Home Management : 8-22 mins  Janice Coffin, COTA/L 01/23/2015, 8:05 AM

## 2015-01-23 NOTE — Clinical Social Work Note (Signed)
Clinical Social Worker facilitated patient discharge including contacting patient family and facility to confirm patient discharge plans.  Clinical information faxed to facility and family agreeable with plan.  CSW arranged ambulance transport via [facility] to [facility].  RN to call report prior to discharge.  Clinical Social Worker will sign off for now as social work intervention is no longer needed. Please consult us again if new need arises.  Jesse Scinto, LCSW [phone] 

## 2015-01-23 NOTE — Clinical Documentation Improvement (Signed)
Internal Medicine  Can the diagnosis of pressure ulcer be further specified?   Document if pressure ulcer with stage is Present on Admission   Document Site with laterality - Elbow, Back (upper/lower), Sacral, Hip, Buttock, Ankle, Heel, Head, Other (Specify)  Pressure Ulcer Stage - Stage1, Stage 2, Stage 3, Stage 4, Unstageable, Unspecified, Unable to Clinically Determine  Other  Clinically Undetermined  Supporting Information: Current physician documentation notes 'pressure ulcer'.   Nursing flow sheets note the following on admission: left anterior ankle pressure ulcer stage 1, sacral pressure ulcer stage 1, and deep tissue injury right hip.  If you agree with the nursing assessment, please document in your future progress notes and discharge summary.    Please exercise your independent, professional judgment when responding. A specific answer is not anticipated or expected.  Thank you, Mateo Flow, RN [phone] Clinical Documentation Specialist

## 2015-01-23 NOTE — Progress Notes (Signed)
Patient discharged to [facility]. Transported via [facility]. Departed 01/23/15 at 1930 with belongings.

## 2015-01-23 NOTE — Progress Notes (Signed)
Initial Nutrition Assessment  DOCUMENTATION CODES:   Not applicable  INTERVENTION:   -Continue Ensure Enlive po BID, each supplement provides 350 kcal and 20 grams of protein (thicken to pudding thick consistency) -Magic Cup TID with meals  NUTRITION DIAGNOSIS:   Inadequate oral intake related to dysphagia as evidenced by meal completion < 25%.  GOAL:   Patient will meet greater than or equal to 90% of their needs  MONITOR:   PO intake, Supplement acceptance, Diet advancement, Labs, Weight trends, Skin, I & O's  REASON FOR ASSESSMENT:   Consult Poor PO  ASSESSMENT:   BRAYSEN CLOWARD is a 81 y.o. male with h/o alzheimer's disease, lives alone at baseline. Patient's POA found him down at home in the bathroom for unknown length of time. Patient dosent remember when he fell which is his baseline memory wise. Most likely happened in the past 24-48 hour time window (probably latter because of the number of ensures that were left in his fridge per his POA.  Pt admitted with acute rhabdomyolysis.   Pt asleep at time of visit. POA in with room with MD at time of visit. Unable to complete Nutrition-Focused physical exam at this time.   Noted distant hx of wt loss, however, no significant wt changes over the past 6 weeks.   Intake has been very poor this hospitalization. Reviewed SLP notes; pt with chronic aspiration and dysphagia. Pt is currently on a dysphagia 1 diet with pudding thick liquids (PO: 0-15%). Pt continues to exhibit signs of mild aspiration. Per POA, requesting no life saving measures.   Suspect poor intake PTA as well. Per MD, pt family reports pt only eats mashed/sweet potatoes, hawaiian rolls, soft veggies, and chocolate ensure. Suspect intake will continue to decline with restrictive diet. Will continue Ensure and add Magic Cup in attempt to promote nutritional adequacy.   Labs reviewed: K: 3.1 (on IV replacement). Mg: 1.6. CBGS: 104-124.   Diet Order:  DIET -  DYS 1 Room service appropriate?: Yes; Fluid consistency:: Pudding Thick  Skin:  Wound (see comment) (DTI rt hip, stage I lt ankle and sacrum)  Last BM:  01/22/15  Height:   Ht Readings from Last 1 Encounters:  01/18/15 5\' 4"  (1.626 m)    Weight:   Wt Readings from Last 1 Encounters:  01/22/15 116 lb 8.9 oz (52.87 kg)    Ideal Body Weight:  59.1 kg  BMI:  Body mass index is 20 kg/(m^2).  Estimated Nutritional Needs:   Kcal:  1500-1700  Protein:  65-80 grams  Fluid:  >1.5 L  EDUCATION NEEDS:   No education needs identified at this time  Jenifer A. Jimmye Norman, RD, LDN, CDE Pager: [phone] After hours Pager: [phone]

## 2015-01-23 NOTE — Care Management Important Message (Signed)
Important Message  Patient Details  Name: Jimmy Solis MRN: [id number] Date of Birth: 07/21/1928   Medicare Important Message Given:  Yes-third notification given    Nathen May 01/23/2015, 11:39 AM

## 2015-01-23 NOTE — Progress Notes (Signed)
Report called to [facility], Sunday Spillers [phone]. [place] to check Basic Met Panel. Pt leaving w foam dressings in place-sacrum, heels/ankles, R hip.

## 2015-01-23 NOTE — Care Management Note (Signed)
Case Management Note  Patient Details  Name: EXZAVIER RUDERMAN MRN: [id number] Date of Birth: Oct 04, 1928  Subjective/Objective:                 Patient expected to discharge to SNF. SW following and arranging discharge.   Action/Plan:   Expected Discharge Date:                  Expected Discharge Plan:  Skilled Nursing Facility  In-House Referral:  Clinical Social Work  Discharge planning Services  CM Consult  Post Acute Care Choice:    Choice offered to:     DME Arranged:    DME Agency:     HH Arranged:    [place] Agency:     Status of Service:  Completed, signed off  Medicare Important Message Given:  Yes-third notification given Date Medicare IM Given:    Medicare IM give by:    Date Additional Medicare IM Given:    Additional Medicare Important Message give by:     If discussed at [place] of Stay Meetings, dates discussed:    Additional Comments:  Carles Collet, RN 01/23/2015, 1:34 PM

## 2015-01-23 NOTE — Telephone Encounter (Signed)
This pt is new. It looks like he will needs 45 minute appointment. If he is sheduled with me make sure he gets 45 minutes. Hosptial follow up new pt. He looked like he was really sick.

## 2015-01-24 LAB — CULTURE, BLOOD (ROUTINE X 2)
Culture: NO GROWTH
Culture: NO GROWTH

## 2015-01-25 NOTE — Telephone Encounter (Signed)
Reviewed note sent from ED. But I have never seen this pt in office at [facility] on chart review. Nor do I remember seeing him in any other clinic.

## 2015-01-26 ENCOUNTER — Encounter: Payer: Self-pay | Admitting: Adult Health

## 2015-01-26 ENCOUNTER — Non-Acute Institutional Stay: Payer: Medicare PPO | Admitting: Adult Health

## 2015-01-26 DIAGNOSIS — I10 Essential (primary) hypertension: Secondary | ICD-10-CM

## 2015-01-26 DIAGNOSIS — R1310 Dysphagia, unspecified: Secondary | ICD-10-CM

## 2015-01-26 DIAGNOSIS — R531 Weakness: Secondary | ICD-10-CM

## 2015-01-26 DIAGNOSIS — J189 Pneumonia, unspecified organism: Secondary | ICD-10-CM

## 2015-01-26 DIAGNOSIS — E876 Hypokalemia: Secondary | ICD-10-CM

## 2015-01-26 DIAGNOSIS — R413 Other amnesia: Secondary | ICD-10-CM

## 2015-01-26 DIAGNOSIS — W19XXXS Unspecified fall, sequela: Secondary | ICD-10-CM

## 2015-01-26 DIAGNOSIS — E43 Unspecified severe protein-calorie malnutrition: Secondary | ICD-10-CM | POA: Diagnosis not present

## 2015-01-26 NOTE — Telephone Encounter (Signed)
Will follow up if pt comes in for an appointment.

## 2015-01-28 ENCOUNTER — Non-Acute Institutional Stay (SKILLED_NURSING_FACILITY): Payer: Medicare PPO | Admitting: Internal Medicine

## 2015-01-28 DIAGNOSIS — W19XXXA Unspecified fall, initial encounter: Secondary | ICD-10-CM

## 2015-01-28 DIAGNOSIS — E46 Unspecified protein-calorie malnutrition: Secondary | ICD-10-CM | POA: Diagnosis not present

## 2015-01-28 DIAGNOSIS — R5381 Other malaise: Secondary | ICD-10-CM

## 2015-01-28 DIAGNOSIS — L8990 Pressure ulcer of unspecified site, unspecified stage: Secondary | ICD-10-CM

## 2015-01-28 DIAGNOSIS — I10 Essential (primary) hypertension: Secondary | ICD-10-CM

## 2015-01-28 DIAGNOSIS — E876 Hypokalemia: Secondary | ICD-10-CM | POA: Diagnosis not present

## 2015-01-28 DIAGNOSIS — R413 Other amnesia: Secondary | ICD-10-CM

## 2015-01-28 DIAGNOSIS — J189 Pneumonia, unspecified organism: Secondary | ICD-10-CM

## 2015-01-28 DIAGNOSIS — R1310 Dysphagia, unspecified: Secondary | ICD-10-CM | POA: Diagnosis not present

## 2015-01-28 NOTE — Progress Notes (Signed)
Patient ID: Jimmy Solis, male   DOB: 05-20-1928, 81 y.o.   MRN: [id number]     [facility]   PCP: [name], PA-C  Code Status: DNR  Allergies  Allergen Reactions  . Penicillins Hives  . Ativan [Lorazepam] Other (See Comments)    Hallucination, did calm patient down but he became confused / delirious after getting this (delerium may have already been going on before ativan was given, and ativan may have actually converted him from agitated delirium to non-agitated delirium)    Chief Complaint  Patient presents with  . New Admit To SNF     HPI:  81 y.o. patient is here for short term rehabilitation post hospital admission from 01/16/15-01/23/15 post fall with rhabdomylosis and sepsis secondary to HCAP vs aspiration pneumonia. He responded well to iv fluid and antibiotic. He also had acute delirium worsened by his dementia. He is seen in his room today. He is alert and oriented to person only. He complaints of soreness in his buttock and lower back. Denies any other concerns. As per staff, had a fall this am while trying to get out of his wheelchair unassisted. No apparent injury.  Review of Systems:  Constitutional: Negative for fever, chills, diaphoresis.  HENT: Negative for headache, congestion, nasal discharge Respiratory: Negative for cough, shortness of breath and wheezing.   Cardiovascular: Negative for chest pain, palpitations, leg swelling.  Gastrointestinal: Negative for heartburn, nausea, vomiting, abdominal pain. Had bowel movement this am Genitourinary: Negative for dysuria Musculoskeletal: on wheelchair Skin: Negative for itching, rash.  Neurological: Negative for dizziness, tingling, focal weakness Psychiatric/Behavioral: Negative for depression   Past Medical History  Diagnosis Date  . Retinal detachment     left  . 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 Alzheimers disease   . Stroke ~ 12/2014    "lost  vision in right eye"  . Blind right eye   . Depression     hx  . Prostate cancer   . Cancer of skin of back   . Falls frequently     "probably once/month" (01/16/2015)  . TIA (transient ischemic attack)     "several over the last couple years; brain scans have reflected that; sees /Dr. Jannifer Franklin" (01/16/2015)   Past Surgical History  Procedure Laterality Date  . Prostatectomy  ~ 2001  . Cataract extraction w/ intraocular lens  implant, bilateral Bilateral ~ 2004  . Retinal detachment surgery Left ~ 2006  . Tonsillectomy    . Eye surgery    . Hernia repair    . Abdominal hernia repair  ~ 2003  . Skin cancer excision  ~ 2013    "back"   Social History:   reports that he has never smoked. He has never used smokeless tobacco. He reports that he does not drink alcohol or use illicit drugs.  Family History  Problem Relation Age of Onset  . Stroke Mother   . Lung cancer Brother   . Tuberculosis Father     Medications:   Medication List       This list is accurate as of: 01/28/15  3:12 PM.  Always use your most recent med list.               amLODipine 5 MG tablet  Commonly known as:  NORVASC  Take 1 tablet (5 mg total) by mouth daily.     aspirin 81 MG tablet  Take 81 mg by mouth daily.  cefUROXime 250 MG tablet  Commonly known as:  CEFTIN  Take 1 tablet (250 mg total) by mouth 2 (two) times daily with a meal.     feeding supplement (ENSURE ENLIVE) Liqd  Take 237 mLs by mouth 2 (two) times daily between meals.     potassium chloride 20 MEQ/15ML (10%) Soln  Take 15 mLs (20 mEq total) by mouth daily.         Physical Exam: Filed Vitals:   01/28/15 1511  BP: 134/79  Pulse: 88  Temp: 98.7 F (37.1 C)  Resp: 16  Weight: 111 lb 9.6 oz (50.621 kg)  SpO2: 98%    General- elderly male, frail and thin built, in no acute distress Head- normocephalic, atraumatic Nose- normal nasal mucosa, no maxillary or frontal sinus tenderness, no nasal discharge Throat-  moist mucus membrane  Eyes- PERRLA, EOMI, no pallor, no icterus, no discharge, normal conjunctiva, normal sclera Neck- no cervical lymphadenopathy, no jugular vein distension Cardiovascular- normal s1,s2, no murmurs, no leg edema Respiratory- bilateral clear to auscultation, no wheeze, no rhonchi, no crackles, no use of accessory muscles Abdomen- bowel sounds present, soft, non tender Musculoskeletal- able to move all 4 extremities, generalized weakness  Neurological- no focal deficit, alert and oriented to person only Skin- warm and dry, pressure ulcer and SDTI present Psychiatry- normal mood and affect    Labs reviewed: Basic Metabolic Panel:  Recent Labs  01/19/15 0253  01/21/15 0235  01/22/15 1606 01/23/15 0731 01/23/15 1709  NA 141  < > 137  < > 142 143 139  K 3.4*  < > 3.7  < > 2.9* 3.0* 3.9  CL 113*  < > 107  < > 107 107 104  CO2 19*  < > 23  < > 27 29 27   GLUCOSE 92  < > 92  < > 138* 119* 156*  BUN 16  < > 13  < > 18 11 13   CREATININE 0.98  < > 0.79  < > 0.96 0.71 0.86  CALCIUM 8.2*  < > 8.3*  < > 8.4* 8.5* 8.6*  MG 1.8  --  1.9  --   --  1.6*  --   < > = values in this interval not displayed. Liver Function Tests:  Recent Labs  01/21/15 0235 01/22/15 0248  AST 44* 39  ALT 50 49  ALKPHOS 60 73  BILITOT 0.9 0.8  PROT 5.6* 6.3*  ALBUMIN 2.4* 2.6*   No results for input(s): LIPASE, AMYLASE in the last 8760 hours. No results for input(s): AMMONIA in the last 8760 hours. CBC:  Recent Labs  01/16/15 1250  01/21/15 0235 01/22/15 0248 01/23/15 0731  WBC 12.1*  < > 8.7 7.3 5.5  NEUTROABS 9.9*  --   --   --   --   HGB 15.2  < > 11.3* 12.0* 11.7*  HCT 44.3  < > 33.4* 34.4* 34.5*  MCV 98.7  < > 99.7 98.6 99.1  PLT 221  < > 167 191 202  < > = values in this interval not displayed. Cardiac Enzymes:  Recent Labs  01/17/15 0502 01/17/15 1150 01/17/15 1842 01/17/15 2223 01/18/15 0430 01/20/15 0306  CKTOTAL 19147*  --   --   --  3626* 949*  TROPONINI   --  0.07* 0.06* 0.05*  --   --    BNP: Invalid input(s): POCBNP CBG:  Recent Labs  01/23/15 0820 01/23/15 1230 01/23/15 1729  GLUCAP 113* 104* 147*  Radiological Exams: Dg Shoulder Right  01/16/2015   CLINICAL DATA:  Status post fall in bathroom today with a blow to the right shoulder. Pain. Initial encounter.  EXAM: RIGHT SHOULDER - 2+ VIEW  COMPARISON:  None.  FINDINGS: There is no evidence of fracture or dislocation. There is no evidence of arthropathy or other focal bone abnormality. Soft tissues are unremarkable.  IMPRESSION: Negative exam.   Electronically Signed   By: Inge Rise M.D.   On: 01/16/2015 11:38   Ct Head Wo Contrast  01/16/2015   CLINICAL DATA:  Found down, unresponsive  EXAM: CT HEAD WITHOUT CONTRAST  CT CERVICAL SPINE WITHOUT CONTRAST  TECHNIQUE: Multidetector CT imaging of the head and cervical spine was performed following the standard protocol without intravenous contrast. Multiplanar CT image reconstructions of the cervical spine were also generated.  COMPARISON:  Head CT 10/16/2011, brain MRI images only 12/17/2014  FINDINGS: CT HEAD FINDINGS  Evidence of left scleral banding. Mild cortical volume loss noted with proportional ventricular prominence. Areas of periventricular white matter hypodensity are most compatible with small vessel ischemic change. No acute hemorrhage, infarct, or mass lesion is identified. No midline shift. Orbits and paranasal sinuses otherwise intact. No skull fracture.  CT CERVICAL SPINE FINDINGS  C1 through the cervicothoracic junction is visualized in its entirety. Moderate multilevel disc degenerative change noted with bilateral neural foraminal narrowing by uncovertebral and hypertrophy, most prominent at C5-C6 and C3-C4. No fracture or dislocation. Multilevel facet osteoarthritic change. Vascular calcifications. Lung apices are grossly clear.  IMPRESSION: No acute intracranial abnormality.  Chronic changes as above.  No cervical spine  fracture or dislocation.   Electronically Signed   By: Conchita Paris M.D.   On: 01/16/2015 12:46   Ct Cervical Spine Wo Contrast  01/16/2015   CLINICAL DATA:  Found down, unresponsive  EXAM: CT HEAD WITHOUT CONTRAST  CT CERVICAL SPINE WITHOUT CONTRAST  TECHNIQUE: Multidetector CT imaging of the head and cervical spine was performed following the standard protocol without intravenous contrast. Multiplanar CT image reconstructions of the cervical spine were also generated.  COMPARISON:  Head CT 10/16/2011, brain MRI images only 12/17/2014  FINDINGS: CT HEAD FINDINGS  Evidence of left scleral banding. Mild cortical volume loss noted with proportional ventricular prominence. Areas of periventricular white matter hypodensity are most compatible with small vessel ischemic change. No acute hemorrhage, infarct, or mass lesion is identified. No midline shift. Orbits and paranasal sinuses otherwise intact. No skull fracture.  CT CERVICAL SPINE FINDINGS  C1 through the cervicothoracic junction is visualized in its entirety. Moderate multilevel disc degenerative change noted with bilateral neural foraminal narrowing by uncovertebral and hypertrophy, most prominent at C5-C6 and C3-C4. No fracture or dislocation. Multilevel facet osteoarthritic change. Vascular calcifications. Lung apices are grossly clear.  IMPRESSION: No acute intracranial abnormality.  Chronic changes as above.  No cervical spine fracture or dislocation.   Electronically Signed   By: Conchita Paris M.D.   On: 01/16/2015 12:46    Assessment/Plan  Physical deconditioning Will have him work with physical therapy and occupational therapy team to help with gait training and muscle strengthening exercises.fall precautions. Skin care. Encourage to be out of bed.   HCAP Improved breathing, afebrile. Completed his ceftin course. Monitor clinically  Dysphagia With high aspiration risk. Continue dysphagia diet, SLP to follow, aspiration  precautions  Protein calorie malnutrition Encourage po intake, continue protein supplement and monitor weight  Pressure ulcer With SDTI as well. With protein calorie malnutrition this can worsen. Continue skin  care, pressure ulcer prophylaxis with repositioning and use of air mattress.   Hypokalemia Continue kcl 20 meq daily and check bmp with mg  HTN On amlodipine 5 mg daily at home and hospital,unclear why not receiving it at facility. Resume amlodipine 5 mg daily for now with holding parameters and check bp q shift x 3 days, then daily for 2 weeks and then per facility protocol  Fall initial encounter Had fall at home and one in facility today. His dementia increases his fall risk along with his deconditioning. Fall precautions in place  Dementia Monitor clinically, to provide assistance with ADLs as needed, fall precautions and pressure ulcer prophylaxis    Goals of care: short term rehabilitation but possible long term care with his dementia   Labs/tests ordered: cbc, bmp, mg  Family/ staff Communication: reviewed care plan with patient and nursing supervisor    Blanchie Serve, MD  [facility] [phone] (Monday-Friday 8 am - 5 pm) [phone] (afterhours)

## 2015-02-20 ENCOUNTER — Non-Acute Institutional Stay: Payer: Medicare PPO | Admitting: Adult Health

## 2015-02-20 ENCOUNTER — Encounter: Payer: Self-pay | Admitting: Adult Health

## 2015-02-20 DIAGNOSIS — G309 Alzheimer's disease, unspecified: Secondary | ICD-10-CM | POA: Diagnosis not present

## 2015-02-20 DIAGNOSIS — I10 Essential (primary) hypertension: Secondary | ICD-10-CM

## 2015-02-20 DIAGNOSIS — R531 Weakness: Secondary | ICD-10-CM | POA: Diagnosis not present

## 2015-02-20 DIAGNOSIS — F0280 Dementia in other diseases classified elsewhere without behavioral disturbance: Secondary | ICD-10-CM | POA: Diagnosis not present

## 2015-02-20 DIAGNOSIS — R1310 Dysphagia, unspecified: Secondary | ICD-10-CM

## 2015-02-20 DIAGNOSIS — E876 Hypokalemia: Secondary | ICD-10-CM | POA: Diagnosis not present

## 2015-02-20 DIAGNOSIS — E43 Unspecified severe protein-calorie malnutrition: Secondary | ICD-10-CM | POA: Diagnosis not present

## 2015-02-20 NOTE — Progress Notes (Addendum)
Patient ID: Jimmy Solis, male   DOB: [birth year], 81 y.o.   MRN: [id number]    DATE:  02/20/15  MRN:  [id number]  BIRTHDAY: November 14, 1928  Facility:  [facility]ursing Home Location:  [place] Room Number: 694-8  LEVEL OF CARE:  SNF (31)  Contact Information    Name Relation Home Work [place] Friend   [phone]       Chief Complaint  Patient presents with  . Discharge Note    Generalized weakness, Alzheimer's dementia, dysphagia, hypokalemia, hypertension and protein calorie malnutrition    HISTORY OF PRESENT ILLNESS:  This is an 81 year old male who is for discharge home with home health PT for endurance, OT for ADLs, speech therapy for cognition and CNA for showers. DME:  3 in 1 bedside commode, standard wheelchair with leg rests, cushion and anti-tippers. He has been admitted to [facility] on 01/23/15 from [facility]. He has PMH of Alzheimer's disease, prostate cancer and hypertension. He lives alone. His POA found him down at home in the bathroom for unknown length of time. He was found to have rhabdomyolysis and sepsis secondary to HCAP versus aspiration pneumonia. He was given IV fluids and vancomycin/aztreonam. He improved. He was discharged with Ceftin X 2 more days.  Patient was admitted to this facility for short-term rehabilitation after the patient's recent hospitalization.  Patient has completed SNF rehabilitation and therapy has cleared the patient for discharge.  PAST MEDICAL HISTORY:  Past Medical History  Diagnosis Date  . Retinal detachment     left  . 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 Alzheimers disease   . Stroke [facility]) ~ 12/2014    "lost vision in right eye"  . Blind right eye   . Depression     hx  . Prostate cancer ([place])   . Cancer of skin of back   . Falls frequently     "probably once/month" (01/16/2015)  . TIA (transient ischemic attack)     "several over the last couple years;  brain scans have reflected that; sees /Dr. Jannifer Franklin" (01/16/2015)     CURRENT MEDICATIONS: Reviewed  Patient's Medications  New Prescriptions   No medications on file  Previous Medications   AMLODIPINE (NORVASC) 5 MG TABLET    Take 1 tablet (5 mg total) by mouth daily.   ASPIRIN 81 MG TABLET    Take 81 mg by mouth daily.   FEEDING SUPPLEMENT, ENSURE ENLIVE, (ENSURE ENLIVE) LIQD    Take 237 mLs by mouth 2 (two) times daily between meals.   POTASSIUM CHLORIDE 20 MEQ/15ML (10%) SOLN    Take 15 mLs (20 mEq total) by mouth daily.  Modified Medications   No medications on file  Discontinued Medications   CEFUROXIME (CEFTIN) 250 MG TABLET    Take 1 tablet (250 mg total) by mouth 2 (two) times daily with a meal.     Allergies  Allergen Reactions  . Penicillins Hives  . Ativan [Lorazepam] Other (See Comments)    Hallucination, did calm patient down but he became confused / delirious after getting this (delerium may have already been going on before ativan was given, and ativan may have actually converted him from agitated delirium to non-agitated delirium)     REVIEW OF SYSTEMS:  GENERAL: no change in appetite, no fatigue, no weight changes, no fever, chills or weakness EYES: Denies change in vision, dry eyes, eye pain, itching or discharge EARS: Denies change  in hearing, ringing in ears, or earache NOSE: Denies nasal congestion or epistaxis MOUTH and THROAT: Denies oral discomfort, gingival pain or bleeding, pain from teeth or hoarseness   RESPIRATORY: no cough, SOB, DOE, wheezing, hemoptysis CARDIAC: no chest pain, edema or palpitations GI: no abdominal pain, diarrhea, constipation, heart burn, nausea or vomiting GU: Denies dysuria, frequency, hematuria, incontinence, or discharge PSYCHIATRIC: Denies feeling of depression or anxiety. No report of hallucinations, insomnia, paranoia, or agitation   PHYSICAL EXAMINATION  GENERAL APPEARANCE:  In no acute distress.  SKIN:  Skin is intact  and no redness HEAD: Normal in size and contour. No evidence of trauma EYES: Lids open and close normally. No blepharitis, entropion or ectropion. PERRL. Conjunctivae are clear and sclerae are white. Lenses are without opacity EARS: Pinnae are normal. Patient hears normal voice tunes of the examiner MOUTH and THROAT: Lips are without lesions. Oral mucosa is moist and without lesions. Tongue is normal in shape, size, and color and without lesions NECK: supple, trachea midline, no neck masses, no thyroid tenderness, no thyromegaly LYMPHATICS: no LAN in the neck, no supraclavicular LAN RESPIRATORY: breathing is even & unlabored, BS CTAB CARDIAC: RRR, no murmur,no extra heart sounds, no edema GI: abdomen soft, normal BS, no masses, no tenderness, no hepatomegaly, no splenomegaly EXTREMITIES:  Able to move 4 extremities PSYCHIATRIC: Alert to self, disoriented to time/place. Affect and behavior are appropriate  LABS/RADIOLOGY: Labs reviewed: 02/10/15  sodium 139 potassium 3.8 glucose 82 BUN 15 creatinine 0.81 calcium 8.9 albumin 3.38+4 was 3.1 magnesium 1.9 Basic Metabolic Panel:  Recent Labs  01/19/15 0253  01/21/15 0235  01/22/15 1606 01/23/15 0731 01/23/15 1709  NA 141  < > 137  < > 142 143 139  K 3.4*  < > 3.7  < > 2.9* 3.0* 3.9  CL 113*  < > 107  < > 107 107 104  CO2 19*  < > 23  < > 27 29 27   GLUCOSE 92  < > 92  < > 138* 119* 156*  BUN 16  < > 13  < > 18 11 13   CREATININE 0.98  < > 0.79  < > 0.96 0.71 0.86  CALCIUM 8.2*  < > 8.3*  < > 8.4* 8.5* 8.6*  MG 1.8  --  1.9  --   --  1.6*  --   < > = values in this interval not displayed. Liver Function Tests:  Recent Labs  01/21/15 0235 01/22/15 0248  AST 44* 39  ALT 50 49  ALKPHOS 60 73  BILITOT 0.9 0.8  PROT 5.6* 6.3*  ALBUMIN 2.4* 2.6*   CBC:  Recent Labs  01/16/15 1250  01/21/15 0235 01/22/15 0248 01/23/15 0731  WBC 12.1*  < > 8.7 7.3 5.5  NEUTROABS 9.9*  --   --   --   --   HGB 15.2  < > 11.3* 12.0* 11.7*    HCT 44.3  < > 33.4* 34.4* 34.5*  MCV 98.7  < > 99.7 98.6 99.1  PLT 221  < > 167 191 202  < > = values in this interval not displayed.  Cardiac Enzymes:  Recent Labs  01/17/15 0502 01/17/15 1150 01/17/15 1842 01/17/15 2223 01/18/15 0430 01/20/15 0306  CKTOTAL 71696*  --   --   --  3626* 949*  TROPONINI  --  0.07* 0.06* 0.05*  --   --    CBG:  Recent Labs  01/23/15 0820 01/23/15 1230 01/23/15 1729  GLUCAP 113*  104* 147*     ASSESSMENT/PLAN:  Generalized weakness - for home health PT, OT, ST and CNA  HCAP - resolved  Alzheimer's dementia - CT head negative for acute intracranial abnormality; continue supportive measures  Dysphagia - upgraded diet to mechanical soft solids with ground meats and nectar thick liquids via straw ; keep H OB elevated over 30  Hypokalemia - K3.8; continue KCl 10 meq/15 mL by mouth daily  Hypertension - well controlled; continue amlodipine 5 mg 1 tab by mouth daily  Protein calorie malnutrition, severe - albumin 3.38; continue supplementation      I have filled out patient's discharge paperwork and written prescriptions.  Patient will receive home health PT, OT, ST, and CNA.  DME provided:  3 in 1 bedside commode, standard wheelchair with leg rests, cushion and anti-tippers  Total discharge time: Greater than 30 minutes  Discharge time involved coordination of the discharge process with social worker, nursing staff and therapy department. Medical justification for home health services/DME verified.    [facility], NP [employer] [phone]

## 2015-02-20 NOTE — Progress Notes (Signed)
Patient ID: Jimmy Solis, male   DOB: 02-May-1929, 81 y.o.   MRN: [id number]    DATE:  01/26/15  MRN:  [id number]  BIRTHDAY: November 20, 1928  Facility:  Nursing Home Location:  [place] Room Number: 929-2  LEVEL OF CARE:  SNF (31)  Contact Information    Name Relation Home Work [place] Friend   [phone]       Chief Complaint  Patient presents with  . Hospitalization Follow-up    Generalized weakness, HCAP, memory deficits, dysphagia, fall, hypokalemia, hypertension and protein calorie malnutrition    HISTORY OF PRESENT ILLNESS:  This is an 81 year old male who has been admitted to [facility] on 01/23/15 from [facility]. He has PMH of Alzheimer's disease, prostate cancer and hypertension. He lives alone. His POA found him down at home in the bathroom for unknown length of time. He was found to have rhabdo myololysis and sepsis is secondary to HCAP versus aspiration pneumonia. He was given IV fluids and vancomycin/aztreonam. He improved. He was discharged with Ceftin X 2 more days.  He has been admitted for a short-term rehabilitation.  PAST MEDICAL HISTORY:  Past Medical History  Diagnosis Date  . Retinal detachment     left  . 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 Alzheimers disease   . Stroke ~ 12/2014    "lost vision in right eye"  . Blind right eye   . Depression     hx  . Prostate cancer   . Cancer of skin of back   . Falls frequently     "probably once/month" (01/16/2015)  . TIA (transient ischemic attack)     "several over the last couple years; brain scans have reflected that; sees /Dr. Jannifer Franklin" (01/16/2015)     CURRENT MEDICATIONS: Reviewed  Patient's Medications  New Prescriptions   No medications on file  Previous Medications   AMLODIPINE (NORVASC) 5 MG TABLET    Take 1 tablet (5 mg total) by mouth daily.   ASPIRIN 81 MG TABLET    Take 81 mg by mouth daily.   CEFUROXIME (CEFTIN) 250 MG  TABLET    Take 1 tablet (250 mg total) by mouth 2 (two) times daily with a meal.   FEEDING SUPPLEMENT, ENSURE ENLIVE, (ENSURE ENLIVE) LIQD    Take 237 mLs by mouth 2 (two) times daily between meals.   POTASSIUM CHLORIDE 20 MEQ/15ML (10%) SOLN    Take 15 mLs (20 mEq total) by mouth daily.  Modified Medications   No medications on file  Discontinued Medications   No medications on file     Allergies  Allergen Reactions  . Penicillins Hives  . Ativan [Lorazepam] Other (See Comments)    Hallucination, did calm patient down but he became confused / delirious after getting this (delerium may have already been going on before ativan was given, and ativan may have actually converted him from agitated delirium to non-agitated delirium)     REVIEW OF SYSTEMS:  GENERAL: no change in appetite, no fatigue, no weight changes, no fever, chills or weakness EYES: Denies change in vision, dry eyes, eye pain, itching or discharge EARS: Denies change in hearing, ringing in ears, or earache NOSE: Denies nasal congestion or epistaxis MOUTH and THROAT: Denies oral discomfort, gingival pain or bleeding, pain from teeth or hoarseness   RESPIRATORY: no cough, SOB, DOE, wheezing, hemoptysis CARDIAC: no chest pain, edema or palpitations GI: no abdominal pain, diarrhea,  constipation, heart burn, nausea or vomiting GU: Denies dysuria, frequency, hematuria, incontinence, or discharge PSYCHIATRIC: Denies feeling of depression or anxiety. No report of hallucinations, insomnia, paranoia, or agitation   PHYSICAL EXAMINATION  GENERAL APPEARANCE:  In no acute distress.  SKIN:  Redness on sacral area HEAD: Normal in size and contour. No evidence of trauma EYES: Lids open and close normally. No blepharitis, entropion or ectropion. PERRL. Conjunctivae are clear and sclerae are white. Lenses are without opacity EARS: Pinnae are normal. Patient hears normal voice tunes of the examiner MOUTH and THROAT: Lips are without  lesions. Oral mucosa is moist and without lesions. Tongue is normal in shape, size, and color and without lesions NECK: supple, trachea midline, no neck masses, no thyroid tenderness, no thyromegaly LYMPHATICS: no LAN in the neck, no supraclavicular LAN RESPIRATORY: breathing is even & unlabored, BS CTAB CARDIAC: RRR, no murmur,no extra heart sounds, no edema GI: abdomen soft, normal BS, no masses, no tenderness, no hepatomegaly, no splenomegaly EXTREMITIES:  Able to move 4 extremities PSYCHIATRIC: Alert to self, disoriented to time/place. Affect and behavior are appropriate  LABS/RADIOLOGY: Labs reviewed: Basic Metabolic Panel:  Recent Labs  01/19/15 0253  01/21/15 0235  01/22/15 1606 01/23/15 0731 01/23/15 1709  NA 141  < > 137  < > 142 143 139  K 3.4*  < > 3.7  < > 2.9* 3.0* 3.9  CL 113*  < > 107  < > 107 107 104  CO2 19*  < > 23  < > $R'27 29 27  'bK$ GLUCOSE 92  < > 92  < > 138* 119* 156*  BUN 16  < > 13  < > $R'18 11 13  'rt$ CREATININE 0.98  < > 0.79  < > 0.96 0.71 0.86  CALCIUM 8.2*  < > 8.3*  < > 8.4* 8.5* 8.6*  MG 1.8  --  1.9  --   --  1.6*  --   < > = values in this interval not displayed. Liver Function Tests:  Recent Labs  01/21/15 0235 01/22/15 0248  AST 44* 39  ALT 50 49  ALKPHOS 60 73  BILITOT 0.9 0.8  PROT 5.6* 6.3*  ALBUMIN 2.4* 2.6*   CBC:  Recent Labs  01/16/15 1250  01/21/15 0235 01/22/15 0248 01/23/15 0731  WBC 12.1*  < > 8.7 7.3 5.5  NEUTROABS 9.9*  --   --   --   --   HGB 15.2  < > 11.3* 12.0* 11.7*  HCT 44.3  < > 33.4* 34.4* 34.5*  MCV 98.7  < > 99.7 98.6 99.1  PLT 221  < > 167 191 202  < > = values in this interval not displayed.  Cardiac Enzymes:  Recent Labs  01/17/15 0502 01/17/15 1150 01/17/15 1842 01/17/15 2223 01/18/15 0430 01/20/15 0306  CKTOTAL 24268*  --   --   --  3626* 949*  TROPONINI  --  0.07* 0.06* 0.05*  --   --    CBG:  Recent Labs  01/23/15 0820 01/23/15 1230 01/23/15 1729  GLUCAP 113* 104* 147*      ASSESSMENT/PLAN:  Generalized weakness - for rehabilitation  HCAP - continue Ceftin 2 more days  Memory deficits - CT head negative for acute intracranial abnormality; continue supportive measures  Dysphagia - continue pure with pudding thick liquids; keep H OB elevated over 30  Fall - for rehabilitation  Hypokalemia - K3.9; continue KCl 10 TMB every/15 mL by mouth daily; like be from poor  by mouth intake  Hypertension - well controlled; recently added amlodipine 5 mg 1 tab by mouth daily  Protein calorie malnutrition, severe - albumin 2.6; RD consult; check CBC and BMP   Goals of care:  Short-term rehabilitation   [facility], NP [place]

## 2015-02-24 DIAGNOSIS — R1310 Dysphagia, unspecified: Secondary | ICD-10-CM | POA: Diagnosis not present

## 2015-02-24 DIAGNOSIS — R270 Ataxia, unspecified: Secondary | ICD-10-CM | POA: Diagnosis not present

## 2015-02-24 DIAGNOSIS — R4182 Altered mental status, unspecified: Secondary | ICD-10-CM | POA: Diagnosis not present

## 2015-02-24 DIAGNOSIS — M6282 Rhabdomyolysis: Secondary | ICD-10-CM | POA: Diagnosis not present

## 2015-03-02 ENCOUNTER — Telehealth: Payer: Self-pay | Admitting: Neurology

## 2015-03-02 NOTE — Telephone Encounter (Signed)
I called Rico Ala, left a message, I will call back.

## 2015-03-02 NOTE — Telephone Encounter (Signed)
I called Jimmy Solis and left a voicemail asking him to call me back.

## 2015-03-02 NOTE — Telephone Encounter (Signed)
I spoke to [place]. He stated that the patient fell recently and has moved in with [facility]. He stated that in the late afternoon/early evening the patient gets more confused, anxious and agitated. He gets really worried and sometimes cries. Elta Guadeloupe thought it might be helpful for the patient to have an antianxiety medication on hand. I advised I would ask Dr. Jannifer Franklin about this and call him back.

## 2015-03-02 NOTE — Telephone Encounter (Signed)
Jimmy Solis, close friend [phone] called to request that Dr. Jannifer Franklin call in anxiety medication for patient prior to appointment 04/10/15. Patient now lives with Elta Guadeloupe (fell and was in NH 4-5 weeks) and is having a lot of anxiety issues, especially toward evening time.

## 2015-03-03 MED ORDER — ESCITALOPRAM OXALATE 5 MG PO TABS: 5.0000 mg | ORAL_TABLET | Freq: Every day | ORAL | Status: DC

## 2015-03-03 NOTE — Telephone Encounter (Signed)
I called the POA. The patient has had some issues with mild anxiety, no overt panic attacks, some crying spells, possibly some mild depression. I will add a very low dose of Lexapro, 5 mg. We will reassess the patient in December 2016.

## 2015-03-18 ENCOUNTER — Telehealth: Payer: Self-pay | Admitting: Neurology

## 2015-03-18 NOTE — Telephone Encounter (Signed)
Juliann Pulse with [facility] is calling to get a verbal order for the patient to have a walker. Thank you.

## 2015-03-18 NOTE — Telephone Encounter (Signed)
I called Jimmy Solis with [facility], okay for the patient did get a walker.

## 2015-04-06 ENCOUNTER — Encounter (INDEPENDENT_AMBULATORY_CARE_PROVIDER_SITE_OTHER): Payer: Medicare PPO | Admitting: Ophthalmology

## 2015-04-06 DIAGNOSIS — H47211 Primary optic atrophy, right eye: Secondary | ICD-10-CM | POA: Diagnosis not present

## 2015-04-06 DIAGNOSIS — H3411 Central retinal artery occlusion, right eye: Secondary | ICD-10-CM | POA: Diagnosis not present

## 2015-04-06 DIAGNOSIS — I10 Essential (primary) hypertension: Secondary | ICD-10-CM | POA: Diagnosis not present

## 2015-04-06 DIAGNOSIS — H43813 Vitreous degeneration, bilateral: Secondary | ICD-10-CM | POA: Diagnosis not present

## 2015-04-06 DIAGNOSIS — H353132 Nonexudative age-related macular degeneration, bilateral, intermediate dry stage: Secondary | ICD-10-CM

## 2015-04-06 DIAGNOSIS — H35033 Hypertensive retinopathy, bilateral: Secondary | ICD-10-CM

## 2015-04-07 ENCOUNTER — Ambulatory Visit (INDEPENDENT_AMBULATORY_CARE_PROVIDER_SITE_OTHER): Payer: Medicare PPO | Admitting: Neurology

## 2015-04-07 ENCOUNTER — Encounter: Payer: Self-pay | Admitting: Neurology

## 2015-04-07 ENCOUNTER — Encounter (INDEPENDENT_AMBULATORY_CARE_PROVIDER_SITE_OTHER): Payer: Self-pay

## 2015-04-07 VITALS — BP 148/82 | Ht 60.0 in | Wt 113.0 lb

## 2015-04-07 DIAGNOSIS — R269 Unspecified abnormalities of gait and mobility: Secondary | ICD-10-CM

## 2015-04-07 DIAGNOSIS — R413 Other amnesia: Secondary | ICD-10-CM

## 2015-04-07 DIAGNOSIS — F0150 Vascular dementia without behavioral disturbance: Secondary | ICD-10-CM | POA: Insufficient documentation

## 2015-04-07 HISTORY — DX: Unspecified abnormalities of gait and mobility: R26.9

## 2015-04-07 MED ORDER — DONEPEZIL HCL 5 MG PO TABS: 5.0000 mg | ORAL_TABLET | Freq: Every day | ORAL | Status: DC

## 2015-04-07 NOTE — Patient Instructions (Signed)
   We will start aricept for memory.  Begin Aricept (donepezil) at 5 mg at night for one month. If this medication is well-tolerated, please call our office and we will call in a prescription for the 10 mg tablets. Look out for side effects that may include nausea, diarrhea, weight loss, or stomach cramps. This medication will also cause a runny nose, therefore there is no need for allergy medications for this purpose.  

## 2015-04-07 NOTE — Progress Notes (Signed)
Reason for visit: Memory disorder  Jimmy Solis is an 81 y.o. male  History of present illness:  Jimmy Solis is an 81 year old right-handed white male with a history of a right central retinal artery occlusion and a history of vascular dementia. The patient has an associated memory disorder, and a gait disorder. He does have urinary incontinence, but he has had a radical prostatectomy in the past. The patient has been in the emergency room on 23 January 2015 with a fall. The patient is using a walker, but he does not always remember to use it. The has had some ongoing progression of memory. MRI of the brain showed extensive white matter changes that affects the periventricular white matter as well as the brainstem. The patient has not had any change in his vision since last seen. He returned to this office for an evaluation. He is on blood pressure medications and low-dose aspirin.  Past Medical History  Diagnosis Date  . Retinal detachment     left  . 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 Alzheimers disease   . Stroke [facility]) ~ 12/2014    "lost vision in right eye"  . Blind right eye   . Depression     hx  . Prostate cancer ([place])   . Cancer of skin of back   . Falls frequently     "probably once/month" (01/16/2015)  . TIA (transient ischemic attack)     "several over the last couple years; brain scans have reflected that; sees /Dr. Jannifer Franklin" (01/16/2015)  . Abnormality of gait 04/07/2015    Past Surgical History  Procedure Laterality Date  . Prostatectomy  ~ 2001  . Cataract extraction w/ intraocular lens  implant, bilateral Bilateral ~ 2004  . Retinal detachment surgery Left ~ 2006  . Tonsillectomy    . Eye surgery    . Hernia repair    . Abdominal hernia repair  ~ 2003  . Skin cancer excision  ~ 2013    "back"    Family History  Problem Relation Age of Onset  . Stroke Mother   . Lung cancer Brother   . Tuberculosis Father     Social history:  reports  that he has never smoked. He has never used smokeless tobacco. He reports that he does not drink alcohol or use illicit drugs.    Allergies  Allergen Reactions  . Penicillins Hives  . Ativan [Lorazepam] Other (See Comments)    Hallucination, did calm patient down but he became confused / delirious after getting this (delerium may have already been going on before ativan was given, and ativan may have actually converted him from agitated delirium to non-agitated delirium)    Medications:  Prior to Admission medications   Medication Sig Start Date End Date Taking? Authorizing Provider  amLODipine (NORVASC) 5 MG tablet Take 1 tablet (5 mg total) by mouth daily. 01/23/15  Yes Donne Hazel, MD  aspirin 81 MG tablet Take 81 mg by mouth daily.   Yes Historical Provider, MD  escitalopram (LEXAPRO) 5 MG tablet Take 1 tablet (5 mg total) by mouth daily. 03/03/15  Yes Kathrynn Ducking, MD  feeding supplement, ENSURE ENLIVE, (ENSURE ENLIVE) LIQD Take 237 mLs by mouth 2 (two) times daily between meals. 01/23/15  Yes Donne Hazel, MD  potassium chloride 20 MEQ/15ML (10%) SOLN Take 15 mLs (20 mEq total) by mouth daily. 01/23/15  Yes Donne Hazel, MD  traZODone (DESYREL) 50 MG tablet  Take 50 mg by mouth at bedtime.   Yes Historical Provider, MD    ROS:  Out of a complete 14 system review of symptoms, the patient complains only of the following symptoms, and all other reviewed systems are negative.  Runny nose Loss of vision Incontinence of bladder Walking difficulty Memory loss Confusion, anxiety  Blood pressure 148/82, height 5' (1.524 m), weight 113 lb (51.256 kg).  Physical Exam  General: The patient is alert and cooperative at the time of the examination.  Skin: No significant peripheral edema is noted.   Neurologic Exam  Mental status: The patient is alert and oriented x 2 at the time of the examination ( not oriented to date). The Mini-Mental Status examination done today shows a  total score of 17/30. The patient is able to name 7 animals in 30 seconds.   Cranial nerves: Facial symmetry is present. Speech is normal, no aphasia or dysarthria is noted. Extraocular movements are full with the left eye, the patient has minimal vision on the right. Visual fields are full with the left eye.  Motor: The patient has good strength in all 4 extremities.  Sensory examination: Soft touch sensation is symmetric on the face, arms, and legs.  Coordination: The patient has good finger-nose-finger and heel-to-shin bilaterally. The patient has apraxia for use of the lower extremities.  Gait and station: The patient has difficulty arising from seated position. Once up, the patient has a wide-based, unsteady gait. He is able to and platelet reasonable stability using a walker. Tandem gait was not attempted.  Reflexes: Deep tendon reflexes are symmetric, but are somewhat brisk throughout.   MRI brain 12/18/2014:   IMPRESSION:  Abnormal MRI brain (without) demonstrating: 1. Moderate-severe periventricular and subcortical and pontine chronic small vessel ischemic disease.  2. Chronic cerebral microhemorrhages in the left anterior temporal and left cerebellar regions. 3. Mild diffuse and severe mesial temporal atrophy. 4. No acute findings.  * MRI scan images were reviewed online. I agree with the written report.    Assessment/Plan:  One. Right central retinal artery occlusion  2. Vascular dementia  3. Gait disorder  The patient has extensive white matter changes MRI, likely a result from years of significant hypertension. The patient will remain on aspirin, and blood pressure medications. The patient will be placed on low-dose Aricept. He will followup in 6 months. The family is to contact me if he is not tolerating the medication.  Jill Alexanders MD 04/07/2015 8:44 PM  [facility] [address] [place], [place] [address]  Phone  [phone] Fax [phone]

## 2015-04-10 ENCOUNTER — Ambulatory Visit: Payer: Medicare PPO | Admitting: Neurology

## 2015-04-18 ENCOUNTER — Other Ambulatory Visit: Payer: Self-pay | Admitting: Adult Health

## 2015-04-30 ENCOUNTER — Other Ambulatory Visit: Payer: Self-pay | Admitting: Neurology

## 2015-05-05 ENCOUNTER — Telehealth: Payer: Self-pay | Admitting: Neurology

## 2015-05-05 ENCOUNTER — Other Ambulatory Visit: Payer: Self-pay | Admitting: Adult Health

## 2015-05-05 NOTE — Telephone Encounter (Signed)
Mark called, pt's POA sts he has been taking donepezil (ARICEPT) 5 MG tablet for almost a month with out any side effects. Dr Jannifer Franklin told him if the tolerated the medication he would like to increase to 10mg . He also said on last OV Dr Jannifer Franklin wanted pt to continue taking amLODipine (NORVASC) 5 MG tablet . He sts pt will be running out of that medication. Dr Jannifer Franklin wanted pt to continue taking medication due to strokes, PCP does not want pt to take medication. He is inquiring if [facility] will call in RX to [facility].

## 2015-05-05 NOTE — Telephone Encounter (Signed)
LVM informing Jimmy Solis that per Dr Leta Baptist, PCP needs to determine if patient needs amlodipine or if refills need to continue. Informed him Dr Jannifer Franklin returns to office on 05/11/14. Left this caller's name, number for further questions.

## 2015-05-05 NOTE — Telephone Encounter (Signed)
PCP should determine if pt needs amlodipine or if refills need to continue. -VRP

## 2015-05-15 MED ORDER — DONEPEZIL HCL 10 MG PO TABS: 10.0000 mg | ORAL_TABLET | Freq: Every day | ORAL | Status: DC

## 2015-05-15 NOTE — Addendum Note (Signed)
Addended by: Margette Fast on: 05/15/2015 08:47 AM   Modules accepted: Orders, Medications

## 2015-05-15 NOTE — Telephone Encounter (Signed)
I called Jimmy Solis. Not sure why the primary doctor did not want the amlodipine continued. I am okay with going up on the Aricept, I will call in the temporal grams tablets. I will try to call back later.

## 2015-05-15 NOTE — Telephone Encounter (Signed)
I called again. Mr. Jimmy Solis indicates that the primary MD did refill the Norvasc. I have called in the Aricept.

## 2015-06-01 ENCOUNTER — Other Ambulatory Visit: Payer: Self-pay | Admitting: Neurology

## 2015-09-28 ENCOUNTER — Encounter: Payer: Self-pay | Admitting: Adult Health

## 2015-09-28 ENCOUNTER — Ambulatory Visit (INDEPENDENT_AMBULATORY_CARE_PROVIDER_SITE_OTHER): Payer: Medicare Other | Admitting: Adult Health

## 2015-09-28 VITALS — BP 138/64 | HR 82 | Resp 20 | Ht 60.0 in | Wt 122.0 lb

## 2015-09-28 DIAGNOSIS — F0150 Vascular dementia without behavioral disturbance: Secondary | ICD-10-CM

## 2015-09-28 DIAGNOSIS — Z8669 Personal history of other diseases of the nervous system and sense organs: Secondary | ICD-10-CM

## 2015-09-28 MED ORDER — MEMANTINE HCL 28 X 5 MG & 21 X 10 MG PO TABS: ORAL_TABLET | ORAL | Status: DC

## 2015-09-28 NOTE — Progress Notes (Signed)
I have read the note, and I agree with the clinical assessment and plan.  WILLIS,CHARLES KEITH   

## 2015-09-28 NOTE — Progress Notes (Signed)
PATIENT: Jimmy Solis DOB: 01-15-1929  REASON FOR VISIT: follow up- Memory HISTORY FROM: patient  HISTORY OF PRESENT ILLNESS: Jimmy Solis is an 82 year old male with a history of a right central retinal artery occlusion and dementia. He returns today for follow-up. The patient is currently on Aricept and tolerating it well. He denies any significant changes. He lives with a family member. He is able to perform most ADLs with minimal assistance. He is not driving. He reports that he is sleeping well. He has a good appetite. Denies hallucinations or vivid dreams. Denies tremor. The patient continues on aspirin. He denies any new neurological symptoms. He returns today for an evaluation.  HISTORY 04/07/15 (WILLIS): Jimmy Solis is an 82 year old right-handed white male with a history of a right central retinal artery occlusion and a history of vascular dementia. The patient has an associated memory disorder, and a gait disorder. He does have urinary incontinence, but he has had a radical prostatectomy in the past. The patient has been in the emergency room on 23 January 2015 with a fall. The patient is using a walker, but he does not always remember to use it. The has had some ongoing progression of memory. MRI of the brain showed extensive white matter changes that affects the periventricular white matter as well as the brainstem. The patient has not had any change in his vision since last seen. He returned to this office for an evaluation. He is on blood pressure medications and low-dose aspirin.  REVIEW OF SYSTEMS: Out of a complete 14 system review of symptoms, the patient complains only of the following symptoms, and all other reviewed systems are negative.  Loss of vision, incontinence of bladder, memory loss  ALLERGIES: Allergies  Allergen Reactions  . Penicillins Hives  . Ativan [Lorazepam] Other (See Comments)    Hallucination, did calm patient down but he became confused / delirious  after getting this (delerium may have already been going on before ativan was given, and ativan may have actually converted him from agitated delirium to non-agitated delirium)    HOME MEDICATIONS: Outpatient Prescriptions Prior to Visit  Medication Sig Dispense Refill  . amLODipine (NORVASC) 5 MG tablet Take 1 tablet (5 mg total) by mouth daily. 30 tablet 0  . aspirin 81 MG tablet Take 81 mg by mouth daily.    Marland Kitchen donepezil (ARICEPT) 10 MG tablet Take 1 tablet (10 mg total) by mouth at bedtime. 30 tablet 5  . escitalopram (LEXAPRO) 5 MG tablet TAKE 1 TABLET (5 MG TOTAL) BY MOUTH DAILY. 30 tablet 6  . feeding supplement, ENSURE ENLIVE, (ENSURE ENLIVE) LIQD Take 237 mLs by mouth 2 (two) times daily between meals. 237 mL 0  . traZODone (DESYREL) 50 MG tablet Take 50 mg by mouth at bedtime.    . potassium chloride 20 MEQ/15ML (10%) SOLN Take 15 mLs (20 mEq total) by mouth daily. 450 mL 0   No facility-administered medications prior to visit.    PAST MEDICAL HISTORY: Past Medical History  Diagnosis Date  . Retinal detachment     left  . 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 Alzheimers disease   . Stroke [facility]) ~ 12/2014    "lost vision in right eye"  . Blind right eye   . Depression     hx  . Prostate cancer ([place])   . Cancer of skin of back   . Falls frequently     "probably once/month" (01/16/2015)  . TIA (transient  ischemic attack)     "several over the last couple years; brain scans have reflected that; sees /Dr. Jannifer Franklin" (01/16/2015)  . Abnormality of gait 04/07/2015    PAST SURGICAL HISTORY: Past Surgical History  Procedure Laterality Date  . Prostatectomy  ~ 2001  . Cataract extraction w/ intraocular lens  implant, bilateral Bilateral ~ 2004  . Retinal detachment surgery Left ~ 2006  . Tonsillectomy    . Eye surgery    . Hernia repair    . Abdominal hernia repair  ~ 2003  . Skin cancer excision  ~ 2013    "back"    FAMILY HISTORY: Family History  Problem  Relation Age of Onset  . Stroke Mother   . Lung cancer Brother   . Tuberculosis Father     SOCIAL HISTORY: Social History   Social History  . Marital Status: Single    Spouse Name: N/A  . Number of Children: 0  . Years of Education: Master's   Occupational History  . retired    Social History Main Topics  . Smoking status: Never Smoker   . Smokeless tobacco: Never Used  . Alcohol Use: No  . Drug Use: No  . Sexual Activity: Not on file   Other Topics Concern  . Not on file   Social History Narrative   Patient drinks 1 cup of caffeine daily.   Patient is right handed.         PHYSICAL EXAM  Filed Vitals:   09/28/15 0724  BP: 138/64  Pulse: 82  Resp: 20  Height: 5' (1.524 m)  Weight: 122 lb (55.339 kg)   Body mass index is 23.83 kg/(m^2). MMSE - Mini Mental State Exam 09/28/2015 04/07/2015  Orientation to time 0 0  Orientation to Place 0 1  Registration 3 3  Attention/ Calculation 4 5  Recall 0 0  Language- name 2 objects 2 2  Language- repeat 1 1  Language- follow 3 step command 3 3  Language- read & follow direction 1 1  Write a sentence 1 1  Copy design 0 0  Total score 15 17    Generalized: Well developed, in no acute distress   Neurological examination  Mentation: Alert. Follows all commands speech and language fluent Cranial nerve II-XII: Pupils were equal round reactive to light. Extraocular movements were full, visual field were full on confrontational test. Facial sensation and strength were normal. Uvula tongue midline. Head turning and shoulder shrug  were normal and symmetric. Motor: The motor testing reveals 5 over 5 strength of all 4 extremities. Good symmetric motor tone is noted throughout.  Sensory: Sensory testing is intact to soft touch on all 4 extremities. No evidence of extinction is noted.  Coordination: Cerebellar testing reveals good finger-nose-finger and heel-to-shin bilaterally.  Gait and station: Gait is normal. Tandem  gait is normal. Romberg is negative. No drift is seen.  Reflexes: Deep tendon reflexes are symmetric and normal bilaterally.   DIAGNOSTIC DATA (LABS, IMAGING, TESTING) - I reviewed patient records, labs, notes, testing and imaging myself where available.  Lab Results  Component Value Date   WBC 5.5 01/23/2015   HGB 11.7* 01/23/2015   HCT 34.5* 01/23/2015   MCV 99.1 01/23/2015   PLT 202 01/23/2015      Component Value Date/Time   NA 139 01/23/2015 1709   K 3.9 01/23/2015 1709   CL 104 01/23/2015 1709   CO2 27 01/23/2015 1709   GLUCOSE 156* 01/23/2015 1709   BUN 13  01/23/2015 1709   CREATININE 0.86 01/23/2015 1709   CALCIUM 8.6* 01/23/2015 1709   PROT 6.3* 01/22/2015 0248   ALBUMIN 2.6* 01/22/2015 0248   AST 39 01/22/2015 0248   ALT 49 01/22/2015 0248   ALKPHOS 73 01/22/2015 0248   BILITOT 0.8 01/22/2015 0248   GFRNONAA >60 01/23/2015 1709   GFRAA >60 01/23/2015 1709      ASSESSMENT AND PLAN 82 y.o. year old male  has a past medical history of Retinal detachment; CRAO (central retinal artery occlusion) (12/02/2014); Alzheimers disease; Stroke ([place]) (~ 12/2014); Blind right eye; Depression; Prostate cancer ([place]); Cancer of skin of back; Falls frequently; TIA (transient ischemic attack); and Abnormality of gait (04/07/2015). here with:  1. Memory disturbance 2. History of central retinal artery occulusion  The patient's memory score has slightly declined. His MMSE today is 15/30 was previously 17/30. The patient will continue on Aricept. He will start Namenda. I have reviewed Namenda and the potential side effects with the patient and his caregiver. The patient will continue on aspirin. He has been advised that if his symptoms worsen or he develops any new symptoms he should let us know. He will follow-up in 6 months or sooner if needed.       Ward Givens, MSN, NP-C 09/28/2015, 7:29 AM [facility] [address], [place], [place] [address] [phone]

## 2015-09-28 NOTE — Patient Instructions (Signed)
Continue Aricept  Begin namenda titration pack.  We will continue to monitor memory If your symptoms worsen or you develop new symptoms please let us know.   Memantine Tablets What is this medicine? MEMANTINE (MEM an teen) is used to treat dementia caused by Alzheimer's disease. This medicine may be used for other purposes; ask your health care provider or pharmacist if you have questions. What should I tell my health care provider before I take this medicine? They need to know if you have any of these conditions: -difficulty passing urine -kidney disease -liver disease -seizures -an unusual or allergic reaction to memantine, other medicines, foods, dyes, or preservatives -pregnant or trying to get pregnant -breast-feeding How should I use this medicine? Take this medicine by mouth with a glass of water. Follow the directions on the prescription label. You may take this medicine with or without food. Take your doses at regular intervals. Do not take your medicine more often than directed. Continue to take your medicine even if you feel better. Do not stop taking except on the advice of your doctor or health care professional. Talk to your pediatrician regarding the use of this medicine in children. Special care may be needed. Overdosage: If you think you have taken too much of this medicine contact a poison control center or emergency room at once. NOTE: This medicine is only for you. Do not share this medicine with others. What if I miss a dose? If you miss a dose, take it as soon as you can. If it is almost time for your next dose, take only that dose. Do not take double or extra doses. If you do not take your medicine for several days, contact your health care provider. Your dose may need to be changed. What may interact with this  medicine? -acetazolamide -amantadine -cimetidine -dextromethorphan -dofetilide -hydrochlorothiazide -ketamine -metformin -methazolamide -quinidine -ranitidine -sodium bicarbonate -triamterene This list may not describe all possible interactions. Give your health care provider a list of all the medicines, herbs, non-prescription drugs, or dietary supplements you use. Also tell them if you smoke, drink alcohol, or use illegal drugs. Some items may interact with your medicine. What should I watch for while using this medicine? Visit your doctor or health care professional for regular checks on your progress. Check with your doctor or health care professional if there is no improvement in your symptoms or if they get worse. You may get drowsy or dizzy. Do not drive, use machinery, or do anything that needs mental alertness until you know how this drug affects you. Do not stand or sit up quickly, especially if you are an older patient. This reduces the risk of dizzy or fainting spells. Alcohol can make you more drowsy and dizzy. Avoid alcoholic drinks. What side effects may I notice from receiving this medicine? Side effects that you should report to your doctor or health care professional as soon as possible: -allergic reactions like skin rash, itching or hives, swelling of the face, lips, or tongue -agitation or a feeling of restlessness -depressed mood -dizziness -hallucinations -redness, blistering, peeling or loosening of the skin, including inside the mouth -seizures -vomiting Side effects that usually do not require medical attention (report to your doctor or health care professional if they continue or are bothersome): -constipation -diarrhea -headache -nausea -trouble sleeping This list may not describe all possible side effects. Call your doctor for medical advice about side effects. You may report side effects to FDA at 1-800-FDA-1088. Where should I keep  my medicine? Keep  out of the reach of children. Store at room temperature between 15 degrees and 30 degrees C (59 degrees and 86 degrees F). Throw away any unused medicine after the expiration date. NOTE: This sheet is a summary. It may not cover all possible information. If you have questions about this medicine, talk to your doctor, pharmacist, or health care provider.    2016, Elsevier/Gold Standard. (2013-02-11 14:10:42)

## 2015-10-07 ENCOUNTER — Telehealth: Payer: Self-pay | Admitting: Adult Health

## 2015-10-07 NOTE — Telephone Encounter (Signed)
Patient's POA Elta Guadeloupe is calling regarding the patient. He says the patient has had a loss of appetite for the last 3 days since taking medication memantine (NAMENDA TITRATION PAK) tablet pack 8 days ago. Please call to discuss.

## 2015-10-07 NOTE — Telephone Encounter (Signed)
That is not a typical side effect of Namenda. However if they want to stop the medication to see if his appetite returns we can try that.

## 2015-10-07 NOTE — Telephone Encounter (Signed)
I called Elta Guadeloupe to get a little more detail but no answer. Left vm. What do you recommend?

## 2015-10-07 NOTE — Telephone Encounter (Signed)
I LM on VM the recommendation below. Let call back number for any further questions and to keep [place] updated.

## 2015-10-12 ENCOUNTER — Ambulatory Visit (INDEPENDENT_AMBULATORY_CARE_PROVIDER_SITE_OTHER): Payer: Medicare Other | Admitting: Ophthalmology

## 2015-10-12 DIAGNOSIS — H338 Other retinal detachments: Secondary | ICD-10-CM | POA: Diagnosis not present

## 2015-10-12 DIAGNOSIS — H353121 Nonexudative age-related macular degeneration, left eye, early dry stage: Secondary | ICD-10-CM | POA: Diagnosis not present

## 2015-10-12 DIAGNOSIS — H35033 Hypertensive retinopathy, bilateral: Secondary | ICD-10-CM

## 2015-10-12 DIAGNOSIS — H43813 Vitreous degeneration, bilateral: Secondary | ICD-10-CM

## 2015-10-12 DIAGNOSIS — H353112 Nonexudative age-related macular degeneration, right eye, intermediate dry stage: Secondary | ICD-10-CM

## 2015-10-12 DIAGNOSIS — H3411 Central retinal artery occlusion, right eye: Secondary | ICD-10-CM | POA: Diagnosis not present

## 2015-10-12 DIAGNOSIS — I10 Essential (primary) hypertension: Secondary | ICD-10-CM | POA: Diagnosis not present

## 2015-10-26 ENCOUNTER — Telehealth: Payer: Self-pay | Admitting: Adult Health

## 2015-10-26 MED ORDER — MEMANTINE HCL 10 MG PO TABS: 10.0000 mg | ORAL_TABLET | Freq: Two times a day (BID) | ORAL | Status: DC

## 2015-10-26 NOTE — Telephone Encounter (Signed)
Jimmy Solis called to request renewal/refill of MEMANTINE to [facility] in [place].

## 2015-10-26 NOTE — Telephone Encounter (Signed)
Done

## 2015-11-16 ENCOUNTER — Other Ambulatory Visit: Payer: Self-pay | Admitting: Neurology

## 2015-11-25 ENCOUNTER — Telehealth: Payer: Self-pay | Admitting: Adult Health

## 2015-11-25 MED ORDER — ESCITALOPRAM OXALATE 5 MG PO TABS: ORAL_TABLET | ORAL | Status: DC

## 2015-11-25 NOTE — Telephone Encounter (Signed)
Pt's caregiver Elta Guadeloupe called request refill for escitalopram (LEXAPRO) 5 MG tablet sent to [facility]. Pt is out of this medication. Pls call asap. [facility] has told [facility] were waiting for approval. He said the bottle has 1 refill until 04/29/16.

## 2015-12-28 ENCOUNTER — Telehealth: Payer: Self-pay | Admitting: Neurology

## 2015-12-28 DIAGNOSIS — I6521 Occlusion and stenosis of right carotid artery: Secondary | ICD-10-CM

## 2015-12-28 NOTE — Telephone Encounter (Signed)
[facility] Patient is ready to be scheduled for Doppler No Pa is needed . Thanks Hinton Dyer

## 2015-12-28 NOTE — Telephone Encounter (Signed)
I called the patient, talk with the power of attorney, Rico Ala. The patient had a carotid Doppler study done last year showing 50-69% stenosis of the right internal carotid artery, we will recheck the study in follow-up.

## 2016-01-06 ENCOUNTER — Ambulatory Visit (INDEPENDENT_AMBULATORY_CARE_PROVIDER_SITE_OTHER): Payer: Medicare Other

## 2016-01-06 DIAGNOSIS — I6521 Occlusion and stenosis of right carotid artery: Secondary | ICD-10-CM

## 2016-01-12 ENCOUNTER — Telehealth: Payer: Self-pay | Admitting: Neurology

## 2016-01-12 MED ORDER — ESCITALOPRAM OXALATE 10 MG PO TABS: 10.0000 mg | ORAL_TABLET | Freq: Every day | ORAL | 3 refills | Status: DC

## 2016-01-12 NOTE — Telephone Encounter (Signed)
If they feel that the behavior is constant and would like a medication adjustment then he can schedule a sooner appointment with me.

## 2016-01-12 NOTE — Telephone Encounter (Signed)
Late entry for any after-hours phone call on 01/09/2016 at 9:12 PM from [facility], caretaker for patient and power of attorney. I called back at the time. Caretaker reported that patient was more emotional and had some anger outbursts, worries about future, having to go to the nursing home, caretaker asked if he should give patient an extra Lexapro. I advised against it. I advised that I would place a note in the chart and notify Dr. Jannifer Franklin and his nurse. Maybe patient needs to be seen sooner for a follow-up appointment? I also told caller, that If there were any concerns for patient's or caretaker's safety, then Mr. Levada Dy should call 911. He said that there was no concern for violent behavior. He demonstrated understanding and agreement with the plan.

## 2016-01-12 NOTE — Telephone Encounter (Signed)
Pt last saw Megan NP in May for vascular dementia. He scored 15/30 on MMSE. He is on Aricept, Namenda and Lexapro. Does have a 6 month follow-up appt scheduled again w/ Megan NP in November.

## 2016-01-12 NOTE — Telephone Encounter (Signed)
Called pt's caregiver, Elta Guadeloupe, to offer sooner appt but no answer. Left VM mssg to call back if needed.

## 2016-01-12 NOTE — Telephone Encounter (Signed)
I called patient. The patient has had some increase in anxiety and agitation, okay to increase the Lexapro, I will call in a 10 mg tablet. They can use up the 5 mg tablets by taking 2 a day.

## 2016-01-12 NOTE — Addendum Note (Signed)
Addended by: Margette Fast on: 01/12/2016 05:45 PM   Modules accepted: Orders

## 2016-01-20 ENCOUNTER — Telehealth: Payer: Self-pay | Admitting: Neurology

## 2016-01-20 NOTE — Telephone Encounter (Signed)
I called the patient. The carotid doppler study is unchanged from last year, 76 to 69% stenosis on the right and left proximal internal carotid artery, and in the right mid internal carotid artery. We will continue to follow, recheck in one year.

## 2016-02-04 ENCOUNTER — Telehealth: Payer: Self-pay

## 2016-02-04 MED ORDER — ESCITALOPRAM OXALATE 10 MG PO TABS: 10.0000 mg | ORAL_TABLET | Freq: Every day | ORAL | 0 refills | Status: DC

## 2016-02-04 NOTE — Telephone Encounter (Signed)
90 day refill request

## 2016-03-23 ENCOUNTER — Telehealth: Payer: Self-pay | Admitting: Neurology

## 2016-03-23 NOTE — Telephone Encounter (Signed)
I called back, apparently the patient's caretaker Jimmy Solis has committed suicide. The patient has been placed with another caretaker, arrangements are going to be made to get into an extended care facility. Jimmy Solis will make sure that they haven't my information if they need anything to help with the management of Jimmy Solis.

## 2016-03-23 NOTE — Telephone Encounter (Signed)
Wendi Snipes- [address] Levada Dy fiance [phone] called to advise she found a suicide note from Elta Guadeloupe (authorities are involved) and she is currently taking care of the patient. She advised the patient is combative and he knows something is wrong, because he hasn't seen Mark in 24 hrs.  She called [facility] and was advised that over the past 2 weeks he has thrown his hand back as if to punch someone, short tempered, she advised he has been combative with her. She wants to know if there is a medication that he could take to help with this. She is trying to get him in a memory care unit as she cannot take care of him. Caryl Pina is very teary eyed and is aware there is not a DPR.

## 2016-03-24 ENCOUNTER — Emergency Department (HOSPITAL_COMMUNITY)
Admission: EM | Admit: 2016-03-24 | Discharge: 2016-03-26 | Disposition: A | Discharge status: Other Acute Inpatient Hospital | Payer: Medicare Other | Attending: Emergency Medicine | Admitting: Emergency Medicine

## 2016-03-24 ENCOUNTER — Encounter (HOSPITAL_COMMUNITY): Payer: Self-pay | Admitting: *Deleted

## 2016-03-24 DIAGNOSIS — F03918 Unspecified dementia, unspecified severity, with other behavioral disturbance: Secondary | ICD-10-CM | POA: Diagnosis present

## 2016-03-24 DIAGNOSIS — Z85828 Personal history of other malignant neoplasm of skin: Secondary | ICD-10-CM | POA: Insufficient documentation

## 2016-03-24 DIAGNOSIS — R45851 Suicidal ideations: Secondary | ICD-10-CM | POA: Insufficient documentation

## 2016-03-24 DIAGNOSIS — Z7982 Long term (current) use of aspirin: Secondary | ICD-10-CM | POA: Diagnosis not present

## 2016-03-24 DIAGNOSIS — Z8673 Personal history of transient ischemic attack (TIA), and cerebral infarction without residual deficits: Secondary | ICD-10-CM | POA: Insufficient documentation

## 2016-03-24 DIAGNOSIS — R410 Disorientation, unspecified: Secondary | ICD-10-CM | POA: Insufficient documentation

## 2016-03-24 DIAGNOSIS — Z8546 Personal history of malignant neoplasm of prostate: Secondary | ICD-10-CM | POA: Diagnosis not present

## 2016-03-24 DIAGNOSIS — Z0000 Encounter for general adult medical examination without abnormal findings: Secondary | ICD-10-CM

## 2016-03-24 DIAGNOSIS — Z046 Encounter for general psychiatric examination, requested by authority: Secondary | ICD-10-CM | POA: Diagnosis present

## 2016-03-24 DIAGNOSIS — Z79899 Other long term (current) drug therapy: Secondary | ICD-10-CM | POA: Diagnosis not present

## 2016-03-24 DIAGNOSIS — F0390 Unspecified dementia without behavioral disturbance: Secondary | ICD-10-CM | POA: Insufficient documentation

## 2016-03-24 DIAGNOSIS — F0391 Unspecified dementia with behavioral disturbance: Secondary | ICD-10-CM | POA: Diagnosis present

## 2016-03-24 LAB — RAPID URINE DRUG SCREEN, HOSP PERFORMED
Amphetamines: NOT DETECTED
BARBITURATES: NOT DETECTED
Benzodiazepines: NOT DETECTED
Cocaine: NOT DETECTED
Opiates: NOT DETECTED
Tetrahydrocannabinol: NOT DETECTED

## 2016-03-24 LAB — URINE MICROSCOPIC-ADD ON: Squamous Epithelial / LPF: NONE SEEN

## 2016-03-24 LAB — COMPREHENSIVE METABOLIC PANEL
ALBUMIN: 4.4 g/dL (ref 3.5–5.0)
ALK PHOS: 168 U/L — AB (ref 38–126)
ALT: 16 U/L — ABNORMAL LOW (ref 17–63)
ANION GAP: 10 (ref 5–15)
AST: 37 U/L (ref 15–41)
BILIRUBIN TOTAL: 0.7 mg/dL (ref 0.3–1.2)
BUN: 17 mg/dL (ref 6–20)
CALCIUM: 9.3 mg/dL (ref 8.9–10.3)
CO2: 26 mmol/L (ref 22–32)
Chloride: 98 mmol/L — ABNORMAL LOW (ref 101–111)
Creatinine, Ser: 1.07 mg/dL (ref 0.61–1.24)
GFR calc Af Amer: >60 mL/min (ref >60)
GFR calc non Af Amer: >60 mL/min (ref >60)
GLUCOSE: 99 mg/dL (ref 65–99)
Potassium: 3.7 mmol/L (ref 3.5–5.1)
Sodium: 134 mmol/L — ABNORMAL LOW (ref 135–145)
TOTAL PROTEIN: 8.1 g/dL (ref 6.5–8.1)

## 2016-03-24 LAB — URINALYSIS, ROUTINE W REFLEX MICROSCOPIC
Bilirubin Urine: NEGATIVE
GLUCOSE, UA: NEGATIVE mg/dL
Ketones, ur: NEGATIVE mg/dL
LEUKOCYTES UA: NEGATIVE
Nitrite: NEGATIVE
PH: 6 (ref 5.0–8.0)
PROTEIN: NEGATIVE mg/dL
SPECIFIC GRAVITY, URINE: 1.024 (ref 1.005–1.030)

## 2016-03-24 LAB — CBC
HEMATOCRIT: 41.4 % (ref 39.0–52.0)
Hemoglobin: 14.1 g/dL (ref 13.0–17.0)
MCH: 34.1 pg — AB (ref 26.0–34.0)
MCHC: 34.1 g/dL (ref 30.0–36.0)
MCV: 100.2 fL — AB (ref 78.0–100.0)
Platelets: 268 10*3/uL (ref 150–400)
RBC: 4.13 MIL/uL — ABNORMAL LOW (ref 4.22–5.81)
RDW: 14.7 % (ref 11.5–15.5)
WBC: 7.5 10*3/uL (ref 4.0–10.5)

## 2016-03-24 LAB — ACETAMINOPHEN LEVEL

## 2016-03-24 LAB — ETHANOL: Alcohol, Ethyl (B): <5 mg/dL (ref <5)

## 2016-03-24 LAB — SALICYLATE LEVEL: Salicylate Lvl: <7 mg/dL (ref 2.8–30.0)

## 2016-03-24 MED ORDER — ESCITALOPRAM OXALATE 10 MG PO TABS
10.0000 mg | ORAL_TABLET | Freq: Every day | ORAL | Status: DC
  Administered 2016-03-24: 10 mg via ORAL
  Filled 2016-03-24: qty 1

## 2016-03-24 MED ORDER — ASPIRIN EC 81 MG PO TBEC
81.0000 mg | DELAYED_RELEASE_TABLET | Freq: Every day | ORAL | Status: DC
  Administered 2016-03-26: 81 mg via ORAL
  Filled 2016-03-24 (×2): qty 1

## 2016-03-24 MED ORDER — AMLODIPINE BESYLATE 5 MG PO TABS
5.0000 mg | ORAL_TABLET | Freq: Every day | ORAL | Status: DC
  Administered 2016-03-24 – 2016-03-26 (×2): 5 mg via ORAL
  Filled 2016-03-24 (×2): qty 1

## 2016-03-24 MED ORDER — DONEPEZIL HCL 5 MG PO TABS
10.0000 mg | ORAL_TABLET | Freq: Every day | ORAL | Status: DC
  Administered 2016-03-24 – 2016-03-25 (×2): 10 mg via ORAL
  Filled 2016-03-24 (×3): qty 2

## 2016-03-24 MED ORDER — MEMANTINE HCL 10 MG PO TABS
10.0000 mg | ORAL_TABLET | Freq: Two times a day (BID) | ORAL | Status: DC
  Administered 2016-03-25 – 2016-03-26 (×2): 10 mg via ORAL
  Filled 2016-03-24 (×4): qty 1

## 2016-03-24 MED ORDER — TRAZODONE HCL 50 MG PO TABS
50.0000 mg | ORAL_TABLET | Freq: Every day | ORAL | Status: DC
  Administered 2016-03-24: 50 mg via ORAL
  Filled 2016-03-24 (×2): qty 1

## 2016-03-24 MED ORDER — ENSURE ENLIVE PO LIQD
237.0000 mL | Freq: Two times a day (BID) | ORAL | Status: DC
  Administered 2016-03-26: 237 mL via ORAL
  Filled 2016-03-24 (×4): qty 237

## 2016-03-24 MED ORDER — TRIAMCINOLONE ACETONIDE 0.1 % EX CREA
1.0000 "application " | TOPICAL_CREAM | Freq: Two times a day (BID) | CUTANEOUS | Status: DC
  Filled 2016-03-24 (×2): qty 15

## 2016-03-24 NOTE — Progress Notes (Signed)
CSW spoke with Wes with [employer], forensic. Patient currently has an open case and Crystal is following patient Veterinary surgeon [phone]). CSW filed an additional APS report regarding patients discharge plans and uncertainty of care giver. CSW attempted to contact Wendi Snipes (care givers fiance see notes for 11/15) at [phone], voicemail left for return call. According to notes from 11/15, Caryl Pina has found new caregiver (unsure who) and is attempting to find patient placement in a memory care facility. CSW will continue to update.   Kingsley Spittle, LCSWA Clinical Social Worker [phone]

## 2016-03-24 NOTE — ED Provider Notes (Signed)
[facility] DEPT Provider Note   CSN: [id number] Arrival date & time: 03/24/16  1632     History   Chief Complaint Chief Complaint  Patient presents with  . Psychiatric Evaluation    HPI Jimmy Solis is a 82 y.o. male.  The history is provided by the patient. No language interpreter was used.    Jimmy Solis is a 82 y.o. male who presents to the Emergency Department complaining of psychiatric evaluation.  Level V caveat due to dementia.    IVC papers state patient is a danger to himself and others.  The patient told the petitioner of IVC that he was going to kill himself and threatening group home staff that he would kill them. On patient's interview patient does not recall events.  Additional information provided states that patient's caregiver recently committed suicide and the patient is not yet aware.    Past Medical History:  Diagnosis Date  . Abnormality of gait 04/07/2015  . Alzheimers disease   . Blind right eye   . Cancer of skin of back   . 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 Depression    hx  . Falls frequently    "probably once/month" (01/16/2015)  . Prostate cancer ([place])   . Retinal detachment    left  . Stroke [facility]) ~ 12/2014   "lost vision in right eye"  . TIA (transient ischemic attack)    "several over the last couple years; brain scans have reflected that; sees /Dr. Jannifer Franklin" (01/16/2015)    Patient Active Problem List   Diagnosis Date Noted  . Vascular dementia 04/07/2015  . Abnormality of gait 04/07/2015  . Essential hypertension 02/20/2015  . Aspiration pneumonia ([place]) 01/23/2015  . Fall   . Dysphagia   . Pressure ulcer 01/17/2015  . Rhabdomyolysis 01/16/2015  . Delirium 01/16/2015  . 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 Memory deficits 12/02/2014    Past Surgical History:  Procedure Laterality Date  . ABDOMINAL HERNIA REPAIR  ~ 2003  . CATARACT EXTRACTION W/ INTRAOCULAR LENS  IMPLANT, BILATERAL  Bilateral ~ 2004  . EYE SURGERY    . HERNIA REPAIR    . PROSTATECTOMY  ~ 2001  . RETINAL DETACHMENT SURGERY Left ~ 2006  . SKIN CANCER EXCISION  ~ 2013   "back"  . TONSILLECTOMY         Home Medications    Prior to Admission medications   Medication Sig Start Date End Date Taking? Authorizing Provider  amLODipine (NORVASC) 5 MG tablet Take 1 tablet (5 mg total) by mouth daily. 01/23/15  Yes Donne Hazel, MD  aspirin EC 81 MG tablet Take 81 mg by mouth daily.   Yes Historical Provider, MD  donepezil (ARICEPT) 10 MG tablet Take 10 mg by mouth at bedtime.   Yes Historical Provider, MD  escitalopram (LEXAPRO) 10 MG tablet Take 1 tablet (10 mg total) by mouth daily. 02/04/16  Yes Kathrynn Ducking, MD  feeding supplement, ENSURE ENLIVE, (ENSURE ENLIVE) LIQD Take 237 mLs by mouth 2 (two) times daily between meals. 01/23/15  Yes Donne Hazel, MD  memantine (NAMENDA) 10 MG tablet Take 1 tablet (10 mg total) by mouth 2 (two) times daily. 10/26/15  Yes Ward Givens, NP  traZODone (DESYREL) 50 MG tablet Take 50 mg by mouth at bedtime.   Yes Historical Provider, MD  triamcinolone cream (KENALOG) 0.1 % Apply 1 application topically 2 (two) times daily.   Yes Historical Provider, MD  Family History Family History  Problem Relation Age of Onset  . Stroke Mother   . Lung cancer Brother   . Tuberculosis Father     Social History Social History  Substance Use Topics  . Smoking status: Never Smoker  . Smokeless tobacco: Never Used  . Alcohol use No     Allergies   Penicillins and Ativan [lorazepam]   Review of Systems Review of Systems  Unable to perform ROS: Dementia     Physical Exam Updated Vital Signs BP 160/78   Pulse 74   Temp 97.4 F (36.3 C) (Axillary)   Resp 18   SpO2 100%   Physical Exam  Constitutional: He appears well-developed and well-nourished.  HENT:  Head: Normocephalic and atraumatic.  Cardiovascular: Normal rate and regular rhythm.     Pulmonary/Chest: Effort normal. No respiratory distress.  Musculoskeletal: Normal range of motion.  Neurological: He is alert.  Confused, disoriented to place and time.   Skin: Skin is warm.  Psychiatric:  Pleasant, tangential  Nursing note and vitals reviewed.    ED Treatments / Results  Labs (all labs ordered are listed, but only abnormal results are displayed) Labs Reviewed  COMPREHENSIVE METABOLIC PANEL - Abnormal; Notable for the following:       Result Value   Sodium 134 (*)    Chloride 98 (*)    ALT 16 (*)    Alkaline Phosphatase 168 (*)    All other components within normal limits  CBC - Abnormal; Notable for the following:    RBC 4.13 (*)    MCV 100.2 (*)    MCH 34.1 (*)    All other components within normal limits  ETHANOL  SALICYLATE LEVEL  ACETAMINOPHEN LEVEL  RAPID URINE DRUG SCREEN, HOSP PERFORMED    EKG  EKG Interpretation None       Radiology No results found.  Procedures Procedures (including critical care time)  Medications Ordered in ED Medications - No data to display   Initial Impression / Assessment and Plan / ED Course  I have reviewed the triage vital signs and the nursing notes.  Pertinent labs & imaging results that were available during my care of the patient were reviewed by me and considered in my medical decision making (see chart for details).  Clinical Course     Patient here for psychiatric evaluation under IVC prior to ED arrival. He is calm and pleasantly demented in the emergency department. He has been medically cleared for psychiatric evaluation and treatment. Attempted to contact patient's power of attorney but no answer at number listed.    Final Clinical Impressions(s) / ED Diagnoses   Final diagnoses:  None    New Prescriptions New Prescriptions   No medications on file     Quintella Reichert, MD 03/25/16 [phone]

## 2016-03-24 NOTE — ED Notes (Signed)
Personal belongings including cell phone and clothes and medications locked in locker 29.

## 2016-03-24 NOTE — ED Notes (Signed)
[employer] left information for his POA who is Edwyna Shell- an attorney phone [phone]. Lattie Haw is his assistant- she is aware of the situation as well.  [place] [facility] [phone]

## 2016-03-24 NOTE — ED Triage Notes (Signed)
Per [employer], pt was brought in from an adult daycare type facility where they report that he made SI thoughts. Pt has a personal caregiver named Elta Guadeloupe that supposedly committed suicide yesterday, yet the pt does not know this. Pt has hx of alzheimer's and dementia.

## 2016-03-24 NOTE — ED Notes (Signed)
Unable to collect labs at this time. RN made aware.

## 2016-03-24 NOTE — Progress Notes (Signed)
CSW attempted to contact Crystal with Adult Protective Services at [phone] with no success. Voicemail left to return call. After hours APS was contacted. Awaiting return call. CSW will continue to update.  Kingsley Spittle, [facility] Clinical Social Worker [phone]

## 2016-03-25 ENCOUNTER — Emergency Department (HOSPITAL_COMMUNITY): Payer: Medicare Other

## 2016-03-25 DIAGNOSIS — F0391 Unspecified dementia with behavioral disturbance: Secondary | ICD-10-CM | POA: Diagnosis present

## 2016-03-25 DIAGNOSIS — F03918 Unspecified dementia, unspecified severity, with other behavioral disturbance: Secondary | ICD-10-CM | POA: Diagnosis present

## 2016-03-25 LAB — CBG MONITORING, ED: Glucose-Capillary: 86 mg/dL (ref 65–99)

## 2016-03-25 LAB — TSH: TSH: 2.681 u[IU]/mL (ref 0.350–4.500)

## 2016-03-25 MED ORDER — QUETIAPINE FUMARATE 25 MG PO TABS
25.0000 mg | ORAL_TABLET | Freq: Every day | ORAL | Status: DC
  Administered 2016-03-25: 25 mg via ORAL
  Filled 2016-03-25: qty 1

## 2016-03-25 MED ORDER — SERTRALINE HCL 50 MG PO TABS
25.0000 mg | ORAL_TABLET | Freq: Every day | ORAL | Status: DC
  Administered 2016-03-26: 25 mg via ORAL
  Filled 2016-03-25: qty 1

## 2016-03-25 NOTE — ED Notes (Signed)
Patient resting with eyes closed  with noted chest rise and fall.

## 2016-03-25 NOTE — BH Assessment (Addendum)
Assessment Note  Jimmy Solis is an 82 y.o. male that presents this date and is unable to be assessed by this writer due to patient's dementia. Patient is under IVC. The following information was gathered from admission notes: "Patient's caregiver committed suicide per notes. CSW noted Wendi Snipes [phone] (patient's caregiver finance') was contacted to gather information although there is no documentation of that conversation. CSW contacted Edwyna Shell [phone] POA) attorney to attempt to find information on patient. CSW per notes, is in the process of contacting  Adult Protective Services (ADS) regarding pt's discharge plans and uncertainty of his caregiver. CSW attempted to contact the pt's case worker Veterinary surgeon, [phone]) with ADS to no avail. Patient per notes, was at an adult daycare and he reported wanting to kill himself. CSW stated per notes, pt's caregiver committed suicide yesterday (03/23/16), but the pt does not know. CSW reported calling pt's caregiver's fiance' Wendi Snipes, [phone]) to no avail. CSW reported she is awaiting return call from [place]". CSW reported, per pt's chart  Pt's POA is an attorney name, Edwyna Shell [phone]. Patient currently has an open case and Crystal is following patient Veterinary surgeon [phone]). CSW filed an additional APS report regarding patients discharge plans and uncertainty of care giver. CSW attempted to contact Wendi Snipes (care givers fiance see notes for 11/15) at [phone], voicemail left for return call. According to notes from 11/15, Caryl Pina has found new caregiver (unsure who) and is attempting to find patient placement in a memory care facility. CSW will continue to update. Based on information obtained Lord DNP recommended a Gero-psych placement.    Diagnosis: Dementia (per notes)  Past Medical History:  Past Medical History:  Diagnosis Date  . Abnormality of gait 04/07/2015  . Alzheimers disease   .  Blind right eye   . Cancer of skin of back   . 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 Depression    hx  . Falls frequently    "probably once/month" (01/16/2015)  . Prostate cancer ([place])   . Retinal detachment    left  . Stroke [facility]) ~ 12/2014   "lost vision in right eye"  . TIA (transient ischemic attack)    "several over the last couple years; brain scans have reflected that; sees /Dr. Jannifer Franklin" (01/16/2015)    Past Surgical History:  Procedure Laterality Date  . ABDOMINAL HERNIA REPAIR  ~ 2003  . CATARACT EXTRACTION W/ INTRAOCULAR LENS  IMPLANT, BILATERAL Bilateral ~ 2004  . EYE SURGERY    . HERNIA REPAIR    . PROSTATECTOMY  ~ 2001  . RETINAL DETACHMENT SURGERY Left ~ 2006  . SKIN CANCER EXCISION  ~ 2013   "back"  . TONSILLECTOMY      Family History:  Family History  Problem Relation Age of Onset  . Stroke Mother   . Lung cancer Brother   . Tuberculosis Father     Social History:  reports that he has never smoked. He has never used smokeless tobacco. He reports that he does not drink alcohol or use drugs.  Additional Social History:  Alcohol / Drug Use Pain Medications: See MAR Prescriptions: See MAR Over the Counter: See MAR History of alcohol / drug use?: No history of alcohol / drug abuse  CIWA: CIWA-Ar BP: 108/57 Pulse Rate: (!) 57 COWS:    Allergies:  Allergies  Allergen Reactions  . Penicillins Hives and Other (See Comments)    Has patient had a PCN reaction causing immediate  rash, facial/tongue/throat swelling, SOB or lightheadedness with hypotension: Unsure Has patient had a PCN reaction causing severe rash involving mucus membranes or skin necrosis: Unsure Has patient had a PCN reaction that required hospitalization Unsure Has patient had a PCN reaction occurring within the last 10 years: Unsure If all of the above answers are "NO", then may proceed with Cephalosporin use.  . Ativan [Lorazepam] Other (See Comments)    Reaction:   Hallucinations     Home Medications:  (Not in a hospital admission)  OB/GYN Status:  No LMP for male patient.  General Assessment Data [facility] of Assessment: [facility] TTS Assessment: In system Is this a Tele or Face-to-Face Assessment?: Face-to-Face Is this an Initial Assessment or a Re-assessment for this encounter?: Initial Assessment Marital status: Widowed [place] name: na Is patient pregnant?: No Pregnancy Status: No Living Arrangements: Other (Comment) (UTA) Can pt return to current living arrangement?:  (UTA) Admission Status: Involuntary Is patient capable of signing voluntary admission?: No Referral Source: Self/Family/Friend Insurance type: Medicare  Medical Screening Exam ([place]) Medical Exam completed: Yes  Crisis Care Plan Living Arrangements: Other (Comment) (UTA) Legal Guardian:  (UTA) Name of Psychiatrist:  (UTA) Name of Therapist:  (UTA)  Education Status Is patient currently in school?: No Current Grade: na Highest grade of school patient has completed: na Name of school: na Contact person: na  Risk to self with the past 6 months Suicidal Ideation:  (UTA) Has patient been a risk to self within the past 6 months prior to admission? :  (UTA) Suicidal Intent:  (UTA) Has patient had any suicidal intent within the past 6 months prior to admission? :  (UTA) Is patient at risk for suicide?:  (UTA) Suicidal Plan?:  (UTA) Has patient had any suicidal plan within the past 6 months prior to admission? :  (UTA) Access to Means:  (UTA) What has been your use of drugs/alcohol within the last 12 months?:  (UTA) Previous Attempts/Gestures:  (UTA) How many times?:  (UTA) Other Self Harm Risks:  (UTA) Triggers for Past Attempts:  (UTA) Intentional Self Injurious Behavior:  (UTA) Family Suicide History:  (UTA) Recent stressful life event(s):  (UTA) Persecutory voices/beliefs?:  Pincus Badder) Depression:  (UTA) Depression Symptoms:  (UTA) Substance abuse  history and/or treatment for substance abuse?: No Suicide prevention information given to non-admitted patients:  (UTA)  Risk to Others within the past 6 months Homicidal Ideation:  (UTA) Does patient have any lifetime risk of violence toward others beyond the six months prior to admission? :  (UTA) Thoughts of Harm to Others:  (UTA) Current Homicidal Intent:  (UTA) Current Homicidal Plan:  (UTA) Access to Homicidal Means:  (UTA) Identified Victim:  (UTA) History of harm to others?:  (UTA) Assessment of Violence:  (UTA) Violent Behavior Description:  (UTA) Does patient have access to weapons?:  (UTA) Criminal Charges Pending?:  (UTA) Does patient have a court date:  (UTA) Is patient on probation?:  (UTA)  Psychosis Hallucinations:  (UTA) Delusions:  (UTA)  Mental Status Report Appearance/Hygiene: In scrubs Eye Contact: Poor Motor Activity: Unremarkable Speech: Unable to assess Level of Consciousness: Quiet/awake Mood:  (UTA) Affect: Unable to Assess Anxiety Level:  (UTA) Thought Processes: Unable to Assess Judgement: Unable to Assess Orientation: Not oriented Obsessive Compulsive Thoughts/Behaviors: Unable to Assess  Cognitive Functioning Concentration: Unable to Assess Memory: Unable to Assess IQ:  (UTA) Insight: Unable to Assess Impulse Control: Unable to Assess Appetite:  (UTA) Weight Loss:  (UTA) Weight Gain:  (UTA)  Sleep:  (UTA) Total Hours of Sleep:  (UTA) Vegetative Symptoms:  (UTA)  ADLScreening [facility] Assessment Services) Patient's cognitive ability adequate to safely complete daily activities?: No Patient able to express need for assistance with ADLs?: Yes Independently performs ADLs?: No (UTA but per collateral pt cannot preform all ADL's)  Prior Inpatient Therapy Prior Inpatient Therapy:  (UTA) Prior Therapy Dates:  (UTA) Prior Therapy Facilty/Provider(s):  (UTA) Reason for Treatment:  (UTA)  Prior Outpatient Therapy Prior Outpatient Therapy:   (UTA) Prior Therapy Dates:  (UTA) Prior Therapy Facilty/Provider(s):  (UTA) Reason for Treatment:  (UTA) Does patient have an ACCT team?: No Does patient have Intensive In-House Services?  :  (UTA) Does patient have [facility] services? : Unknown Does patient have P4CC services?:  (UTA)  ADL Screening (condition at time of admission) Patient's cognitive ability adequate to safely complete daily activities?: No Is the patient deaf or have difficulty hearing?: Yes Does the patient have difficulty seeing, even when wearing glasses/contacts?: Yes Does the patient have difficulty concentrating, remembering, or making decisions?: Yes Patient able to express need for assistance with ADLs?: Yes Does the patient have difficulty dressing or bathing?:  (UTA) Independently performs ADLs?: No (UTA but per collateral pt cannot preform all ADL's) Does the patient have difficulty walking or climbing stairs?:  (UTA) Weakness of Legs:  (UTA) Weakness of Arms/Hands:  (UTA)  Home Assistive Devices/Equipment Home Assistive Devices/Equipment:  (UTA)  Therapy Consults (therapy consults require a physician order) PT Evaluation Needed: No OT Evalulation Needed: No SLP Evaluation Needed: No Abuse/Neglect Assessment (Assessment to be complete while patient is alone) Physical Abuse:  (UTA) Verbal Abuse:  (UTA) Sexual Abuse:  (UTA) Exploitation of patient/patient's resources:  (UTA) Self-Neglect:  (UTA) Possible abuse reported to::  (UTA) Values / Beliefs Cultural Requests During Hospitalization:  (UTA) Spiritual Requests During Hospitalization:  (UTA) Consults Spiritual Care Consult Needed:  (UTA) Social Work Consult Needed:  Special educational needs teacher) Regulatory affairs officer ([place]) Does patient have an advance directive?: No Would patient like information on creating an advanced directive?: No - patient declined information    Additional Information 1:1 In Past 12 Months?:  (UTA) CIRT Risk:  (UTA) Elopement Risk:   (UTA) Does patient have medical clearance?: Yes     Disposition: Based on information obtained Lord DNP recommended a Gero-psych placement.  Disposition Initial Assessment Completed for this Encounter: Yes Disposition of Patient: Inpatient treatment program Type of inpatient treatment program: Adult  On Site Evaluation by:   Reviewed with Physician:    Mamie Nick 03/25/2016 1:48 PM

## 2016-03-25 NOTE — Progress Notes (Addendum)
CSW spoke with Ailene Ards, APS Social Worker, [place]. She stated patient has [employer] for insurance and the day program that patient attended was [employer] [employer], [address] [place], [place]. She also noted patient's PCP is Cyndy Freeze [phone], [place], [place]. She stated patient was still actively being seen at this practice. After staffing with NP, CSW informed her that patient would either be geriatric placement or SNF placement at this time. She noted if patient is discharged over the weekend for someone to leave a message on her line [phone] and to call Edwyna Shell, Attorney (POA) [phone] and inform him as well.    Message left for Asst. Director of Social Work to inform him of patient's current situation. Waiting for response.   Merry Proud, LCSWA Clinical Social Worker [phone] 3:18 PM

## 2016-03-25 NOTE — BH Assessment (Signed)
Jimmy Solis  Per Corena Pilgrim, MD, this pt requires psychiatric hospitalization at this time.  The following facilities have been contacted to seek placement for this pt, with results as noted:  Beds available, information sent, decision pending:  [name]   Not appropriate for referral:  [name] (due to dementia) [name] (due to dementia) [name] (due to dementia) [name] (due to dementia) [facility] (due to insurance panel)    Jalene Mullet, [place] Triage Specialist [phone]

## 2016-03-25 NOTE — Progress Notes (Addendum)
ED CM able to find in pt 2016 chart that pt pcp listed as [name] and in this 11/17 encounter pt coverage listed as [place] updated

## 2016-03-25 NOTE — ED Notes (Signed)
Noted chest rise and fall during hourly rounds

## 2016-03-25 NOTE — ED Notes (Signed)
Pt still sleeping, ED provider at the bedside trying to do psych eval but pt is still too drowsy at this time for assessment. Most likely his drowsiness to due to his night time medications.

## 2016-03-25 NOTE — Progress Notes (Signed)
CSW received telephone call from Edwyna Shell, [place] [phone] who states he was a second person as POA besides patient's caregiver, Rico Ala that committed suicide. 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 Levada Dy to sign as a second POA. He stated patient was living with Mr. Levada Dy and his Claudie Revering. He stated Mr. Levada Dy was taking patient to an adult day care program and he was the person taking care of patient. He stated he has not had any contact and he does not have a relationship with patient, therefore, he is unable to provide information. He stated he cannot take over the care of patient. 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 He provided contact information for [facility] father, Elgie Collard" Levada Dy [phone] or [phone]. CSW informed him that this contact information may be more beneficial for the APS Social Worker. He stated he had her contact information and he was going to give her a call. NP and Psychiatry Team updated.   Merry Proud, LCSWA Clinical Social Worker [phone] 2:29 PM

## 2016-03-25 NOTE — ED Notes (Addendum)
Pt [facility] been [facility]. The accepting doctor is Dr. Alonna Minium. Pt will need transport by sheriff because he is IVc, and sheriff not available to transport until tomorrow morning. Pt could double transport with pt in 28 who is transferring to [facility] as well tomorrow am. [facility] transport number is [phone]- I have left message with officer Pascal about need for transport.

## 2016-03-25 NOTE — Progress Notes (Signed)
ED CM updated pcp and noted ED registration in process of updating insurance coverage for pt

## 2016-03-25 NOTE — Progress Notes (Signed)
Pt found with no confirmed insurance per ED Registration and no pcp  CM noted pt with medications upon admission These pill bottles indicate pt with medications from Dr Margette Fast and Dr Clabe Seal filled at [facility] in [place]

## 2016-03-25 NOTE — ED Notes (Signed)
Patient transported to X-ray 

## 2016-03-25 NOTE — Progress Notes (Signed)
Pt has been accepted at [facility] by Dr. Alonna Minium.  Report number is [id number].  Bedside RN informed.  Creta Levin, LCSW Evening/ED Coverage [id number]

## 2016-03-25 NOTE — Progress Notes (Addendum)
ED CM called and left a message for Jimmy Solis (Other) [phone]  to attempt to find pt pcp and name of insurance CM had spoken with pt and he is not able to tell cm  ED RN states pt without wallet when he arrived from a daycare (? Name of daycare) No wallet found in pt belongings in pt locker

## 2016-03-25 NOTE — BHH Counselor (Signed)
03/24/16, at 2230: Clinician received a visit from [place], [place] for an update on the pt. Junie Panning reported, making a additional report to Adult Protective Services (ADS) regarding pt's discharge plans and uncertainty of his caregiver. Junie Panning reported, she attempted to contact the pt's case worker Veterinary surgeon, [phone]) with ADS to no avail. Junie Panning reported, pt was at an adult daycare and he reported wanting to kill himself. Junie Panning reported, pt's caregiver committed suicide yesterday (03/23/16), but the pt does not know. Erin reported calling pt's caregiver's fiance' Wendi Snipes, [phone]) to no avail. Junie Panning reported she is awaiting return call from [place] and [place]. Erin reported, per pt's chart  Pt's POA is an attorney name, Edwyna Shell [phone].  03/25/16 at 0037: Clinician attempted to assess pt however pt was asleep. Clinician received the assistance from [place], RN to rouse pt. Clinician asked pt for his name four times, pt did not respond. Clinician ask the pt if he would be able to participate however, the pt looked off, closed then opened his eyes. Clinician discussed with Topher, RN that the pt would not engage in the assessment.   Edd Fabian, MS, [facility], [facility] Triage Specialist [phone]

## 2016-03-25 NOTE — Progress Notes (Signed)
ED CM at [facility] when a male visitor spoke with TCU RN Male informed RN pt was a veteran and wanted to speak with CM - This is not the POA - CM referred male visitor to the [place]. Male aware of POA because he stated he would speak with Edwyna Shell

## 2016-03-25 NOTE — Progress Notes (Addendum)
7:26am- CSW received call from Wendi Snipes, fiancee of patient's caregiver (caregiver committed suicide). She wanted to know current status of patient. CSW informed her of uncertainty to speak with her regarding patient's case. CSW will follow-up as informed. Wendi Snipes [phone]  8:53am- CSW received call from [employer], Education officer, museum, [employer] Adult [employer]. CSW will return call once patient is seen by Psychiatrist. Crystal [phone]    Merry Proud, [place] Worker [phone] 9:23 AM

## 2016-03-25 NOTE — ED Notes (Signed)
Pt returned from xray

## 2016-03-25 NOTE — Progress Notes (Signed)
Psychiatrist has recommended geriatric psychiatric inpatient treatment for patient at this time. CSW spoke with [employer], Social Worker, [place] to inform her of patient's disposition, as patient has an open APS case. She stated she would need to stay informed of patient's disposition and if patient is discharged to a facility over the weekend to call and leave a message on her line [phone].  She also stated to call patient's (POA), Edwyna Shell, Attorney [phone] or his assistant, Lattie Haw.   CSW attempted call to Edwyna Shell, Attorney [facility]) and spoke with Network engineer who stated he was in court and his assistant was not in as well. Name and contact number left with secretary for return call.    Merry Proud, LCSWA Clinical Social Worker [phone] 11:42 AM

## 2016-03-25 NOTE — Progress Notes (Signed)
CM did receive a voice message from [place] left at 1:20 pm 03/25/16  But ED CM has spoke with ED SW after he called her and he did confirm he at this time did not know who pt pcp or insurance carrier were

## 2016-03-26 LAB — URINE CULTURE

## 2016-03-26 NOTE — ED Notes (Signed)
Pt resting will continue to monitor.  

## 2016-03-28 NOTE — Progress Notes (Addendum)
8:25am- Call received from Ailene Ards, [place] Social Worker regarding patient's disposition this AM (11.20.17). CSW informed her after review of recently discharged, patient had been accepted at [facility], [place] informed her information to call her was documented in the chart to inform her and the POA if patient was discharged over the weekend. She asked if fax could be sent of initial arrival and of patient's discharge acceptance. CSW faxed this information to [phone].   Merry Proud, LCSWA Clinical Social Worker [phone] 9:32 AM

## 2016-04-03 NOTE — Progress Notes (Deleted)
PATIENT: Jimmy Solis DOB: [birth year]  REASON FOR VISIT: follow up- Dementia, Central retinal artery Occulsion HISTORY FROM: patient  HISTORY OF PRESENT ILLNESS: HISTORY 09/28/15 Jimmy Solis is an 82 year old male with a history of a right central retinal artery occlusion and dementia. He returns today for follow-up. The patient is currently on Aricept and tolerating it well. He denies any significant changes. He lives with a family member. He is able to perform most ADLs with minimal assistance. He is not driving. He reports that he is sleeping well. He has a good appetite. Denies hallucinations or vivid dreams. Denies tremor. The patient continues on aspirin. He denies any new neurological symptoms. He returns today for an evaluation.  HISTORY 04/07/15 (WILLIS): Jimmy Solis is an 82 year old right-handed white male with a history of a right central retinal artery occlusion and a history of vascular dementia. The patient has an associated memory disorder, and a gait disorder. He does have urinary incontinence, but he has had a radical prostatectomy in the past. The patient has been in the emergency room on 23 January 2015 with a fall. The patient is using a walker, but he does not always remember to use it. The has had some ongoing progression of memory. MRI of the brain showed extensive white matter changes that affects the periventricular white matter as well as the brainstem. The patient has not had any change in his vision since last seen. He returned to this office for an evaluation. He is on blood pressure medications and low-dose aspirin.  REVIEW OF SYSTEMS: Out of a complete 14 system review of symptoms, the patient complains only of the following symptoms, and all other reviewed systems are negative.  ALLERGIES: Allergies  Allergen Reactions  . Penicillins Hives and Other (See Comments)    Has patient had a PCN reaction causing immediate rash, facial/tongue/throat swelling, SOB or  lightheadedness with hypotension: Unsure Has patient had a PCN reaction causing severe rash involving mucus membranes or skin necrosis: Unsure Has patient had a PCN reaction that required hospitalization Unsure Has patient had a PCN reaction occurring within the last 10 years: Unsure If all of the above answers are "NO", then may proceed with Cephalosporin use.  . Ativan [Lorazepam] Other (See Comments)    Reaction:  Hallucinations     HOME MEDICATIONS: Outpatient Medications Prior to Visit  Medication Sig Dispense Refill  . amLODipine (NORVASC) 5 MG tablet Take 1 tablet (5 mg total) by mouth daily. 30 tablet 0  . aspirin EC 81 MG tablet Take 81 mg by mouth daily.    Marland Kitchen donepezil (ARICEPT) 10 MG tablet Take 10 mg by mouth at bedtime.    Marland Kitchen escitalopram (LEXAPRO) 10 MG tablet Take 1 tablet (10 mg total) by mouth daily. 90 tablet 0  . feeding supplement, ENSURE ENLIVE, (ENSURE ENLIVE) LIQD Take 237 mLs by mouth 2 (two) times daily between meals. 237 mL 0  . memantine (NAMENDA) 10 MG tablet Take 1 tablet (10 mg total) by mouth 2 (two) times daily. 60 tablet 5  . traZODone (DESYREL) 50 MG tablet Take 50 mg by mouth at bedtime.    . triamcinolone cream (KENALOG) 0.1 % Apply 1 application topically 2 (two) times daily.     No facility-administered medications prior to visit.     PAST MEDICAL HISTORY: Past Medical History:  Diagnosis Date  . Abnormality of gait 04/07/2015  . Alzheimers disease   . Blind right eye   . Cancer of skin  of back   . 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 Depression    hx  . Falls frequently    "probably once/month" (01/16/2015)  . Prostate cancer ([place])   . Retinal detachment    left  . Stroke [facility]) ~ 12/2014   "lost vision in right eye"  . TIA (transient ischemic attack)    "several over the last couple years; brain scans have reflected that; sees /Dr. Jannifer Franklin" (01/16/2015)    PAST SURGICAL HISTORY: Past Surgical History:  Procedure Laterality  Date  . ABDOMINAL HERNIA REPAIR  ~ 2003  . CATARACT EXTRACTION W/ INTRAOCULAR LENS  IMPLANT, BILATERAL Bilateral ~ 2004  . EYE SURGERY    . HERNIA REPAIR    . PROSTATECTOMY  ~ 2001  . RETINAL DETACHMENT SURGERY Left ~ 2006  . SKIN CANCER EXCISION  ~ 2013   "back"  . TONSILLECTOMY      FAMILY HISTORY: Family History  Problem Relation Age of Onset  . Stroke Mother   . Lung cancer Brother   . Tuberculosis Father     SOCIAL HISTORY: Social History   Social History  . Marital status: Single    Spouse name: N/A  . Number of children: 0  . Years of education: Master's   Occupational History  . retired    Social History Main Topics  . Smoking status: Never Smoker  . Smokeless tobacco: Never Used  . Alcohol use No  . Drug use: No  . Sexual activity: Not on file   Other Topics Concern  . Not on file   Social History Narrative   Patient drinks 1 cup of caffeine daily.   Patient is right handed.         PHYSICAL EXAM  There were no vitals filed for this visit. There is no height or weight on file to calculate BMI.  Generalized: Well developed, in no acute distress   Neurological examination  Mentation: Alert oriented to time, place, history taking. Follows all commands speech and language fluent Cranial nerve II-XII: Pupils were equal round reactive to light. Extraocular movements were full, visual field were full on confrontational test. Facial sensation and strength were normal. Uvula tongue midline. Head turning and shoulder shrug  were normal and symmetric. Motor: The motor testing reveals 5 over 5 strength of all 4 extremities. Good symmetric motor tone is noted throughout.  Sensory: Sensory testing is intact to soft touch on all 4 extremities. No evidence of extinction is noted.  Coordination: Cerebellar testing reveals good finger-nose-finger and heel-to-shin bilaterally.  Gait and station: Gait is normal. Tandem gait is normal. Romberg is negative. No drift  is seen.  Reflexes: Deep tendon reflexes are symmetric and normal bilaterally.   DIAGNOSTIC DATA (LABS, IMAGING, TESTING) - I reviewed patient records, labs, notes, testing and imaging myself where available.  Lab Results  Component Value Date   WBC 7.5 03/24/2016   HGB 14.1 03/24/2016   HCT 41.4 03/24/2016   MCV 100.2 (H) 03/24/2016   PLT 268 03/24/2016      Component Value Date/Time   NA 134 (L) 03/24/2016 1653   K 3.7 03/24/2016 1653   CL 98 (L) 03/24/2016 1653   CO2 26 03/24/2016 1653   GLUCOSE 99 03/24/2016 1653   BUN 17 03/24/2016 1653   CREATININE 1.07 03/24/2016 1653   CALCIUM 9.3 03/24/2016 1653   PROT 8.1 03/24/2016 1653   ALBUMIN 4.4 03/24/2016 1653   AST 37 03/24/2016 1653   ALT  16 (L) 03/24/2016 1653   ALKPHOS 168 (H) 03/24/2016 1653   BILITOT 0.7 03/24/2016 1653   GFRNONAA >60 03/24/2016 1653   GFRAA >60 03/24/2016 1653   No results found for: CHOL, HDL, LDLCALC, LDLDIRECT, TRIG, CHOLHDL No results found for: HGBA1C No results found for: VITAMINB12 Lab Results  Component Value Date   TSH 2.681 03/25/2016      ASSESSMENT AND PLAN 82 y.o. year old male  has a past medical history of Abnormality of gait (04/07/2015); Alzheimers disease; Blind right eye; Cancer of skin of back; CRAO (central retinal artery occlusion) (12/02/2014); Depression; Falls frequently; Prostate cancer ([place]); Retinal detachment; Stroke ([place]) (~ 12/2014); and TIA (transient ischemic attack). here with ***     Ward Givens, MSN, NP-C 04/03/2016, 7:53 PM [facility] [address] [place], [place] [address] [phone]

## 2016-04-04 ENCOUNTER — Ambulatory Visit: Payer: Medicare Other | Admitting: Adult Health

## 2016-04-04 ENCOUNTER — Telehealth: Payer: Self-pay

## 2016-04-04 NOTE — Telephone Encounter (Signed)
Patient did not show to appt today  

## 2016-04-05 ENCOUNTER — Encounter: Payer: Self-pay | Admitting: Adult Health

## 2016-04-16 ENCOUNTER — Other Ambulatory Visit: Payer: Self-pay | Admitting: Adult Health

## 2016-05-01 ENCOUNTER — Emergency Department (HOSPITAL_COMMUNITY): Payer: Medicare Other

## 2016-05-01 ENCOUNTER — Emergency Department (HOSPITAL_COMMUNITY)
Admission: EM | Admit: 2016-05-01 | Discharge: 2016-05-02 | Disposition: A | Discharge status: Home / Self Care | Payer: Medicare Other | Attending: Emergency Medicine | Admitting: Emergency Medicine

## 2016-05-01 ENCOUNTER — Other Ambulatory Visit: Payer: Self-pay | Admitting: Neurology

## 2016-05-01 ENCOUNTER — Encounter (HOSPITAL_COMMUNITY): Payer: Self-pay | Admitting: Nurse Practitioner

## 2016-05-01 DIAGNOSIS — Z8546 Personal history of malignant neoplasm of prostate: Secondary | ICD-10-CM | POA: Insufficient documentation

## 2016-05-01 DIAGNOSIS — I10 Essential (primary) hypertension: Secondary | ICD-10-CM | POA: Diagnosis not present

## 2016-05-01 DIAGNOSIS — Y939 Activity, unspecified: Secondary | ICD-10-CM | POA: Diagnosis not present

## 2016-05-01 DIAGNOSIS — S0083XA Contusion of other part of head, initial encounter: Secondary | ICD-10-CM | POA: Diagnosis not present

## 2016-05-01 DIAGNOSIS — Y929 Unspecified place or not applicable: Secondary | ICD-10-CM | POA: Insufficient documentation

## 2016-05-01 DIAGNOSIS — Y999 Unspecified external cause status: Secondary | ICD-10-CM | POA: Insufficient documentation

## 2016-05-01 DIAGNOSIS — Z8673 Personal history of transient ischemic attack (TIA), and cerebral infarction without residual deficits: Secondary | ICD-10-CM | POA: Diagnosis not present

## 2016-05-01 DIAGNOSIS — Z7982 Long term (current) use of aspirin: Secondary | ICD-10-CM | POA: Diagnosis not present

## 2016-05-01 DIAGNOSIS — S0990XA Unspecified injury of head, initial encounter: Secondary | ICD-10-CM | POA: Diagnosis present

## 2016-05-01 DIAGNOSIS — G309 Alzheimer's disease, unspecified: Secondary | ICD-10-CM | POA: Insufficient documentation

## 2016-05-01 DIAGNOSIS — Z85828 Personal history of other malignant neoplasm of skin: Secondary | ICD-10-CM | POA: Insufficient documentation

## 2016-05-01 LAB — BASIC METABOLIC PANEL
Anion gap: 6 (ref 5–15)
BUN: 15 mg/dL (ref 6–20)
CALCIUM: 8.9 mg/dL (ref 8.9–10.3)
CO2: 30 mmol/L (ref 22–32)
CREATININE: 0.97 mg/dL (ref 0.61–1.24)
Chloride: 105 mmol/L (ref 101–111)
GFR calc non Af Amer: >60 mL/min (ref >60)
Glucose, Bld: 94 mg/dL (ref 65–99)
Potassium: 3.4 mmol/L — ABNORMAL LOW (ref 3.5–5.1)
SODIUM: 141 mmol/L (ref 135–145)

## 2016-05-01 LAB — CBC
HCT: 32.8 % — ABNORMAL LOW (ref 39.0–52.0)
Hemoglobin: 11 g/dL — ABNORMAL LOW (ref 13.0–17.0)
MCH: 34 pg (ref 26.0–34.0)
MCHC: 33.5 g/dL (ref 30.0–36.0)
MCV: 101.2 fL — ABNORMAL HIGH (ref 78.0–100.0)
Platelets: 232 10*3/uL (ref 150–400)
RBC: 3.24 MIL/uL — ABNORMAL LOW (ref 4.22–5.81)
RDW: 15 % (ref 11.5–15.5)
WBC: 6.4 10*3/uL (ref 4.0–10.5)

## 2016-05-01 MED ORDER — SODIUM CHLORIDE 0.9 % IV BOLUS (SEPSIS)
1000.0000 mL | Freq: Once | INTRAVENOUS | Status: AC
  Administered 2016-05-01: 1000 mL via INTRAVENOUS

## 2016-05-01 NOTE — ED Notes (Signed)
Attempted In and Out cath but met with significant resistance Condom cath applied Will monitor for enough urine to send to lab RN made aware
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
## 2016-05-02 LAB — URINALYSIS, ROUTINE W REFLEX MICROSCOPIC
Bacteria, UA: NONE SEEN
Bilirubin Urine: NEGATIVE
GLUCOSE, UA: NEGATIVE mg/dL
KETONES UR: NEGATIVE mg/dL
NITRITE: NEGATIVE
Protein, ur: NEGATIVE mg/dL
Specific Gravity, Urine: 1.004 — ABNORMAL LOW (ref 1.005–1.030)
pH: 7 (ref 5.0–8.0)
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
## 2016-05-02 NOTE — ED Notes (Signed)
Patient denies pain and is resting comfortably.  

## 2016-08-27 ENCOUNTER — Encounter (HOSPITAL_COMMUNITY): Payer: Self-pay

## 2016-08-27 ENCOUNTER — Emergency Department (HOSPITAL_COMMUNITY)
Admission: EM | Admit: 2016-08-27 | Discharge: 2016-08-27 | Disposition: A | Discharge status: Home / Self Care | Payer: Medicare Other | Attending: Emergency Medicine | Admitting: Emergency Medicine

## 2016-08-27 ENCOUNTER — Emergency Department (HOSPITAL_COMMUNITY): Payer: Medicare Other

## 2016-08-27 DIAGNOSIS — Z7982 Long term (current) use of aspirin: Secondary | ICD-10-CM | POA: Insufficient documentation

## 2016-08-27 DIAGNOSIS — Z79899 Other long term (current) drug therapy: Secondary | ICD-10-CM | POA: Insufficient documentation

## 2016-08-27 DIAGNOSIS — G309 Alzheimer's disease, unspecified: Secondary | ICD-10-CM | POA: Insufficient documentation

## 2016-08-27 DIAGNOSIS — Z8673 Personal history of transient ischemic attack (TIA), and cerebral infarction without residual deficits: Secondary | ICD-10-CM | POA: Diagnosis not present

## 2016-08-27 DIAGNOSIS — W19XXXA Unspecified fall, initial encounter: Secondary | ICD-10-CM

## 2016-08-27 DIAGNOSIS — Y999 Unspecified external cause status: Secondary | ICD-10-CM | POA: Diagnosis not present

## 2016-08-27 DIAGNOSIS — Z85828 Personal history of other malignant neoplasm of skin: Secondary | ICD-10-CM | POA: Insufficient documentation

## 2016-08-27 DIAGNOSIS — S0990XA Unspecified injury of head, initial encounter: Secondary | ICD-10-CM | POA: Insufficient documentation

## 2016-08-27 DIAGNOSIS — Z8546 Personal history of malignant neoplasm of prostate: Secondary | ICD-10-CM | POA: Insufficient documentation

## 2016-08-27 DIAGNOSIS — Y929 Unspecified place or not applicable: Secondary | ICD-10-CM | POA: Insufficient documentation

## 2016-08-27 DIAGNOSIS — Y939 Activity, unspecified: Secondary | ICD-10-CM | POA: Diagnosis not present

## 2016-08-27 DIAGNOSIS — W1830XA Fall on same level, unspecified, initial encounter: Secondary | ICD-10-CM | POA: Insufficient documentation
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
## 2016-08-27 NOTE — ED Provider Notes (Signed)
[place] DEPT Provider Note   CSN: [id number] Arrival date & time: 08/27/16  0910     History   Chief Complaint Chief Complaint  Patient presents with  . Fall  . Head Injury    HPI Jimmy Solis is a 83 y.o. male.  Patient is an 83 year old male with history of dementia. He presents after a fall at his extended care facility. He was apparently getting up to go to the bathroom when he fell backward and hit the back of his head. There is no reported loss of consciousness and the patient denies headache or neck pain. He denies any other injury and is not complaining of any other symptoms. History is somewhat limited secondary to his history of dementia.   The history is provided by the patient.  Fall  This is a new problem. The current episode started 1 to 2 hours ago. The problem occurs constantly. The problem has not changed since onset.Pertinent negatives include no chest pain, no headaches and no shortness of breath. Nothing aggravates the symptoms. Nothing relieves the symptoms. He has tried nothing for the symptoms.    Past Medical History:  Diagnosis Date  . Abnormality of gait 04/07/2015  . Alzheimers disease   . Blind right eye   . Cancer of skin of back   . 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 Depression    hx  . Falls frequently    "probably once/month" (01/16/2015)  . Prostate cancer ([place])   . Retinal detachment    left  . Stroke [facility] ~ 12/2014   "lost vision in right eye"  . TIA (transient ischemic attack)    "several over the last couple years; brain scans have reflected that; sees /Dr. Jannifer Franklin" (01/16/2015)    Patient Active Problem List   Diagnosis Date Noted  . Dementia with behavioral disturbance 03/25/2016  . Vascular dementia 04/07/2015  . Abnormality of gait 04/07/2015  . Essential hypertension 02/20/2015  . Aspiration pneumonia ([place]) 01/23/2015  . Fall   . Dysphagia   . Pressure ulcer 01/17/2015  . Rhabdomyolysis  01/16/2015  . Delirium 01/16/2015  . 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 Memory deficits 12/02/2014    Past Surgical History:  Procedure Laterality Date  . ABDOMINAL HERNIA REPAIR  ~ 2003  . CATARACT EXTRACTION W/ INTRAOCULAR LENS  IMPLANT, BILATERAL Bilateral ~ 2004  . EYE SURGERY    . HERNIA REPAIR    . PROSTATECTOMY  ~ 2001  . RETINAL DETACHMENT SURGERY Left ~ 2006  . SKIN CANCER EXCISION  ~ 2013   "back"  . TONSILLECTOMY         Home Medications    Prior to Admission medications   Medication Sig Start Date End Date Taking? Authorizing Provider  acetaminophen (TYLENOL) 500 MG tablet Take 500 mg by mouth every 4 (four) hours as needed for moderate pain.    Historical Provider, MD  alum & mag hydroxide-simeth ([place]) 200-200-20 MG/5ML suspension Take 30 mLs by mouth every 6 (six) hours as needed for indigestion or heartburn.    Historical Provider, MD  amLODipine (NORVASC) 5 MG tablet Take 1 tablet (5 mg total) by mouth daily. 01/23/15   Donne Hazel, MD  aspirin EC 81 MG tablet Take 81 mg by mouth daily.    Historical Provider, MD  donepezil (ARICEPT) 10 MG tablet Take 10 mg by mouth at bedtime.    Historical Provider, MD  escitalopram (LEXAPRO) 10 MG tablet TAKE 1  TABLET BY MOUTH DAILY 05/04/16   Kathrynn Ducking, MD  guaifenesin (ROBITUSSIN) 100 MG/5ML syrup Take 200 mg by mouth every 6 (six) hours as needed for cough.    Historical Provider, MD  loperamide (IMODIUM A-D) 2 MG tablet Take 2 mg by mouth 4 (four) times daily as needed for diarrhea or loose stools.    Historical Provider, MD  magnesium hydroxide (MILK OF MAGNESIA) 400 MG/5ML suspension Take 30 mLs by mouth at bedtime as needed for mild constipation.    Historical Provider, MD  memantine (NAMENDA) 10 MG tablet TAKE 1 TABLET (10 MG TOTAL) BY MOUTH 2 (TWO) TIMES DAILY. 04/19/16   Ward Givens, NP  mirtazapine (REMERON) 15 MG tablet Take 15 mg by mouth at bedtime.    Historical Provider, MD    neomycin-bacitracin-polymyxin (NEOSPORIN) [phone] ointment Apply 1 application topically daily as needed (skin tear). Clean area with normal saline, apply neosporin and cover with band-air or guaze and tape change as needed until healed.    Historical Provider, MD  traZODone (DESYREL) 50 MG tablet Take 50 mg by mouth at bedtime as needed for sleep.     Historical Provider, MD  triamcinolone cream (KENALOG) 0.1 % Apply 1 application topically 2 (two) times daily.    Historical Provider, MD    Family History Family History  Problem Relation Age of Onset  . Stroke Mother   . Lung cancer Brother   . Tuberculosis Father     Social History Social History  Substance Use Topics  . Smoking status: Never Smoker  . Smokeless tobacco: Never Used  . Alcohol use No     Allergies   Penicillins and Ativan [lorazepam]   Review of Systems Review of Systems  Respiratory: Negative for shortness of breath.   Cardiovascular: Negative for chest pain.  Neurological: Negative for headaches.  All other systems reviewed and are negative.    Physical Exam Updated Vital Signs BP (!) 166/119 (BP Location: Right Arm)   Pulse (!) 57   Temp 98.2 F (36.8 C) (Oral)   Resp 16   SpO2 99%   Physical Exam  Constitutional: He is oriented to person, place, and time. He appears well-developed and well-nourished. No distress.  HENT:  Head: Normocephalic and atraumatic.  Mouth/Throat: Oropharynx is clear and moist.  There is mild tenderness and swelling to the occiput.  Eyes: EOM are normal. Pupils are equal, round, and reactive to light.  Neck: Normal range of motion. Neck supple.  There is no cervical spine tenderness or step-off. He has painless range of motion in all directions.  Cardiovascular: Normal rate and regular rhythm.  Exam reveals no friction rub.   No murmur heard. Pulmonary/Chest: Effort normal and breath sounds normal. No respiratory distress. He has no wheezes. He has no rales.   Abdominal: Soft. Bowel sounds are normal. He exhibits no distension. There is no tenderness.  Musculoskeletal: Normal range of motion. He exhibits no edema.  Neurological: He is alert and oriented to person, place, and time. No cranial nerve deficit. He exhibits normal muscle tone. Coordination normal.  Skin: Skin is warm and dry. He is not diaphoretic.  Nursing note and vitals reviewed.    ED Treatments / Results  Labs (all labs ordered are listed, but only abnormal results are displayed) Labs Reviewed - No data to display  EKG  EKG Interpretation None       Radiology No results found.  Procedures Procedures (including critical care time)  Medications Ordered in  ED Medications - No data to display   Initial Impression / Assessment and Plan / ED Course  I have reviewed the triage vital signs and the nursing notes.  Pertinent labs & imaging results that were available during my care of the patient were reviewed by me and considered in my medical decision making (see chart for details).  Head CT is negative and patient is neurologically intact. He will be discharged, to return as needed for any problems.  Final Clinical Impressions(s) / ED Diagnoses   Final diagnoses:  None    New Prescriptions New Prescriptions   No medications on file     Veryl Speak, MD 08/27/16 1057

## 2016-08-27 NOTE — ED Notes (Signed)
Bed alarm placed for patient safety.

## 2016-08-27 NOTE — ED Triage Notes (Signed)
Per EMS, pt from [facility] [place].  Pt slipped on wet floor striking back of head.  Hematoma present.  Pt uses a walker.  No LOC.  No thinners.  No back pain.  Pt is oriented to baseline.  Pt has dementia.  Vitals:  192/8-, hr 60, resp 15, cbg 110

## 2016-08-27 NOTE — ED Notes (Signed)
Transport to [facility]

## 2016-08-27 NOTE — Discharge Instructions (Signed)
Return to the emergency department for severe headache, worsening confusion, unsteady gait, difficulty waking, or other new and concerning symptoms.

## 2016-09-10 ENCOUNTER — Emergency Department (HOSPITAL_COMMUNITY): Payer: Medicare Other

## 2016-09-10 ENCOUNTER — Encounter (HOSPITAL_COMMUNITY): Payer: Self-pay | Admitting: Emergency Medicine

## 2016-09-10 ENCOUNTER — Emergency Department (HOSPITAL_COMMUNITY)
Admission: EM | Admit: 2016-09-10 | Discharge: 2016-09-11 | Disposition: A | Discharge status: Home / Self Care | Payer: Medicare Other | Attending: Emergency Medicine | Admitting: Emergency Medicine

## 2016-09-10 DIAGNOSIS — Z8673 Personal history of transient ischemic attack (TIA), and cerebral infarction without residual deficits: Secondary | ICD-10-CM | POA: Diagnosis not present

## 2016-09-10 DIAGNOSIS — Z85828 Personal history of other malignant neoplasm of skin: Secondary | ICD-10-CM | POA: Diagnosis not present

## 2016-09-10 DIAGNOSIS — Y92129 Unspecified place in nursing home as the place of occurrence of the external cause: Secondary | ICD-10-CM | POA: Diagnosis not present

## 2016-09-10 DIAGNOSIS — F0390 Unspecified dementia without behavioral disturbance: Secondary | ICD-10-CM | POA: Insufficient documentation

## 2016-09-10 DIAGNOSIS — Z79899 Other long term (current) drug therapy: Secondary | ICD-10-CM | POA: Insufficient documentation

## 2016-09-10 DIAGNOSIS — Y939 Activity, unspecified: Secondary | ICD-10-CM | POA: Diagnosis not present

## 2016-09-10 DIAGNOSIS — W19XXXA Unspecified fall, initial encounter: Secondary | ICD-10-CM | POA: Insufficient documentation

## 2016-09-10 DIAGNOSIS — R296 Repeated falls: Secondary | ICD-10-CM

## 2016-09-10 DIAGNOSIS — Y999 Unspecified external cause status: Secondary | ICD-10-CM | POA: Insufficient documentation

## 2016-09-10 DIAGNOSIS — Z7982 Long term (current) use of aspirin: Secondary | ICD-10-CM | POA: Insufficient documentation

## 2016-09-10 DIAGNOSIS — I10 Essential (primary) hypertension: Secondary | ICD-10-CM | POA: Diagnosis not present

## 2016-09-10 NOTE — ED Notes (Signed)
Bed: QT62 Expected date:  Expected time:  Means of arrival:  Comments: [place]

## 2016-09-10 NOTE — ED Provider Notes (Signed)
[place] DEPT Provider Note   CSN: [id number] Arrival date & time: 09/10/16  2246  By signing my name below, I, Jimmy Solis, attest that this documentation has been prepared under the direction and in the presence of physician practitioner, Delora Fuel, MD. Electronically Signed: Dora Solis, Scribe. 09/10/2016. 11:23 PM.  History   Chief Complaint Chief Complaint  Patient presents with  . Fall   The history is provided by the patient and medical records. No language interpreter was used.    HPI Comments: LEVEL 5 CAVEAT DUE TO DEMENTIA Jimmy Solis is a 83 y.o. male with PMHx including Alzheimer's dementia, frequent falls, and stroke who presents to the Emergency Department via EMS for evaluation of an unwitnessed fall earlier tonight. Per the nursing staff, patient was found lying on his bedroom floor without any notable wounds or injuries. He lives at [facility]. No other complaints noted at this time.  Past Medical History:  Diagnosis Date  . Abnormality of gait 04/07/2015  . Alzheimers disease   . Blind right eye   . Cancer of skin of back   . 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 Depression    hx  . Falls frequently    "probably once/month" (01/16/2015)  . Prostate cancer ([place])   . Retinal detachment    left  . Stroke [facility]) ~ 12/2014   "lost vision in right eye"  . TIA (transient ischemic attack)    "several over the last couple years; brain scans have reflected that; sees /Dr. Jannifer Franklin" (01/16/2015)    Patient Active Problem List   Diagnosis Date Noted  . Dementia with behavioral disturbance 03/25/2016  . Vascular dementia 04/07/2015  . Abnormality of gait 04/07/2015  . Essential hypertension 02/20/2015  . Aspiration pneumonia ([place]) 01/23/2015  . Fall   . Dysphagia   . Pressure ulcer 01/17/2015  . Rhabdomyolysis 01/16/2015  . Delirium 01/16/2015  . 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 Memory deficits  12/02/2014    Past Surgical History:  Procedure Laterality Date  . ABDOMINAL HERNIA REPAIR  ~ 2003  . CATARACT EXTRACTION W/ INTRAOCULAR LENS  IMPLANT, BILATERAL Bilateral ~ 2004  . EYE SURGERY    . HERNIA REPAIR    . PROSTATECTOMY  ~ 2001  . RETINAL DETACHMENT SURGERY Left ~ 2006  . SKIN CANCER EXCISION  ~ 2013   "back"  . TONSILLECTOMY         Home Medications    Prior to Admission medications   Medication Sig Start Date End Date Taking? Authorizing Provider  acetaminophen (TYLENOL) 500 MG tablet Take 500 mg by mouth every 4 (four) hours as needed for moderate pain.    [provider]  alum & mag hydroxide-simeth ([place]) 200-200-20 MG/5ML suspension Take 30 mLs by mouth every 6 (six) hours as needed for indigestion or heartburn.    [provider]  amLODipine (NORVASC) 5 MG tablet Take 1 tablet (5 mg total) by mouth daily. 01/23/15   Donne Hazel, MD  aspirin EC 81 MG tablet Take 81 mg by mouth daily.    [provider]  donepezil (ARICEPT) 10 MG tablet Take 10 mg by mouth at bedtime.    [provider]  escitalopram (LEXAPRO) 10 MG tablet TAKE 1 TABLET BY MOUTH DAILY 05/04/16   Kathrynn Ducking, MD  guaifenesin (ROBITUSSIN) 100 MG/5ML syrup Take 200 mg by mouth every 6 (six) hours as needed for cough.    [provider]  loperamide (IMODIUM A-D) 2 MG tablet Take 2 mg by mouth 4 (four) times daily as needed for diarrhea or loose stools.    [provider]  magnesium hydroxide (MILK OF MAGNESIA) 400 MG/5ML suspension Take 30 mLs by mouth at bedtime as needed for mild constipation.    [provider]  memantine (NAMENDA) 10 MG tablet TAKE 1 TABLET (10 MG TOTAL) BY MOUTH 2 (TWO) TIMES DAILY. 04/19/16   Ward Givens, NP  mirtazapine (REMERON) 15 MG tablet Take 15 mg by mouth at bedtime.    [provider]  neomycin-bacitracin-polymyxin (NEOSPORIN) [phone] ointment Apply 1 application topically daily as  needed (skin tear). Clean area with normal saline, apply neosporin and cover with band-air or guaze and tape change as needed until healed.    [provider]  traZODone (DESYREL) 50 MG tablet Take 50 mg by mouth at bedtime as needed for sleep.     [provider]  triamcinolone cream (KENALOG) 0.1 % Apply 1 application topically 2 (two) times daily.    [provider]    Family History Family History  Problem Relation Age of Onset  . Stroke Mother   . Lung cancer Brother   . Tuberculosis Father     Social History Social History  Substance Use Topics  . Smoking status: Never Smoker  . Smokeless tobacco: Never Used  . Alcohol use No     Allergies   Penicillins and Ativan [lorazepam]   Review of Systems Review of Systems  Unable to perform ROS: Dementia   Physical Exam Updated Vital Signs BP (!) 143/74   Pulse 60   Temp 97.8 F (36.6 C) (Oral)   Resp 18   SpO2 99%   Physical Exam  Constitutional: He appears well-developed and well-nourished.  HENT:  Head: Normocephalic and atraumatic.  Eyes: EOM are normal. Pupils are equal, round, and reactive to light.  Neck: Normal range of motion. No JVD present.  Non-tender. Immobilized with a towel roll.  Cardiovascular: Normal rate, regular rhythm and normal heart sounds.   No murmur heard. Pulmonary/Chest: Effort normal and breath sounds normal. He has no wheezes. He has no rales. He exhibits no tenderness.  Abdominal: Soft. Bowel sounds are normal. He exhibits no distension and no mass. There is no tenderness.  Musculoskeletal: Normal range of motion. He exhibits no edema.  Lymphadenopathy:    He has no cervical adenopathy.  Neurological: He is alert. No cranial nerve deficit. He exhibits normal muscle tone. Coordination normal.  Oriented to person and place but not time.  Skin: Skin is warm and dry. No rash noted.  Psychiatric: He has a normal mood and affect. His behavior is normal.  Nursing  note and vitals reviewed.  ED Treatments / Results   Radiology Ct Head Wo Contrast  Result Date: 09/11/2016 CLINICAL DATA:  Unwitnessed fall EXAM: CT HEAD WITHOUT CONTRAST CT CERVICAL SPINE WITHOUT CONTRAST TECHNIQUE: Multidetector CT imaging of the head and cervical spine was performed following the standard protocol without intravenous contrast. Multiplanar CT image reconstructions of the cervical spine were also generated. COMPARISON:  08/27/2016, 01/16/2015 FINDINGS: CT HEAD FINDINGS Brain: No acute territorial infarction, hemorrhage or intracranial mass is visualized. Extensive periventricular, subcortical and deep white matter small vessel ischemic changes. Moderate atrophy. Stable ventricle size. Vascular: No hyperdense vessels.  Carotid artery calcifications. Skull: No fracture or suspicious bone lesion Sinuses/Orbits: No acute finding. Other: None CT CERVICAL SPINE FINDINGS Alignment: Nonstandard coronal views due to positioning.  Trace anterolisthesis of C7 on T1, unchanged. Facet alignment within normal limits. Skull base and vertebrae: Craniovertebral junction is intact. No fracture is seen. Scattered lucent lesions in C2, C6 and C7 are unchanged Soft tissues and spinal canal: No prevertebral fluid or swelling. No visible canal hematoma. Disc levels: Multilevel degenerative disc changes, mild at C2-C3, moderate severe at C3-C4 and C4-C5, severe at C5-C6 and C6-C7. Posterior disc osteophyte complex at C3-C4 and C5-C6. Multilevel pop hypertrophic facet arthropathy results in multilevel foraminal stenosis. Upper chest: Lung apices are clear. Thyroid within normal limits. Carotid artery calcification. Other: None IMPRESSION: 1. No CT evidence for acute intracranial abnormality. Atrophy with extensive white matter small vessel ischemic changes 2. Moderate severe multilevel degenerative disc changes. No acute fracture is seen. Electronically Signed   By: Donavan Foil M.D.   On: 09/11/2016 00:26   Ct  Cervical Spine Wo Contrast  Result Date: 09/11/2016 CLINICAL DATA:  Unwitnessed fall EXAM: CT HEAD WITHOUT CONTRAST CT CERVICAL SPINE WITHOUT CONTRAST TECHNIQUE: Multidetector CT imaging of the head and cervical spine was performed following the standard protocol without intravenous contrast. Multiplanar CT image reconstructions of the cervical spine were also generated. COMPARISON:  08/27/2016, 01/16/2015 FINDINGS: CT HEAD FINDINGS Brain: No acute territorial infarction, hemorrhage or intracranial mass is visualized. Extensive periventricular, subcortical and deep white matter small vessel ischemic changes. Moderate atrophy. Stable ventricle size. Vascular: No hyperdense vessels.  Carotid artery calcifications. Skull: No fracture or suspicious bone lesion Sinuses/Orbits: No acute finding. Other: None CT CERVICAL SPINE FINDINGS Alignment: Nonstandard coronal views due to positioning. Trace anterolisthesis of C7 on T1, unchanged. Facet alignment within normal limits. Skull base and vertebrae: Craniovertebral junction is intact. No fracture is seen. Scattered lucent lesions in C2, C6 and C7 are unchanged Soft tissues and spinal canal: No prevertebral fluid or swelling. No visible canal hematoma. Disc levels: Multilevel degenerative disc changes, mild at C2-C3, moderate severe at C3-C4 and C4-C5, severe at C5-C6 and C6-C7. Posterior disc osteophyte complex at C3-C4 and C5-C6. Multilevel pop hypertrophic facet arthropathy results in multilevel foraminal stenosis. Upper chest: Lung apices are clear. Thyroid within normal limits. Carotid artery calcification. Other: None IMPRESSION: 1. No CT evidence for acute intracranial abnormality. Atrophy with extensive white matter small vessel ischemic changes 2. Moderate severe multilevel degenerative disc changes. No acute fracture is seen. Electronically Signed   By: Donavan Foil M.D.   On: 09/11/2016 00:26    Procedures Procedures (including critical care  time)  DIAGNOSTIC STUDIES: Oxygen Saturation is 99% on RA, normal by my interpretation.    Medications Ordered in ED Medications - No data to display   Initial Impression / Assessment and Plan / ED Course  I have reviewed the triage vital signs and the nursing notes.  Pertinent imaging results that were available during my care of the patient were reviewed by me and considered in my medical decision making (see chart for details).  Fall without apparent injury. Patient with long-standing dementia. Mental status apparently is at his baseline. Review of old records shows a prior ED visits for fall. We'll send for CT of head and cervical spine.  CT scans show no significant injury and he is discharged back to his nursing care facility.  Final Clinical Impressions(s) / ED Diagnoses   Final diagnoses:  Unwitnessed fall  Dementia without behavioral disturbance, unspecified dementia type  Essential hypertension   New Prescriptions New Prescriptions   No medications on file   I personally performed the services described in  this documentation, which was scribed in my presence. The recorded information has been reviewed and is accurate.      Delora Fuel, MD 93/79/02 [phone]

## 2016-09-10 NOTE — ED Triage Notes (Signed)
Brought in by EMS from [facility] NH facility for evaluation after his unwitnessed fall tonight.  Pt was found lying on his bedroom floor.  No s/s apparent injury noted by NH staff.  Pt denies pain or discomfort.  Pt on ASA daily; has Alzheimer's dementia.

## 2016-09-11 NOTE — ED Notes (Signed)
[facility] here to transport pt back to [facility]. 

## 2016-09-11 NOTE — ED Notes (Signed)
Bed: [facility] Expected date: 09/09/16 Expected time:  Means of arrival:  Comments: No bed.

## 2016-09-11 NOTE — ED Notes (Signed)
[facility] was called for pt's transportation back to [facility].

## 2016-10-14 ENCOUNTER — Ambulatory Visit (INDEPENDENT_AMBULATORY_CARE_PROVIDER_SITE_OTHER): Payer: Medicare Other | Admitting: Ophthalmology

## 2016-12-22 ENCOUNTER — Emergency Department (HOSPITAL_COMMUNITY)
Admission: EM | Admit: 2016-12-22 | Discharge: 2016-12-22 | Disposition: A | Discharge status: Home / Self Care | Payer: Medicare Other | Attending: Emergency Medicine | Admitting: Emergency Medicine

## 2016-12-22 ENCOUNTER — Emergency Department (HOSPITAL_COMMUNITY): Payer: Medicare Other

## 2016-12-22 ENCOUNTER — Encounter (HOSPITAL_COMMUNITY): Payer: Self-pay | Admitting: Emergency Medicine

## 2016-12-22 DIAGNOSIS — S0990XA Unspecified injury of head, initial encounter: Secondary | ICD-10-CM | POA: Diagnosis present

## 2016-12-22 DIAGNOSIS — Y998 Other external cause status: Secondary | ICD-10-CM | POA: Diagnosis not present

## 2016-12-22 DIAGNOSIS — Y92199 Unspecified place in other specified residential institution as the place of occurrence of the external cause: Secondary | ICD-10-CM | POA: Insufficient documentation

## 2016-12-22 DIAGNOSIS — G309 Alzheimer's disease, unspecified: Secondary | ICD-10-CM | POA: Diagnosis not present

## 2016-12-22 DIAGNOSIS — Y9301 Activity, walking, marching and hiking: Secondary | ICD-10-CM | POA: Diagnosis not present

## 2016-12-22 DIAGNOSIS — Z7982 Long term (current) use of aspirin: Secondary | ICD-10-CM | POA: Insufficient documentation

## 2016-12-22 DIAGNOSIS — Z79899 Other long term (current) drug therapy: Secondary | ICD-10-CM | POA: Diagnosis not present

## 2016-12-22 DIAGNOSIS — Z85828 Personal history of other malignant neoplasm of skin: Secondary | ICD-10-CM | POA: Diagnosis not present

## 2016-12-22 DIAGNOSIS — Z8673 Personal history of transient ischemic attack (TIA), and cerebral infarction without residual deficits: Secondary | ICD-10-CM | POA: Diagnosis not present

## 2016-12-22 DIAGNOSIS — W01198A Fall on same level from slipping, tripping and stumbling with subsequent striking against other object, initial encounter: Secondary | ICD-10-CM | POA: Insufficient documentation

## 2016-12-22 DIAGNOSIS — I10 Essential (primary) hypertension: Secondary | ICD-10-CM | POA: Insufficient documentation

## 2016-12-22 DIAGNOSIS — W19XXXA Unspecified fall, initial encounter: Secondary | ICD-10-CM

## 2016-12-22 NOTE — ED Triage Notes (Signed)
Per EMS, pt is coming from [facility] after experiencing a witnessed fall. Pt was ambulating without his walker with the assistance of a tech and fell and hit the back of his head. EMS reports no obvious injuries. Pt takes a daily dose of 81 mg aspirin. Pt has hx of dementia and only alert to self.

## 2016-12-22 NOTE — ED Provider Notes (Signed)
[facility] DEPT Provider Note   CSN: [id number] Arrival date & time: 12/22/16  1749     History   Chief Complaint Chief Complaint  Patient presents with  . Fall    HPI Jimmy Solis is a 83 y.o. male.  HPI 83 y.o. male, presents to the Emergency Department today from EMS due to witnessed mechanical fall from [facility]. Pt ambulated without walker, but with the assistance of a tech. [place] backwards and struck the back of his head. No LOC. No obvious injuries noted. Pt takes 81mg  ASA. No blood thinners. Moving x 4 extremities. No emesis noted.  Level V Caveat: Dementia  Past Medical History:  Diagnosis Date  . Abnormality of gait 04/07/2015  . Alzheimers disease   . Blind right eye   . Cancer of skin of back   . 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 Depression    hx  . Falls frequently    "probably once/month" (01/16/2015)  . Prostate cancer ([place])   . Retinal detachment    left  . Stroke [facility]) ~ 12/2014   "lost vision in right eye"  . TIA (transient ischemic attack)    "several over the last couple years; brain scans have reflected that; sees /Dr. Jannifer Franklin" (01/16/2015)    Patient Active Problem List   Diagnosis Date Noted  . Dementia with behavioral disturbance 03/25/2016  . Vascular dementia 04/07/2015  . Abnormality of gait 04/07/2015  . Essential hypertension 02/20/2015  . Aspiration pneumonia ([place]) 01/23/2015  . Fall   . Dysphagia   . Pressure ulcer 01/17/2015  . Rhabdomyolysis 01/16/2015  . Delirium 01/16/2015  . 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 Memory deficits 12/02/2014    Past Surgical History:  Procedure Laterality Date  . ABDOMINAL HERNIA REPAIR  ~ 2003  . CATARACT EXTRACTION W/ INTRAOCULAR LENS  IMPLANT, BILATERAL Bilateral ~ 2004  . EYE SURGERY    . HERNIA REPAIR    . PROSTATECTOMY  ~ 2001  . RETINAL DETACHMENT SURGERY Left ~ 2006  . SKIN CANCER EXCISION  ~ 2013   "back"  . TONSILLECTOMY          Home Medications    Prior to Admission medications   Medication Sig Start Date End Date Taking? Authorizing Provider  acetaminophen (TYLENOL) 500 MG tablet Take 500 mg by mouth every 4 (four) hours as needed for moderate pain.    [provider]  alum & mag hydroxide-simeth ([place]) 200-200-20 MG/5ML suspension Take 30 mLs by mouth every 6 (six) hours as needed for indigestion or heartburn.    [provider]  amLODipine (NORVASC) 5 MG tablet Take 1 tablet (5 mg total) by mouth daily. 01/23/15   Donne Hazel, MD  aspirin EC 81 MG tablet Take 81 mg by mouth daily.    [provider]  divalproex (DEPAKOTE SPRINKLE) 125 MG capsule Take 250 mg by mouth 2 (two) times daily.    [provider]  donepezil (ARICEPT) 10 MG tablet Take 10 mg by mouth daily.     [provider]  escitalopram (LEXAPRO) 10 MG tablet TAKE 1 TABLET BY MOUTH DAILY Patient taking differently: TAKE 10 MG BY MOUTH DAILY 05/04/16   Kathrynn Ducking, MD  gabapentin (NEURONTIN) 100 MG capsule Take 100 mg by mouth 2 (two) times daily.    [provider]  guaifenesin (ROBITUSSIN) 100 MG/5ML syrup Take 200 mg by mouth every 6 (six) hours as needed for cough.  [provider]  loperamide (IMODIUM A-D) 2 MG tablet Take 2 mg by mouth 4 (four) times daily as needed for diarrhea or loose stools.    [provider]  magnesium hydroxide (MILK OF MAGNESIA) 400 MG/5ML suspension Take 30 mLs by mouth at bedtime as needed for mild constipation.    [provider]  memantine (NAMENDA) 10 MG tablet TAKE 1 TABLET (10 MG TOTAL) BY MOUTH 2 (TWO) TIMES DAILY. 04/19/16   Ward Givens, NP  mirtazapine (REMERON) 15 MG tablet Take 15 mg by mouth at bedtime.    [provider]  Multiple Vitamins-Minerals (PRESERVISION AREDS 2 PO) Take 1 capsule by mouth 2 (two) times daily.    [provider]  neomycin-bacitracin-polymyxin (NEOSPORIN)  [phone] ointment Apply 1 application topically daily as needed (skin tear). Clean area with normal saline, apply neosporin and cover with band-air or guaze and tape change as needed until healed.    [provider]  traZODone (DESYREL) 50 MG tablet Take 50 mg by mouth at bedtime as needed for sleep.     [provider]    Family History Family History  Problem Relation Age of Onset  . Stroke Mother   . Lung cancer Brother   . Tuberculosis Father     Social History Social History  Substance Use Topics  . Smoking status: Never Smoker  . Smokeless tobacco: Never Used  . Alcohol use No     Allergies   Penicillins and Ativan [lorazepam]   Review of Systems Review of Systems  Unable to perform ROS: Dementia     Physical Exam Updated Vital Signs BP (!) 148/60   Pulse (!) 55   Temp 98 F (36.7 C) (Oral)   Resp 16   SpO2 97%   Physical Exam  Constitutional: Vital signs are normal. He appears well-developed and well-nourished. No distress.  HENT:  Head: Normocephalic and atraumatic. Head is without raccoon's eyes and without Battle's sign.  Right Ear: No hemotympanum.  Left Ear: No hemotympanum.  Nose: Nose normal.  Mouth/Throat: Uvula is midline, oropharynx is clear and moist and mucous membranes are normal.  Eyes: Pupils are equal, round, and reactive to light. EOM are normal.  Neck: Trachea normal and normal range of motion. Neck supple. No spinous process tenderness and no muscular tenderness present. No tracheal deviation and normal range of motion present.  Cardiovascular: Normal rate, regular rhythm, S1 normal, S2 normal, normal heart sounds, intact distal pulses and normal pulses.   Pulmonary/Chest: Effort normal and breath sounds normal. No respiratory distress. He has no decreased breath sounds. He has no wheezes. He has no rhonchi. He has no rales.  Abdominal: Normal appearance and bowel sounds are normal. There is no tenderness. There is no  rigidity and no guarding.  Musculoskeletal: Normal range of motion.  Neurological: He is alert. He has normal strength. He is disoriented (baseline). No cranial nerve deficit or sensory deficit.  Skin: Skin is warm and dry.  Psychiatric: He has a normal mood and affect. His speech is normal and behavior is normal.  Nursing note and vitals reviewed.  ED Treatments / Results  Labs (all labs ordered are listed, but only abnormal results are displayed) Labs Reviewed - No data to display  EKG  EKG Interpretation None       Radiology Ct Head Wo Contrast  Result Date: 12/22/2016 CLINICAL DATA:  83 y/o  M; witnessed fall.  Injury to back head. EXAM: CT HEAD WITHOUT CONTRAST CT  CERVICAL SPINE WITHOUT CONTRAST TECHNIQUE: Multidetector CT imaging of the head and cervical spine was performed following the standard protocol without intravenous contrast. Multiplanar CT image reconstructions of the cervical spine were also generated. COMPARISON:  09/10/2016 CT of head and cervical spine. FINDINGS: CT HEAD FINDINGS Brain: No evidence of acute infarction, hemorrhage, hydrocephalus, extra-axial collection or mass lesion/mass effect. Advanced chronic microvascular ischemic changes and parenchymal volume loss of the brain are stable. Vascular: Calcific atherosclerosis of carotid siphons. No hyperdense vessel. Skull: Normal. Negative for fracture or focal lesion. Sinuses/Orbits: No acute finding. Other: Left ocular banding and bilateral intra-ocular lens replacement. CT CERVICAL SPINE FINDINGS Alignment: Exaggeration of cervical lordosis and mild rightward curvature. Stable C7-T1 grade 1 anterolisthesis. Skull base and vertebrae: No acute fracture. No primary bone lesion or focal pathologic process. Soft tissues and spinal canal: No prevertebral fluid or swelling. No visible canal hematoma. Disc levels: Advanced cervical degenerative changes with discogenic degenerative disease greatest at the C5 through C7 levels  and left-greater-than-right facet arthropathy. Upper chest: Negative. Other: Calcific atherosclerosis of the aortic arch. Dense calcific atherosclerosis of the carotid bifurcations. 9 mm nodule within the left lobe of thyroid. IMPRESSION: 1. No acute intracranial abnormality or displaced calvarial fracture. 2. Stable advanced chronic microvascular ischemic changes and parenchymal volume loss of the brain. 3. No acute fracture or dislocation of the cervical spine. 4. Stable grade 1 C7-T1 anterolisthesis. Stable advanced cervical spine degenerative changes. Electronically Signed   By: Kristine Garbe M.D.   On: 12/22/2016 19:53   Ct Cervical Spine Wo Contrast  Result Date: 12/22/2016 CLINICAL DATA:  83 y/o  M; witnessed fall.  Injury to back head. EXAM: CT HEAD WITHOUT CONTRAST CT CERVICAL SPINE WITHOUT CONTRAST TECHNIQUE: Multidetector CT imaging of the head and cervical spine was performed following the standard protocol without intravenous contrast. Multiplanar CT image reconstructions of the cervical spine were also generated. COMPARISON:  09/10/2016 CT of head and cervical spine. FINDINGS: CT HEAD FINDINGS Brain: No evidence of acute infarction, hemorrhage, hydrocephalus, extra-axial collection or mass lesion/mass effect. Advanced chronic microvascular ischemic changes and parenchymal volume loss of the brain are stable. Vascular: Calcific atherosclerosis of carotid siphons. No hyperdense vessel. Skull: Normal. Negative for fracture or focal lesion. Sinuses/Orbits: No acute finding. Other: Left ocular banding and bilateral intra-ocular lens replacement. CT CERVICAL SPINE FINDINGS Alignment: Exaggeration of cervical lordosis and mild rightward curvature. Stable C7-T1 grade 1 anterolisthesis. Skull base and vertebrae: No acute fracture. No primary bone lesion or focal pathologic process. Soft tissues and spinal canal: No prevertebral fluid or swelling. No visible canal hematoma. Disc levels: Advanced  cervical degenerative changes with discogenic degenerative disease greatest at the C5 through C7 levels and left-greater-than-right facet arthropathy. Upper chest: Negative. Other: Calcific atherosclerosis of the aortic arch. Dense calcific atherosclerosis of the carotid bifurcations. 9 mm nodule within the left lobe of thyroid. IMPRESSION: 1. No acute intracranial abnormality or displaced calvarial fracture. 2. Stable advanced chronic microvascular ischemic changes and parenchymal volume loss of the brain. 3. No acute fracture or dislocation of the cervical spine. 4. Stable grade 1 C7-T1 anterolisthesis. Stable advanced cervical spine degenerative changes. Electronically Signed   By: Kristine Garbe M.D.   On: 12/22/2016 19:53    Procedures Procedures (including critical care time)  Medications Ordered in ED Medications - No data to display   Initial Impression / Assessment and Plan / ED Course  I have reviewed the triage vital signs and the nursing notes.  Pertinent labs & imaging results  that were available during my care of the patient were reviewed by me and considered in my medical decision making (see chart for details).  Final Clinical Impressions(s) / ED Diagnoses   {I have reviewed and evaluated the relevant imaging studies.  {I have reviewed the relevant previous healthcare records.  {I obtained HPI from historian. {Patient discussed with supervising physician.  ED Course:  Assessment: Pt is a 83 y.o. male presents to the Emergency Department today from EMS due to witnessed mechanical fall from [facility]. Pt ambulated without walker, but with the assistance of a tech. [place] backwards and struck the back of his head. No LOC. No obvious injuries noted. Pt takes 81mg  ASA. No blood thinners. Moving x 4 extremities. No emesis noted. On exam, pt in NAD. Nontoxic/nonseptic appearing. VSS. Afebrile. Lungs CTA. Heart RRR. Abdomen nontender soft. No obvious deformities noted on  skull. No lacerations/abrasions. CT Head/Neck unremarkable. Seen by attending physician. Plan is to [place] with follow up to PCP. At time of discharge, Patient is in no acute distress. Vital Signs are stable. Patient is able to ambulate. Patient able to tolerate PO.    Disposition/Plan:  DC Home Additional Verbal discharge instructions given and discussed with patient.  Pt Instructed to f/u with PCP in the next week for evaluation and treatment of symptoms. Return precautions given Pt acknowledges and agrees with plan  Supervising Physician Mabe, Forbes Cellar, MD  Final diagnoses:  Fall, initial encounter  Minor head injury, initial encounter    New Prescriptions New Prescriptions   No medications on file     Shary Decamp, Hershal Coria 12/22/16 1958    Pixie Casino, MD 12/22/16 [id number]

## 2016-12-22 NOTE — ED Notes (Signed)
Patient waiting on PTAR. 

## 2016-12-22 NOTE — ED Notes (Signed)
Bed: LM76 Expected date:  Expected time:  Means of arrival:  Comments: EMS fall, dementia

## 2016-12-22 NOTE — ED Notes (Signed)
Patient transported to CT 

## 2017-01-23 ENCOUNTER — Emergency Department (HOSPITAL_COMMUNITY): Payer: Medicare Other

## 2017-01-23 ENCOUNTER — Emergency Department (HOSPITAL_COMMUNITY)
Admission: EM | Admit: 2017-01-23 | Discharge: 2017-01-23 | Disposition: A | Discharge status: Home / Self Care | Payer: Medicare Other | Source: Home / Self Care | Attending: Emergency Medicine | Admitting: Emergency Medicine

## 2017-01-23 ENCOUNTER — Encounter (HOSPITAL_COMMUNITY): Payer: Self-pay

## 2017-01-23 ENCOUNTER — Inpatient Hospital Stay (HOSPITAL_COMMUNITY)
Admission: EM | Admit: 2017-01-23 | Discharge: 2017-01-29 | DRG: 469 | Disposition: A | Discharge status: Skilled Nursing Facility | Payer: Medicare Other | Attending: Internal Medicine | Admitting: Internal Medicine

## 2017-01-23 DIAGNOSIS — Z85828 Personal history of other malignant neoplasm of skin: Secondary | ICD-10-CM | POA: Diagnosis not present

## 2017-01-23 DIAGNOSIS — Z9841 Cataract extraction status, right eye: Secondary | ICD-10-CM | POA: Diagnosis not present

## 2017-01-23 DIAGNOSIS — D62 Acute posthemorrhagic anemia: Secondary | ICD-10-CM | POA: Diagnosis not present

## 2017-01-23 DIAGNOSIS — Z9842 Cataract extraction status, left eye: Secondary | ICD-10-CM

## 2017-01-23 DIAGNOSIS — R1310 Dysphagia, unspecified: Secondary | ICD-10-CM

## 2017-01-23 DIAGNOSIS — Z9079 Acquired absence of other genital organ(s): Secondary | ICD-10-CM | POA: Diagnosis not present

## 2017-01-23 DIAGNOSIS — I959 Hypotension, unspecified: Secondary | ICD-10-CM | POA: Diagnosis not present

## 2017-01-23 DIAGNOSIS — I639 Cerebral infarction, unspecified: Secondary | ICD-10-CM | POA: Diagnosis not present

## 2017-01-23 DIAGNOSIS — H5461 Unqualified visual loss, right eye, normal vision left eye: Secondary | ICD-10-CM | POA: Diagnosis present

## 2017-01-23 DIAGNOSIS — I69398 Other sequelae of cerebral infarction: Secondary | ICD-10-CM

## 2017-01-23 DIAGNOSIS — G309 Alzheimer's disease, unspecified: Secondary | ICD-10-CM | POA: Diagnosis not present

## 2017-01-23 DIAGNOSIS — Z9889 Other specified postprocedural states: Secondary | ICD-10-CM

## 2017-01-23 DIAGNOSIS — W1830XA Fall on same level, unspecified, initial encounter: Secondary | ICD-10-CM | POA: Diagnosis present

## 2017-01-23 DIAGNOSIS — Z7982 Long term (current) use of aspirin: Secondary | ICD-10-CM

## 2017-01-23 DIAGNOSIS — F329 Major depressive disorder, single episode, unspecified: Secondary | ICD-10-CM | POA: Diagnosis present

## 2017-01-23 DIAGNOSIS — F0151 Vascular dementia with behavioral disturbance: Secondary | ICD-10-CM | POA: Diagnosis present

## 2017-01-23 DIAGNOSIS — F0281 Dementia in other diseases classified elsewhere with behavioral disturbance: Secondary | ICD-10-CM | POA: Diagnosis present

## 2017-01-23 DIAGNOSIS — R5081 Fever presenting with conditions classified elsewhere: Secondary | ICD-10-CM | POA: Diagnosis not present

## 2017-01-23 DIAGNOSIS — Z801 Family history of malignant neoplasm of trachea, bronchus and lung: Secondary | ICD-10-CM | POA: Diagnosis not present

## 2017-01-23 DIAGNOSIS — R296 Repeated falls: Secondary | ICD-10-CM | POA: Diagnosis present

## 2017-01-23 DIAGNOSIS — Y92129 Unspecified place in nursing home as the place of occurrence of the external cause: Secondary | ICD-10-CM

## 2017-01-23 DIAGNOSIS — W19XXXA Unspecified fall, initial encounter: Secondary | ICD-10-CM

## 2017-01-23 DIAGNOSIS — Z888 Allergy status to other drugs, medicaments and biological substances status: Secondary | ICD-10-CM

## 2017-01-23 DIAGNOSIS — F0150 Vascular dementia without behavioral disturbance: Secondary | ICD-10-CM | POA: Diagnosis present

## 2017-01-23 DIAGNOSIS — J189 Pneumonia, unspecified organism: Secondary | ICD-10-CM | POA: Diagnosis not present

## 2017-01-23 DIAGNOSIS — Z8546 Personal history of malignant neoplasm of prostate: Secondary | ICD-10-CM | POA: Diagnosis not present

## 2017-01-23 DIAGNOSIS — S72001A Fracture of unspecified part of neck of right femur, initial encounter for closed fracture: Secondary | ICD-10-CM | POA: Diagnosis not present

## 2017-01-23 DIAGNOSIS — M62561 Muscle wasting and atrophy, not elsewhere classified, right lower leg: Secondary | ICD-10-CM | POA: Insufficient documentation

## 2017-01-23 DIAGNOSIS — Y9302 Activity, running: Secondary | ICD-10-CM | POA: Diagnosis present

## 2017-01-23 DIAGNOSIS — Z961 Presence of intraocular lens: Secondary | ICD-10-CM | POA: Diagnosis present

## 2017-01-23 DIAGNOSIS — F0391 Unspecified dementia with behavioral disturbance: Secondary | ICD-10-CM | POA: Diagnosis present

## 2017-01-23 DIAGNOSIS — Z880 Allergy status to penicillin: Secondary | ICD-10-CM | POA: Diagnosis not present

## 2017-01-23 DIAGNOSIS — S72001G Fracture of unspecified part of neck of right femur, subsequent encounter for closed fracture with delayed healing: Secondary | ICD-10-CM | POA: Diagnosis not present

## 2017-01-23 DIAGNOSIS — R269 Unspecified abnormalities of gait and mobility: Secondary | ICD-10-CM | POA: Diagnosis present

## 2017-01-23 DIAGNOSIS — R509 Fever, unspecified: Secondary | ICD-10-CM

## 2017-01-23 DIAGNOSIS — I10 Essential (primary) hypertension: Secondary | ICD-10-CM | POA: Diagnosis present

## 2017-01-23 DIAGNOSIS — F03918 Unspecified dementia, unspecified severity, with other behavioral disturbance: Secondary | ICD-10-CM | POA: Diagnosis present

## 2017-01-23 DIAGNOSIS — Z823 Family history of stroke: Secondary | ICD-10-CM

## 2017-01-23 DIAGNOSIS — Z79899 Other long term (current) drug therapy: Secondary | ICD-10-CM | POA: Insufficient documentation

## 2017-01-23 DIAGNOSIS — Y95 Nosocomial condition: Secondary | ICD-10-CM | POA: Diagnosis not present

## 2017-01-23 DIAGNOSIS — F32A Depression, unspecified: Secondary | ICD-10-CM | POA: Diagnosis present

## 2017-01-23 LAB — COMPREHENSIVE METABOLIC PANEL
ALBUMIN: 3.9 g/dL (ref 3.5–5.0)
ALK PHOS: 161 U/L — AB (ref 38–126)
ALT: 17 U/L (ref 17–63)
AST: 53 U/L — ABNORMAL HIGH (ref 15–41)
Anion gap: 10 (ref 5–15)
BILIRUBIN TOTAL: 1.5 mg/dL — AB (ref 0.3–1.2)
BUN: 13 mg/dL (ref 6–20)
CHLORIDE: 102 mmol/L (ref 101–111)
CO2: 28 mmol/L (ref 22–32)
CREATININE: 0.95 mg/dL (ref 0.61–1.24)
Calcium: 9.1 mg/dL (ref 8.9–10.3)
GFR calc Af Amer: >60 mL/min (ref >60)
GFR calc non Af Amer: >60 mL/min (ref >60)
GLUCOSE: 99 mg/dL (ref 65–99)
Potassium: 5.6 mmol/L — ABNORMAL HIGH (ref 3.5–5.1)
SODIUM: 140 mmol/L (ref 135–145)
Total Protein: 7.7 g/dL (ref 6.5–8.1)

## 2017-01-23 LAB — I-STAT CHEM 8, ED
BUN: 12 mg/dL (ref 6–20)
CREATININE: 0.8 mg/dL (ref 0.61–1.24)
Calcium, Ion: 1.16 mmol/L (ref 1.15–1.40)
Chloride: 101 mmol/L (ref 101–111)
GLUCOSE: 87 mg/dL (ref 65–99)
HEMATOCRIT: 36 % — AB (ref 39.0–52.0)
HEMOGLOBIN: 12.2 g/dL — AB (ref 13.0–17.0)
Potassium: 3.6 mmol/L (ref 3.5–5.1)
Sodium: 143 mmol/L (ref 135–145)
TCO2: 29 mmol/L (ref 22–32)

## 2017-01-23 LAB — CBC WITH DIFFERENTIAL/PLATELET
Basophils Absolute: 0 10*3/uL (ref 0.0–0.1)
Basophils Relative: 0 %
Eosinophils Absolute: 0.5 10*3/uL (ref 0.0–0.7)
Eosinophils Relative: 8 %
HCT: 38.6 % — ABNORMAL LOW (ref 39.0–52.0)
Hemoglobin: 13.1 g/dL (ref 13.0–17.0)
Lymphocytes Relative: 20 %
Lymphs Abs: 1.3 10*3/uL (ref 0.7–4.0)
MCH: 34.6 pg — ABNORMAL HIGH (ref 26.0–34.0)
MCHC: 33.9 g/dL (ref 30.0–36.0)
MCV: 101.8 fL — ABNORMAL HIGH (ref 78.0–100.0)
Monocytes Absolute: 0.8 10*3/uL (ref 0.1–1.0)
Monocytes Relative: 12 %
Neutro Abs: 3.9 10*3/uL (ref 1.7–7.7)
Neutrophils Relative %: 60 %
Platelets: 246 10*3/uL (ref 150–400)
RBC: 3.79 MIL/uL — ABNORMAL LOW (ref 4.22–5.81)
RDW: 14.8 % (ref 11.5–15.5)
WBC: 6.6 10*3/uL (ref 4.0–10.5)

## 2017-01-23 LAB — URINALYSIS, ROUTINE W REFLEX MICROSCOPIC
Bilirubin Urine: NEGATIVE
Glucose, UA: NEGATIVE mg/dL
Hgb urine dipstick: NEGATIVE
Ketones, ur: NEGATIVE mg/dL
Leukocytes, UA: NEGATIVE
Nitrite: NEGATIVE
Protein, ur: NEGATIVE mg/dL
Specific Gravity, Urine: 1.009 (ref 1.005–1.030)
pH: 8 (ref 5.0–8.0)

## 2017-01-23 LAB — CBG MONITORING, ED: Glucose-Capillary: 72 mg/dL (ref 65–99)

## 2017-01-23 MED ORDER — DONEPEZIL HCL 10 MG PO TABS
10.0000 mg | ORAL_TABLET | Freq: Every day | ORAL | Status: DC
  Administered 2017-01-25 – 2017-01-29 (×5): 10 mg via ORAL
  Filled 2017-01-23 (×3): qty 1
  Filled 2017-01-23: qty 2
  Filled 2017-01-23: qty 1

## 2017-01-23 MED ORDER — GUAIFENESIN 100 MG/5ML PO SOLN: 200.0000 mg | Freq: Four times a day (QID) | ORAL | Status: DC | PRN

## 2017-01-23 MED ORDER — SENNOSIDES-DOCUSATE SODIUM 8.6-50 MG PO TABS: 1.0000 | ORAL_TABLET | Freq: Every evening | ORAL | Status: DC | PRN

## 2017-01-23 MED ORDER — ACETAMINOPHEN 500 MG PO TABS
500.0000 mg | ORAL_TABLET | ORAL | Status: DC | PRN
  Administered 2017-01-24 – 2017-01-29 (×5): 500 mg via ORAL
  Filled 2017-01-23 (×5): qty 1

## 2017-01-23 MED ORDER — TRIPLE ANTIBIOTIC 3.5-400-5000 EX OINT: 1.0000 "application " | TOPICAL_OINTMENT | Freq: Every day | CUTANEOUS | Status: DC | PRN

## 2017-01-23 MED ORDER — TRAZODONE HCL 50 MG PO TABS: 25.0000 mg | ORAL_TABLET | Freq: Every evening | ORAL | Status: DC | PRN

## 2017-01-23 MED ORDER — SODIUM CHLORIDE 0.9 % IV SOLN
INTRAVENOUS | Status: DC
  Administered 2017-01-23: 23:00:00 via INTRAVENOUS
  Administered 2017-01-24: 75 mL/h via INTRAVENOUS

## 2017-01-23 MED ORDER — GABAPENTIN 100 MG PO CAPS
100.0000 mg | ORAL_CAPSULE | Freq: Two times a day (BID) | ORAL | Status: DC
  Administered 2017-01-24 – 2017-01-29 (×10): 100 mg via ORAL
  Filled 2017-01-23 (×10): qty 1

## 2017-01-23 MED ORDER — MAGNESIUM HYDROXIDE 400 MG/5ML PO SUSP: 30.0000 mL | Freq: Every evening | ORAL | Status: DC | PRN

## 2017-01-23 MED ORDER — OXYCODONE-ACETAMINOPHEN 5-325 MG PO TABS
1.0000 | ORAL_TABLET | ORAL | Status: DC | PRN
  Administered 2017-01-24: 1 via ORAL
  Filled 2017-01-23: qty 1

## 2017-01-23 MED ORDER — AMLODIPINE BESYLATE 5 MG PO TABS
5.0000 mg | ORAL_TABLET | Freq: Every day | ORAL | Status: DC
  Administered 2017-01-25 – 2017-01-28 (×3): 5 mg via ORAL
  Filled 2017-01-23 (×4): qty 1

## 2017-01-23 MED ORDER — ALUM & MAG HYDROXIDE-SIMETH 200-200-20 MG/5ML PO SUSP: 30.0000 mL | Freq: Four times a day (QID) | ORAL | Status: DC | PRN

## 2017-01-23 MED ORDER — PROSIGHT PO TABS
1.0000 | ORAL_TABLET | Freq: Every day | ORAL | Status: DC
  Administered 2017-01-25 – 2017-01-29 (×5): 1 via ORAL
  Filled 2017-01-23 (×5): qty 1

## 2017-01-23 MED ORDER — HYDRALAZINE HCL 20 MG/ML IJ SOLN: 5.0000 mg | INTRAMUSCULAR | Status: DC | PRN

## 2017-01-23 MED ORDER — METHOCARBAMOL 500 MG PO TABS: 500.0000 mg | ORAL_TABLET | Freq: Three times a day (TID) | ORAL | Status: DC | PRN

## 2017-01-23 MED ORDER — LUBRIDERM SERIOUSLY SENSITIVE EX LOTN
1.0000 "application " | TOPICAL_LOTION | Freq: Every day | CUTANEOUS | Status: DC
  Administered 2017-01-25 – 2017-01-29 (×4): 1 via TOPICAL
  Filled 2017-01-23: qty 473

## 2017-01-23 MED ORDER — MEMANTINE HCL 10 MG PO TABS
10.0000 mg | ORAL_TABLET | Freq: Two times a day (BID) | ORAL | Status: DC
  Administered 2017-01-24 – 2017-01-29 (×10): 10 mg via ORAL
  Filled 2017-01-23 (×10): qty 1

## 2017-01-23 MED ORDER — MORPHINE SULFATE (PF) 2 MG/ML IV SOLN
1.0000 mg | INTRAVENOUS | Status: DC | PRN
  Administered 2017-01-24: 1 mg via INTRAVENOUS

## 2017-01-23 MED ORDER — LOPERAMIDE HCL 2 MG PO CAPS
2.0000 mg | ORAL_CAPSULE | Freq: Four times a day (QID) | ORAL | Status: DC | PRN
  Filled 2017-01-23: qty 1

## 2017-01-23 MED ORDER — ESCITALOPRAM OXALATE 10 MG PO TABS
10.0000 mg | ORAL_TABLET | Freq: Every day | ORAL | Status: DC
  Administered 2017-01-25 – 2017-01-29 (×5): 10 mg via ORAL
  Filled 2017-01-23 (×5): qty 1

## 2017-01-23 MED ORDER — ONDANSETRON HCL 4 MG/2ML IJ SOLN: 4.0000 mg | Freq: Three times a day (TID) | INTRAMUSCULAR | Status: DC | PRN

## 2017-01-23 MED ORDER — MIRTAZAPINE 15 MG PO TABS
15.0000 mg | ORAL_TABLET | Freq: Every day | ORAL | Status: DC
  Administered 2017-01-24 – 2017-01-28 (×5): 15 mg via ORAL
  Filled 2017-01-23 (×5): qty 1

## 2017-01-23 MED ORDER — DIVALPROEX SODIUM 250 MG PO DR TAB
250.0000 mg | DELAYED_RELEASE_TABLET | Freq: Two times a day (BID) | ORAL | Status: DC
  Administered 2017-01-24 – 2017-01-29 (×11): 250 mg via ORAL
  Filled 2017-01-23 (×10): qty 1

## 2017-01-23 NOTE — H&P (Signed)
History and Physical    Jimmy Solis DTO:[id number] DOB: Sep 16, 1928 DOA: 01/23/2017  Referring MD/NP/PA:   PCP: Cyndy Freeze, MD   Patient coming from:  The patient is coming from SNF.  At baseline, pt is dependent for most of ADL.    Chief Complaint: fall and right hip pain  HPI: Jimmy Solis is a 83 y.o. male with medical history significant of dementia with behavioral disturbance, hypertension, stroke, prostate cancer, depression, central retinal artery occlusion, right eye blindness, abnormal gait, who presents with fall and right hip pain.  Patient has hx of dementia, and is unable to provide medical history, therefore, most of the history is obtained by discussing the case with ED physician, per EMS report, and with the nursing staff. Per report, pt fell twice in SNF. Pt was seen and discharged from ED this morning after being evaluated for fall. Staff reports that the patient got up, took off, running out of his wheelchair and fell after about 10-15 feet. Reportedly, patient did not lose consciousness. He seems to have right hip pain. No active respiratory distress, cough, nausea, vomiting or diarrhea noted. He moves all extremities normally.  ED Course: pt was found to have WBC 6.6, negative urinalysis, electrolytes renal function okay, temperature normal, no tachycardia, O2 sat100% on room air. Patient is admitted to [place] bed as inpatient. Orthopedic surgeon, Dr. Percell Miller was consulted.  # x-ray of right hip: Deformity of the right proximal femur at the junction of femoral head and neck is consistent with fracture. # CT of the head and C-spine: negative for acute abnormalities. # x-ray of right knee and the right tibia/fibular: negative # CXR: Chronic elevation of the right hemidiaphragm. Small right pleural effusion with mild subjacent atelectasis.  Review of Systems: Could not be reviewed due to dementia.  Allergy:  Allergies  Allergen Reactions  . Penicillins Hives  and Other (See Comments)    Has patient had a PCN reaction causing immediate rash, facial/tongue/throat swelling, SOB or lightheadedness with hypotension: Unsure Has patient had a PCN reaction causing severe rash involving mucus membranes or skin necrosis: Unsure Has patient had a PCN reaction that required hospitalization Unsure Has patient had a PCN reaction occurring within the last 10 years: Unsure If all of the above answers are "NO", then may proceed with Cephalosporin use.  . Ativan [Lorazepam] Other (See Comments)    Reaction:  Hallucinations     Past Medical History:  Diagnosis Date  . Abnormality of gait 04/07/2015  . Alzheimers disease   . Blind right eye   . Cancer of skin of back   . 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 Depression    hx  . Falls frequently    "probably once/month" (01/16/2015)  . Prostate cancer ([place])   . Retinal detachment    left  . Stroke [facility]) ~ 12/2014   "lost vision in right eye"  . TIA (transient ischemic attack)    "several over the last couple years; brain scans have reflected that; sees /Dr. Jannifer Franklin" (01/16/2015)    Past Surgical History:  Procedure Laterality Date  . ABDOMINAL HERNIA REPAIR  ~ 2003  . CATARACT EXTRACTION W/ INTRAOCULAR LENS  IMPLANT, BILATERAL Bilateral ~ 2004  . EYE SURGERY    . HERNIA REPAIR    . PROSTATECTOMY  ~ 2001  . RETINAL DETACHMENT SURGERY Left ~ 2006  . SKIN CANCER EXCISION  ~ 2013   "back"  . TONSILLECTOMY  Social History:  reports that he has never smoked. He has never used smokeless tobacco. He reports that he does not drink alcohol or use drugs.  Family History:  Family History  Problem Relation Age of Onset  . Stroke Mother   . Lung cancer Brother   . Tuberculosis Father      Prior to Admission medications   Medication Sig Start Date End Date Taking? Authorizing Provider  acetaminophen (TYLENOL) 500 MG tablet Take 500 mg by mouth every 4 (four) hours as needed for moderate  pain.    [provider]  alum & mag hydroxide-simeth ([place]) 200-200-20 MG/5ML suspension Take 30 mLs by mouth every 6 (six) hours as needed for indigestion or heartburn.    [provider]  amLODipine (NORVASC) 5 MG tablet Take 1 tablet (5 mg total) by mouth daily. 01/23/15   Donne Hazel, MD  aspirin EC 81 MG tablet Take 81 mg by mouth daily.    [provider]  divalproex (DEPAKOTE) 250 MG DR tablet Take 250 mg by mouth 2 (two) times daily.    [provider]  donepezil (ARICEPT) 10 MG tablet Take 10 mg by mouth daily.     [provider]  Emollient (LUBRIDERM SERIOUSLY SENSITIVE) LOTN Apply 1 application topically daily.    [provider]  escitalopram (LEXAPRO) 10 MG tablet TAKE 1 TABLET BY MOUTH DAILY Patient taking differently: TAKE 10 MG BY MOUTH DAILY 05/04/16   Kathrynn Ducking, MD  gabapentin (NEURONTIN) 100 MG capsule Take 100 mg by mouth 2 (two) times daily.    [provider]  guaifenesin (ROBITUSSIN) 100 MG/5ML syrup Take 200 mg by mouth every 6 (six) hours as needed for cough.    [provider]  loperamide (IMODIUM A-D) 2 MG tablet Take 2 mg by mouth 4 (four) times daily as needed for diarrhea or loose stools.    [provider]  magnesium hydroxide (MILK OF MAGNESIA) 400 MG/5ML suspension Take 30 mLs by mouth at bedtime as needed for mild constipation.    [provider]  memantine (NAMENDA) 10 MG tablet TAKE 1 TABLET (10 MG TOTAL) BY MOUTH 2 (TWO) TIMES DAILY. 04/19/16   Ward Givens, NP  mirtazapine (REMERON) 15 MG tablet Take 15 mg by mouth at bedtime.    [provider]  Multiple Vitamins-Minerals (PRESERVISION AREDS 2 PO) Take 1 capsule by mouth 2 (two) times daily.    [provider]  neomycin-bacitracin-polymyxin (NEOSPORIN) [phone] ointment Apply 1 application topically daily as needed (skin tear). Clean area with normal saline, apply neosporin and cover  with band-air or guaze and tape change as needed until healed.    [provider]  traZODone (DESYREL) 50 MG tablet Take 50 mg by mouth at bedtime as needed for sleep.     [provider]  traZODone (DESYREL) 50 MG tablet Take 25 mg by mouth at bedtime as needed (agitation).    [provider]    Physical Exam: Vitals:   01/24/17 0030 01/24/17 0045 01/24/17 0111 01/24/17 0205  BP:   (!) 164/90 (!) 160/66  Pulse: 66 65 88 75  Resp:   17 (!) 21  Temp:    97.9 F (36.6 C)  TempSrc:    Axillary  SpO2: 99% 95% 96% 98%   General: Not in acute distress HEENT:       Eyes: PERRL, EOMI, no scleral icterus.       ENT: No discharge from the ears  and nose, no pharynx injection, no tonsillar enlargement.        Neck: No JVD, no bruit, no mass felt. Heme: No neck lymph node enlargement. Cardiac: S1/S2, RRR, No murmurs, No gallops or rubs. Respiratory: No rales, wheezing, rhonchi or rubs. GI: Soft, nondistended,  no organomegaly, BS present. GU: No hematuria Ext: No pitting leg edema bilaterally. 2+DP/PT pulse bilaterally. Musculoskeletal: tenderness in right anterior hip, right knee and shin Skin: No rashes.  Neuro: not oriented X3, cranial nerves II-XII grossly intact, moves all extremities  Psych: Patient is not psychotic.  Labs on Admission: I have personally reviewed following labs and imaging studies  CBC:  Recent Labs Lab 01/23/17 1124 01/23/17 2001  WBC  --  6.6  NEUTROABS  --  3.9  HGB 12.2* 13.1  HCT 36.0* 38.6*  MCV  --  101.8*  PLT  --  992   Basic Metabolic Panel:  Recent Labs Lab 01/23/17 1124 01/23/17 2133  NA 143 140  K 3.6 5.6*  CL 101 102  CO2  --  28  GLUCOSE 87 99  BUN 12 13  CREATININE 0.80 0.95  CALCIUM  --  9.1   GFR: Estimated Creatinine Clearance: 54 mL/min (by C-G formula based on SCr of 0.95 mg/dL). Liver Function Tests:  Recent Labs Lab 01/23/17 2133  AST 53*  ALT 17  ALKPHOS 161*  BILITOT 1.5*  PROT 7.7    ALBUMIN 3.9   No results for input(s): LIPASE, AMYLASE in the last 168 hours. No results for input(s): AMMONIA in the last 168 hours. Coagulation Profile:  Recent Labs Lab 01/23/17 2348  INR 0.96   Cardiac Enzymes: No results for input(s): CKTOTAL, CKMB, CKMBINDEX, TROPONINI in the last 168 hours. BNP (last 3 results) No results for input(s): PROBNP in the last 8760 hours. HbA1C: No results for input(s): HGBA1C in the last 72 hours. CBG:  Recent Labs Lab 01/23/17 0948  GLUCAP 72   Lipid Profile: No results for input(s): CHOL, HDL, LDLCALC, TRIG, CHOLHDL, LDLDIRECT in the last 72 hours. Thyroid Function Tests: No results for input(s): TSH, T4TOTAL, FREET4, T3FREE, THYROIDAB in the last 72 hours. Anemia Panel: No results for input(s): VITAMINB12, FOLATE, FERRITIN, TIBC, IRON, RETICCTPCT in the last 72 hours. Urine analysis:    Component Value Date/Time   COLORURINE STRAW (A) 01/23/2017 2001   APPEARANCEUR CLEAR 01/23/2017 2001   LABSPEC 1.009 01/23/2017 2001   PHURINE 8.0 01/23/2017 2001   GLUCOSEU NEGATIVE 01/23/2017 2001   HGBUR NEGATIVE 01/23/2017 2001   [place] NEGATIVE 01/23/2017 2001   [place] NEGATIVE 01/23/2017 2001   PROTEINUR NEGATIVE 01/23/2017 2001   UROBILINOGEN 0.2 01/16/2015 2155   NITRITE NEGATIVE 01/23/2017 2001   LEUKOCYTESUR NEGATIVE 01/23/2017 2001   Sepsis Labs: @LABRCNTIP (procalcitonin:4,lacticidven:4) )No results found for this or any previous visit (from the past 240 hour(s)).   Radiological Exams on Admission: Dg Chest 1 View  Result Date: 01/23/2017 CLINICAL DATA:  Status post fall. EXAM: CHEST 1 VIEW COMPARISON:  May 01, 2016 FINDINGS: The mediastinal contour is normal. The heart size is mildly enlarged. There is chronic elevation of right hemidiaphragm. There is a small right pleural effusion with mild subjacent atelectasis. The left lung is clear. The bony structures are stable. IMPRESSION: Chronic elevation of the right  hemidiaphragm. Small right pleural effusion with mild subjacent atelectasis. Electronically Signed   By: Abelardo Diesel M.D.   On: 01/23/2017 19:59   Dg Tibia/fibula Right  Result Date: 01/23/2017 CLINICAL DATA:  Leg pain, fall  from wheelchair EXAM: RIGHT TIBIA AND FIBULA - 2 VIEW COMPARISON:  None. FINDINGS: The study is limited by patient mobility and positioning. No definite acute displaced fracture or malalignment is seen. There are vascular calcifications. Sock artifact over the foot. IMPRESSION: No definite acute osseous abnormality. Electronically Signed   By: Donavan Foil M.D.   On: 01/23/2017 21:05   Ct Head Wo Contrast  Result Date: 01/23/2017 CLINICAL DATA:  Patient BIB EMS from [facility]. Staff reported the patient "got up and took off running out of his wheelchair, and fell after about 10-31ft." Staff reported patient did not hit his head or lose consciousness. Staff reports patient is not on blood thinners. Patient has history of dementia. Patient c/o pain to right leg. Shortening and rotation noted to right lower extremity. Patient is high fall risk and was discharged from [facility] last night for another fall. EXAM: CT HEAD WITHOUT CONTRAST CT CERVICAL SPINE WITHOUT CONTRAST TECHNIQUE: Multidetector CT imaging of the head and cervical spine was performed following the standard protocol without intravenous contrast. Multiplanar CT image reconstructions of the cervical spine were also generated. COMPARISON:  12/22/2016 FINDINGS: CT HEAD FINDINGS Brain: No evidence of acute infarction, hemorrhage, hydrocephalus, extra-axial collection or mass lesion/mass effect. Moderate atrophy and extensive white matter hypoattenuation consistent with advanced chronic microvascular ischemic change, stable from previous day's study. Vascular: No hyperdense vessel or unexpected calcification. Skull: Normal. Negative for fracture or focal lesion. Sinuses/Orbits: No acute abnormality of the globes and orbits.  Sinuses and mastoid air cells are clear. Other: None. CT CERVICAL SPINE FINDINGS Alignment: Exaggerated lordosis. Mild degenerative anterolisthesis of C7 on T1. Skull base and vertebrae: No acute fracture. No primary bone lesion or focal pathologic process. Soft tissues and spinal canal: No prevertebral fluid or swelling. No visible canal hematoma. Disc levels: Moderate loss of disc height at C3-C4. Mild loss of disc height at C4-C5. Marked loss of disc height at C5-C6, C6-C7 and C7-T1. There is spondylotic disc bulging with endplate spurring at these levels. Bilateral facet degenerative changes are noted from C2-C3 through C7-T1. There are varying degrees of neural foraminal narrowing due to facet and uncovertebral spurring. No convincing disc herniation. Upper chest: No acute findings. Other: None. IMPRESSION: HEAD CT 1. No acute intracranial abnormalities. 2. Atrophy and advanced chronic microvascular ischemic change. Stable appearance from the previous day's study. CERVICAL CT 1. No fracture or acute findings. 2. Advanced degenerative changes stable from the prior cervical CT. Electronically Signed   By: Lajean Manes M.D.   On: 01/23/2017 20:40   Ct Cervical Spine Wo Contrast  Result Date: 01/23/2017 CLINICAL DATA:  Patient BIB EMS from [facility]. Staff reported the patient "got up and took off running out of his wheelchair, and fell after about 10-59ft." Staff reported patient did not hit his head or lose consciousness. Staff reports patient is not on blood thinners. Patient has history of dementia. Patient c/o pain to right leg. Shortening and rotation noted to right lower extremity. Patient is high fall risk and was discharged from [facility] last night for another fall. EXAM: CT HEAD WITHOUT CONTRAST CT CERVICAL SPINE WITHOUT CONTRAST TECHNIQUE: Multidetector CT imaging of the head and cervical spine was performed following the standard protocol without intravenous contrast. Multiplanar CT image  reconstructions of the cervical spine were also generated. COMPARISON:  12/22/2016 FINDINGS: CT HEAD FINDINGS Brain: No evidence of acute infarction, hemorrhage, hydrocephalus, extra-axial collection or mass lesion/mass effect. Moderate atrophy and extensive white matter hypoattenuation consistent with advanced  chronic microvascular ischemic change, stable from previous day's study. Vascular: No hyperdense vessel or unexpected calcification. Skull: Normal. Negative for fracture or focal lesion. Sinuses/Orbits: No acute abnormality of the globes and orbits. Sinuses and mastoid air cells are clear. Other: None. CT CERVICAL SPINE FINDINGS Alignment: Exaggerated lordosis. Mild degenerative anterolisthesis of C7 on T1. Skull base and vertebrae: No acute fracture. No primary bone lesion or focal pathologic process. Soft tissues and spinal canal: No prevertebral fluid or swelling. No visible canal hematoma. Disc levels: Moderate loss of disc height at C3-C4. Mild loss of disc height at C4-C5. Marked loss of disc height at C5-C6, C6-C7 and C7-T1. There is spondylotic disc bulging with endplate spurring at these levels. Bilateral facet degenerative changes are noted from C2-C3 through C7-T1. There are varying degrees of neural foraminal narrowing due to facet and uncovertebral spurring. No convincing disc herniation. Upper chest: No acute findings. Other: None. IMPRESSION: HEAD CT 1. No acute intracranial abnormalities. 2. Atrophy and advanced chronic microvascular ischemic change. Stable appearance from the previous day's study. CERVICAL CT 1. No fracture or acute findings. 2. Advanced degenerative changes stable from the prior cervical CT. Electronically Signed   By: Lajean Manes M.D.   On: 01/23/2017 20:40   Dg Knee Complete 4 Views Right  Result Date: 01/23/2017 CLINICAL DATA:  [address] out of wheelchair with pain to the leg EXAM: RIGHT KNEE - COMPLETE 4+ VIEW COMPARISON:  01/23/2017 FINDINGS: Mild to moderate  patellofemoral degenerative changes. Mild to moderate medial and lateral degenerative changes. No acute displaced fracture. Vascular calcifications. No large effusion. IMPRESSION: Degenerative changes.  No definite acute osseous abnormality. Electronically Signed   By: Donavan Foil M.D.   On: 01/23/2017 21:02   Dg Hip Unilat  With Pelvis 2-3 Views Right  Result Date: 01/23/2017 CLINICAL DATA:  Status post fall. EXAM: DG HIP (WITH OR WITHOUT PELVIS) 2-3V RIGHT COMPARISON:  None. FINDINGS: There is deformity of the right proximal femur at the junction of femoral head and neck is consistent with fracture. Degenerative joint changes of the spine are noted. IMPRESSION: Deformity of the right proximal femur at the junction of femoral head and neck is consistent with fracture. Electronically Signed   By: Abelardo Diesel M.D.   On: 01/23/2017 19:58     EKG: Independently reviewed.  Sinus rhythm, QTC 454, LAD, poor R-wave progression, first degree AV block  Assessment/Plan Principal Problem:   Closed right hip fracture [facility]) Active Problems:   Fall   Essential hypertension   Dementia with behavioral disturbance   Stroke [facility])   Depression   Fall and closed right hip fracture [facility]): CT head and C-spine negative. No neurovascular compromise. Orthopedic surgeon was consulted. Dr. Percell Miller is planning to do surgery tomorrow  - will admit to Med-surg bed - Pain control: morphine prn and percocet - When necessary Zofran for nausea - Robaxin for muscle spasm - type and cross - INR/PTT  -NPO after mn - pt/ot when able to  HTN: -Amlodipine -IV hydralazine when necessary  Dementia with behavioral disturbance: -continue donepezil, Depakote, Namenda  Hx of Stroke [facility]): -hold ASA for surgery   Depression:  Lexapro, and remeron   DVT ppx: SCD Code Status: Full code Family Communication: None at bed side.    Disposition Plan:  Anticipate discharge back to previous SNF Consults called:  Ortho,  Dr. Percell Miller Admission status:  medical floor/obs    Date of Service 01/24/2017    Damyra Luscher, [place] [facility] Pager [phone]  If  7PM-7AM, please contact night-coverage [email or web address] Password [facility] 01/24/2017, 4:33 AM

## 2017-01-23 NOTE — ED Notes (Signed)
Bed: YB33 Expected date:  Expected time:  Means of arrival:  Comments: Hold for [employer]

## 2017-01-23 NOTE — ED Notes (Signed)
Attempted to call [place] [facility] to give report, receiving Nurse unavailable , per charge Nurse Anderson Malta will call back later .

## 2017-01-23 NOTE — ED Notes (Signed)
Bed: [facility] Expected date:  Expected time:  Means of arrival:  Comments: EMS-FALL

## 2017-01-23 NOTE — ED Provider Notes (Signed)
[facility] DEPT Provider Note   CSN: [id number] Arrival date & time: 01/23/17  0930     History   Chief Complaint No chief complaint on file.   HPI Jimmy Solis is a 83 y.o. male.  83 year old male with history of Alzheimer's who presents from nursing home after being found on the floor. He had no apparent injury. Was at his neurological baseline according to EMS. No recent reported history of illness. Was transported here for further evaluation. No further history obtainable due to the patient's current state      Past Medical History:  Diagnosis Date  . Abnormality of gait 04/07/2015  . Alzheimers disease   . Blind right eye   . Cancer of skin of back   . 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 Depression    hx  . Falls frequently    "probably once/month" (01/16/2015)  . Prostate cancer ([place])   . Retinal detachment    left  . Stroke [facility]) ~ 12/2014   "lost vision in right eye"  . TIA (transient ischemic attack)    "several over the last couple years; brain scans have reflected that; sees /Dr. Jannifer Franklin" (01/16/2015)    Patient Active Problem List   Diagnosis Date Noted  . Dementia with behavioral disturbance 03/25/2016  . Vascular dementia 04/07/2015  . Abnormality of gait 04/07/2015  . Essential hypertension 02/20/2015  . Aspiration pneumonia ([place]) 01/23/2015  . Fall   . Dysphagia   . Pressure ulcer 01/17/2015  . Rhabdomyolysis 01/16/2015  . Delirium 01/16/2015  . 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 Memory deficits 12/02/2014    Past Surgical History:  Procedure Laterality Date  . ABDOMINAL HERNIA REPAIR  ~ 2003  . CATARACT EXTRACTION W/ INTRAOCULAR LENS  IMPLANT, BILATERAL Bilateral ~ 2004  . EYE SURGERY    . HERNIA REPAIR    . PROSTATECTOMY  ~ 2001  . RETINAL DETACHMENT SURGERY Left ~ 2006  . SKIN CANCER EXCISION  ~ 2013   "back"  . TONSILLECTOMY         Home Medications    Prior to Admission medications     Medication Sig Start Date End Date Taking? Authorizing Provider  acetaminophen (TYLENOL) 500 MG tablet Take 500 mg by mouth every 4 (four) hours as needed for moderate pain.    [provider]  alclomethasone (ACLOVATE) 0.05 % cream Apply 1 application topically 2 (two) times daily as needed (eczema).    [provider]  alum & mag hydroxide-simeth ([place]) 200-200-20 MG/5ML suspension Take 30 mLs by mouth every 6 (six) hours as needed for indigestion or heartburn.    [provider]  amLODipine (NORVASC) 5 MG tablet Take 1 tablet (5 mg total) by mouth daily. 01/23/15   Donne Hazel, MD  aspirin EC 81 MG tablet Take 81 mg by mouth daily.    [provider]  augmented betamethasone dipropionate (DIPROLENE-AF) 0.05 % cream Apply 1 application topically 2 (two) times daily as needed (eczema).    [provider]  divalproex (DEPAKOTE SPRINKLE) 125 MG capsule Take 250 mg by mouth 2 (two) times daily.    [provider]  donepezil (ARICEPT) 10 MG tablet Take 10 mg by mouth daily.     [provider]  Emollient (LUBRIDERM SERIOUSLY SENSITIVE) LOTN Apply 1 application topically daily.    [provider]  escitalopram (LEXAPRO) 10 MG tablet TAKE 1 TABLET BY MOUTH DAILY Patient taking differently: TAKE 10  MG BY MOUTH DAILY 05/04/16   Kathrynn Ducking, MD  gabapentin (NEURONTIN) 100 MG capsule Take 100 mg by mouth 2 (two) times daily.    [provider]  guaifenesin (ROBITUSSIN) 100 MG/5ML syrup Take 200 mg by mouth every 6 (six) hours as needed for cough.    [provider]  loperamide (IMODIUM A-D) 2 MG tablet Take 2 mg by mouth 4 (four) times daily as needed for diarrhea or loose stools.    [provider]  magnesium hydroxide (MILK OF MAGNESIA) 400 MG/5ML suspension Take 30 mLs by mouth at bedtime as needed for mild constipation.    [provider]  memantine (NAMENDA) 10 MG tablet TAKE 1 TABLET  (10 MG TOTAL) BY MOUTH 2 (TWO) TIMES DAILY. 04/19/16   Ward Givens, NP  mirtazapine (REMERON) 15 MG tablet Take 15 mg by mouth at bedtime.    [provider]  Multiple Vitamins-Minerals (PRESERVISION AREDS 2 PO) Take 1 capsule by mouth 2 (two) times daily.    [provider]  neomycin-bacitracin-polymyxin (NEOSPORIN) [phone] ointment Apply 1 application topically daily as needed (skin tear). Clean area with normal saline, apply neosporin and cover with band-air or guaze and tape change as needed until healed.    [provider]  traZODone (DESYREL) 50 MG tablet Take 50 mg by mouth at bedtime as needed for sleep.     [provider]  traZODone (DESYREL) 50 MG tablet Take 25 mg by mouth at bedtime as needed (agitation).    [provider]    Family History Family History  Problem Relation Age of Onset  . Stroke Mother   . Lung cancer Brother   . Tuberculosis Father     Social History Social History  Substance Use Topics  . Smoking status: Never Smoker  . Smokeless tobacco: Never Used  . Alcohol use No     Allergies   Penicillins and Ativan [lorazepam]   Review of Systems Review of Systems  Unable to perform ROS: Dementia     Physical Exam Updated Vital Signs BP (!) 156/66 (BP Location: Left Arm)   Pulse 69   Temp 97.6 F (36.4 C) (Oral)   Resp 20   Ht 1.727 m (5\' 8" )   Wt 72.6 kg (160 lb)   SpO2 99%   BMI 24.33 kg/m   Physical Exam  Constitutional: He appears well-developed and well-nourished.  Non-toxic appearance. No distress.  HENT:  Head: Normocephalic and atraumatic.  Eyes: Pupils are equal, round, and reactive to light. Conjunctivae, EOM and lids are normal.  Neck: Normal range of motion. Neck supple. No spinous process tenderness present. No tracheal deviation and normal range of motion present. No thyroid mass present.  Cardiovascular: Normal rate, regular rhythm and normal heart sounds.  Exam reveals no  gallop.   No murmur heard. Pulmonary/Chest: Effort normal and breath sounds normal. No stridor. No respiratory distress. He has no decreased breath sounds. He has no wheezes. He has no rhonchi. He has no rales.  Abdominal: Soft. Normal appearance and bowel sounds are normal. He exhibits no distension. There is no tenderness. There is no rebound and no CVA tenderness.  Musculoskeletal: Normal range of motion. He exhibits no edema or tenderness.  Full range of motion at all of his joints without pain. Neurovascularly intact at his distal extremities. No deformities appreciated.  Neurological: He is alert. He displays atrophy. No cranial nerve deficit or sensory deficit. GCS eye subscore is 4. GCS verbal subscore  is 4. GCS motor subscore is 6.  Skin: Skin is warm and dry. No abrasion and no rash noted.  Psychiatric: His affect is blunt. His speech is delayed. He is not agitated. He is inattentive.  Nursing note and vitals reviewed.    ED Treatments / Results  Labs (all labs ordered are listed, but only abnormal results are displayed) Labs Reviewed  CBG MONITORING, ED    EKG  EKG Interpretation None       Radiology No results found.  Procedures Procedures (including critical care time)  Medications Ordered in ED Medications - No data to display   Initial Impression / Assessment and Plan / ED Course  I have reviewed the triage vital signs and the nursing notes.  Pertinent labs & imaging results that were available during my care of the patient were reviewed by me and considered in my medical decision making (see chart for details).     Electrolytes without severe abnormalities. He has no visible signs of trauma. Will not do imaging at this time we'll discharge home  Final Clinical Impressions(s) / ED Diagnoses   Final diagnoses:  None    New Prescriptions New Prescriptions   No medications on file     Lacretia Leigh, MD 01/23/17 1145

## 2017-01-23 NOTE — ED Triage Notes (Signed)
Patient BIB EMS from [facility]. Staff reported the patient "got up and took off running out of his wheelchair, and fell after about 10-14ft." Staff reported patient did not hit his head or lose consciousness. Staff reports patient is not on blood thinners. Patient has history of dementia. Patient c/o pain to right leg. Shortening and rotation noted to right lower extremity.  Patient is high fall risk and was discharged from [facility] last night for another fall.

## 2017-01-23 NOTE — ED Notes (Signed)
Bed: CB76 Expected date:  Expected time:  Means of arrival:  Comments: EMS 83 y/o dementia fall

## 2017-01-23 NOTE — ED Triage Notes (Signed)
Per [facility]- Pt from [facility]. Full Code- no paper work. No living will. Unwitnessed fall. Found on bathroom floor. No trauma noted. Unknown to amount to time on floor. EDP has evaluated pt before triage

## 2017-01-23 NOTE — ED Provider Notes (Signed)
[place] DEPT Provider Note   CSN: [id number] Arrival date & time: 01/23/17  1902     History   Chief Complaint Chief Complaint  Patient presents with  . Fall    HPI Jimmy Solis is a 83 y.o. male with history of Alzheimer's disease and TIA, frequent falls who presents for the second time today from [place] after second fall. Patient was discharged this morning after being evaluated for fall. Staff reports that the patient got up and took off running out of his wheelchair and fell after about 10-15 feet. Reportedly, patient did not hit his head or lose consciousness. He is not on blood thinners. He is complaining of pain to his right leg. Patient denies any other pain, however is inconsistent on my exam.  Power of Attorney: Philipp Ovens [phone]  HPI  Past Medical History:  Diagnosis Date  . Abnormality of gait 04/07/2015  . Alzheimers disease   . Blind right eye   . Cancer of skin of back   . 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 Depression    hx  . Falls frequently    "probably once/month" (01/16/2015)  . Prostate cancer ([place])   . Retinal detachment    left  . Stroke [facility]) ~ 12/2014   "lost vision in right eye"  . TIA (transient ischemic attack)    "several over the last couple years; brain scans have reflected that; sees /Dr. Jannifer Franklin" (01/16/2015)    Patient Active Problem List   Diagnosis Date Noted  . Closed right hip fracture ([place]) 01/23/2017  . Depression 01/23/2017  . Stroke ([place])   . Dementia with behavioral disturbance 03/25/2016  . Vascular dementia 04/07/2015  . Abnormality of gait 04/07/2015  . Essential hypertension 02/20/2015  . Aspiration pneumonia ([place]) 01/23/2015  . Fall   . Dysphagia   . Pressure ulcer 01/17/2015  . Rhabdomyolysis 01/16/2015  . Delirium 01/16/2015  . 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 Memory deficits 12/02/2014    Past Surgical History:  Procedure Laterality Date  .  ABDOMINAL HERNIA REPAIR  ~ 2003  . CATARACT EXTRACTION W/ INTRAOCULAR LENS  IMPLANT, BILATERAL Bilateral ~ 2004  . EYE SURGERY    . HERNIA REPAIR    . PROSTATECTOMY  ~ 2001  . RETINAL DETACHMENT SURGERY Left ~ 2006  . SKIN CANCER EXCISION  ~ 2013   "back"  . TONSILLECTOMY         Home Medications    Prior to Admission medications   Medication Sig Start Date End Date Taking? Authorizing Provider  acetaminophen (TYLENOL) 500 MG tablet Take 500 mg by mouth every 4 (four) hours as needed for moderate pain.    [provider]  alum & mag hydroxide-simeth ([place]) 200-200-20 MG/5ML suspension Take 30 mLs by mouth every 6 (six) hours as needed for indigestion or heartburn.    [provider]  amLODipine (NORVASC) 5 MG tablet Take 1 tablet (5 mg total) by mouth daily. 01/23/15   Donne Hazel, MD  aspirin EC 81 MG tablet Take 81 mg by mouth daily.    [provider]  divalproex (DEPAKOTE) 250 MG DR tablet Take 250 mg by mouth 2 (two) times daily.    [provider]  donepezil (ARICEPT) 10 MG tablet Take 10 mg by mouth daily.     [provider]  Emollient (LUBRIDERM SERIOUSLY SENSITIVE) LOTN Apply 1 application topically daily.    [provider]  escitalopram (  LEXAPRO) 10 MG tablet TAKE 1 TABLET BY MOUTH DAILY Patient taking differently: TAKE 10 MG BY MOUTH DAILY 05/04/16   Kathrynn Ducking, MD  gabapentin (NEURONTIN) 100 MG capsule Take 100 mg by mouth 2 (two) times daily.    [provider]  guaifenesin (ROBITUSSIN) 100 MG/5ML syrup Take 200 mg by mouth every 6 (six) hours as needed for cough.    [provider]  loperamide (IMODIUM A-D) 2 MG tablet Take 2 mg by mouth 4 (four) times daily as needed for diarrhea or loose stools.    [provider]  magnesium hydroxide (MILK OF MAGNESIA) 400 MG/5ML suspension Take 30 mLs by mouth at bedtime as needed for mild constipation.    [provider]  memantine  (NAMENDA) 10 MG tablet TAKE 1 TABLET (10 MG TOTAL) BY MOUTH 2 (TWO) TIMES DAILY. 04/19/16   Ward Givens, NP  mirtazapine (REMERON) 15 MG tablet Take 15 mg by mouth at bedtime.    [provider]  Multiple Vitamins-Minerals (PRESERVISION AREDS 2 PO) Take 1 capsule by mouth 2 (two) times daily.    [provider]  neomycin-bacitracin-polymyxin (NEOSPORIN) [id number] ointment Apply 1 application topically daily as needed (skin tear). Clean area with normal saline, apply neosporin and cover with band-air or guaze and tape change as needed until healed.    [provider]  traZODone (DESYREL) 50 MG tablet Take 50 mg by mouth at bedtime as needed for sleep.     [provider]  traZODone (DESYREL) 50 MG tablet Take 25 mg by mouth at bedtime as needed (agitation).    [provider]    Family History Family History  Problem Relation Age of Onset  . Stroke Mother   . Lung cancer Brother   . Tuberculosis Father     Social History Social History  Substance Use Topics  . Smoking status: Never Smoker  . Smokeless tobacco: Never Used  . Alcohol use No     Allergies   Penicillins and Ativan [lorazepam]   Review of Systems Review of Systems  Unable to perform ROS: Dementia     Physical Exam Updated Vital Signs BP (!) 150/80 (BP Location: Right Arm)   Pulse 60   Temp 98.1 F (36.7 C)   Resp 18   SpO2 100%   Physical Exam  Constitutional: He appears well-developed and well-nourished. No distress.  HENT:  Head: Normocephalic and atraumatic.  Mouth/Throat: Oropharynx is clear and moist. No oropharyngeal exudate.  Eyes: Pupils are equal, round, and reactive to light. Conjunctivae are normal. Right eye exhibits no discharge. Left eye exhibits no discharge. No scleral icterus.  Neck: Normal range of motion. Neck supple. No spinous process tenderness present. No thyromegaly present.  Cardiovascular: Normal rate, regular rhythm, normal heart  sounds and intact distal pulses.  Exam reveals no gallop and no friction rub.   No murmur heard. Pulmonary/Chest: Effort normal and breath sounds normal. No stridor. No respiratory distress. He has no wheezes. He has no rales.  Abdominal: Soft. Bowel sounds are normal. He exhibits no distension. There is no tenderness. There is no rebound and no guarding.  Musculoskeletal: He exhibits no edema.  No midline tenderness to the cervical, thoracic, or lumbar spine Right anterior hip tenderness as well as right knee and shin tenderness No tenderness to left leg or hip  Lymphadenopathy:    He has no cervical adenopathy.  Neurological: He is alert. Coordination normal.  Sensation intact bilaterally, 5/5 strength to upper  extremities, equal bilateral grip strength, 3/5 strength to the left lower extremity, patient is not voluntarily moving the right leg  Skin: Skin is warm and dry. No rash noted. He is not diaphoretic. No pallor.  Psychiatric: He has a normal mood and affect.  Nursing note and vitals reviewed.    ED Treatments / Results  Labs (all labs ordered are listed, but only abnormal results are displayed) Labs Reviewed  CBC WITH DIFFERENTIAL/PLATELET - Abnormal; Notable for the following:       Result Value   RBC 3.79 (*)    HCT 38.6 (*)    MCV 101.8 (*)    MCH 34.6 (*)    All other components within normal limits  URINALYSIS, ROUTINE W REFLEX MICROSCOPIC - Abnormal; Notable for the following:    Color, Urine STRAW (*)    All other components within normal limits  COMPREHENSIVE METABOLIC PANEL  PROTIME-INR  APTT  CBC  BASIC METABOLIC PANEL  TYPE AND SCREEN    EKG  EKG Interpretation None       Radiology Dg Chest 1 View  Result Date: 01/23/2017 CLINICAL DATA:  Status post fall. EXAM: CHEST 1 VIEW COMPARISON:  May 01, 2016 FINDINGS: The mediastinal contour is normal. The heart size is mildly enlarged. There is chronic elevation of right hemidiaphragm. There is a  small right pleural effusion with mild subjacent atelectasis. The left lung is clear. The bony structures are stable. IMPRESSION: Chronic elevation of the right hemidiaphragm. Small right pleural effusion with mild subjacent atelectasis. Electronically Signed   By: Abelardo Diesel M.D.   On: 01/23/2017 19:59   Dg Tibia/fibula Right  Result Date: 01/23/2017 CLINICAL DATA:  Leg pain, fall from wheelchair EXAM: RIGHT TIBIA AND FIBULA - 2 VIEW COMPARISON:  None. FINDINGS: The study is limited by patient mobility and positioning. No definite acute displaced fracture or malalignment is seen. There are vascular calcifications. Sock artifact over the foot. IMPRESSION: No definite acute osseous abnormality. Electronically Signed   By: Donavan Foil M.D.   On: 01/23/2017 21:05   Ct Head Wo Contrast  Result Date: 01/23/2017 CLINICAL DATA:  Patient BIB EMS from [facility]. Staff reported the patient "got up and took off running out of his wheelchair, and fell after about 10-68ft." Staff reported patient did not hit his head or lose consciousness. Staff reports patient is not on blood thinners. Patient has history of dementia. Patient c/o pain to right leg. Shortening and rotation noted to right lower extremity. Patient is high fall risk and was discharged from [facility] last night for another fall. EXAM: CT HEAD WITHOUT CONTRAST CT CERVICAL SPINE WITHOUT CONTRAST TECHNIQUE: Multidetector CT imaging of the head and cervical spine was performed following the standard protocol without intravenous contrast. Multiplanar CT image reconstructions of the cervical spine were also generated. COMPARISON:  12/22/2016 FINDINGS: CT HEAD FINDINGS Brain: No evidence of acute infarction, hemorrhage, hydrocephalus, extra-axial collection or mass lesion/mass effect. Moderate atrophy and extensive white matter hypoattenuation consistent with advanced chronic microvascular ischemic change, stable from previous day's study. Vascular: No  hyperdense vessel or unexpected calcification. Skull: Normal. Negative for fracture or focal lesion. Sinuses/Orbits: No acute abnormality of the globes and orbits. Sinuses and mastoid air cells are clear. Other: None. CT CERVICAL SPINE FINDINGS Alignment: Exaggerated lordosis. Mild degenerative anterolisthesis of C7 on T1. Skull base and vertebrae: No acute fracture. No primary bone lesion or focal pathologic process. Soft tissues and spinal canal: No prevertebral fluid or swelling. No visible canal  hematoma. Disc levels: Moderate loss of disc height at C3-C4. Mild loss of disc height at C4-C5. Marked loss of disc height at C5-C6, C6-C7 and C7-T1. There is spondylotic disc bulging with endplate spurring at these levels. Bilateral facet degenerative changes are noted from C2-C3 through C7-T1. There are varying degrees of neural foraminal narrowing due to facet and uncovertebral spurring. No convincing disc herniation. Upper chest: No acute findings. Other: None. IMPRESSION: HEAD CT 1. No acute intracranial abnormalities. 2. Atrophy and advanced chronic microvascular ischemic change. Stable appearance from the previous day's study. CERVICAL CT 1. No fracture or acute findings. 2. Advanced degenerative changes stable from the prior cervical CT. Electronically Signed   By: Lajean Manes M.D.   On: 01/23/2017 20:40   Ct Cervical Spine Wo Contrast  Result Date: 01/23/2017 CLINICAL DATA:  [facility] EMS [facility]. Staff reported the patient "got up and took off running out of his wheelchair, and fell after about 10-29ft." Staff reported patient did not hit his head or lose consciousness. Staff reports patient is not on blood thinners. Patient has history of dementia. Patient c/o pain to right leg. Shortening and rotation noted to right lower extremity. Patient is high fall risk and was discharged from [facility] last night for another fall. EXAM: CT HEAD WITHOUT CONTRAST CT CERVICAL SPINE WITHOUT CONTRAST  TECHNIQUE: Multidetector CT imaging of the head and cervical spine was performed following the standard protocol without intravenous contrast. Multiplanar CT image reconstructions of the cervical spine were also generated. COMPARISON:  12/22/2016 FINDINGS: CT HEAD FINDINGS Brain: No evidence of acute infarction, hemorrhage, hydrocephalus, extra-axial collection or mass lesion/mass effect. Moderate atrophy and extensive white matter hypoattenuation consistent with advanced chronic microvascular ischemic change, stable from previous day's study. Vascular: No hyperdense vessel or unexpected calcification. Skull: Normal. Negative for fracture or focal lesion. Sinuses/Orbits: No acute abnormality of the globes and orbits. Sinuses and mastoid air cells are clear. Other: None. CT CERVICAL SPINE FINDINGS Alignment: Exaggerated lordosis. Mild degenerative anterolisthesis of C7 on T1. Skull base and vertebrae: No acute fracture. No primary bone lesion or focal pathologic process. Soft tissues and spinal canal: No prevertebral fluid or swelling. No visible canal hematoma. Disc levels: Moderate loss of disc height at C3-C4. Mild loss of disc height at C4-C5. Marked loss of disc height at C5-C6, C6-C7 and C7-T1. There is spondylotic disc bulging with endplate spurring at these levels. Bilateral facet degenerative changes are noted from C2-C3 through C7-T1. There are varying degrees of neural foraminal narrowing due to facet and uncovertebral spurring. No convincing disc herniation. Upper chest: No acute findings. Other: None. IMPRESSION: HEAD CT 1. No acute intracranial abnormalities. 2. Atrophy and advanced chronic microvascular ischemic change. Stable appearance from the previous day's study. CERVICAL CT 1. No fracture or acute findings. 2. Advanced degenerative changes stable from the prior cervical CT. Electronically Signed   By: Lajean Manes M.D.   On: 01/23/2017 20:40   Dg Knee Complete 4 Views Right  Result Date:  01/23/2017 CLINICAL DATA:  [address] out of wheelchair with pain to the leg EXAM: RIGHT KNEE - COMPLETE 4+ VIEW COMPARISON:  01/23/2017 FINDINGS: Mild to moderate patellofemoral degenerative changes. Mild to moderate medial and lateral degenerative changes. No acute displaced fracture. Vascular calcifications. No large effusion. IMPRESSION: Degenerative changes.  No definite acute osseous abnormality. Electronically Signed   By: Donavan Foil M.D.   On: 01/23/2017 21:02   Dg Hip Unilat  With Pelvis 2-3 Views Right  Result Date:  01/23/2017 CLINICAL DATA:  Status post fall. EXAM: DG HIP (WITH OR WITHOUT PELVIS) 2-3V RIGHT COMPARISON:  None. FINDINGS: There is deformity of the right proximal femur at the junction of femoral head and neck is consistent with fracture. Degenerative joint changes of the spine are noted. IMPRESSION: Deformity of the right proximal femur at the junction of femoral head and neck is consistent with fracture. Electronically Signed   By: Abelardo Diesel M.D.   On: 01/23/2017 19:58    Procedures Procedures (including critical care time)  Medications Ordered in ED Medications  lubriderm seriously sensitive lotion 1 application (not administered)  traZODone (DESYREL) tablet 25 mg (not administered)  divalproex (DEPAKOTE) DR tablet 250 mg (not administered)  gabapentin (NEURONTIN) capsule 100 mg (not administered)  PRESERVISION AREDS 2 CAPS (not administered)  acetaminophen (TYLENOL) tablet 500 mg (not administered)  alum & mag hydroxide-simeth (MAALOX/MYLANTA) 200-200-20 MG/5ML suspension 30 mL (not administered)  guaifenesin (ROBITUSSIN) 100 MG/5ML syrup 200 mg (not administered)  loperamide (IMODIUM A-D) tablet 2 mg (not administered)  magnesium hydroxide (MILK OF MAGNESIA) suspension 30 mL (not administered)  mirtazapine (REMERON) tablet 15 mg (not administered)  neomycin-bacitracin-polymyxin (NEOSPORIN) ointment 1 application (not administered)  escitalopram (LEXAPRO) tablet 10  mg (not administered)  memantine (NAMENDA) tablet 10 mg (not administered)  donepezil (ARICEPT) tablet 10 mg (not administered)  amLODipine (NORVASC) tablet 5 mg (not administered)  hydrALAZINE (APRESOLINE) injection 5 mg (not administered)  ondansetron (ZOFRAN) injection 4 mg (not administered)  0.9 %  sodium chloride infusion ( Intravenous New Bag/Given 01/23/17 2316)  oxyCODONE-acetaminophen (PERCOCET/ROXICET) 5-325 MG per tablet 1 tablet (not administered)  morphine 2 MG/ML injection 1 mg (not administered)  methocarbamol (ROBAXIN) tablet 500 mg (not administered)  senna-docusate (Senokot-S) tablet 1 tablet (not administered)     Initial Impression / Assessment and Plan / ED Course  I have reviewed the triage vital signs and the nursing notes.  Pertinent labs & imaging results that were available during my care of the patient were reviewed by me and considered in my medical decision making (see chart for details).     Patient with right hip fracture after trip and fall. Patient with Alzheimer's dementia. X-rays of the knee and tib-fib negative. CT head and C-spine negative for acute findings. X-ray of the right hip shows deformity of the right proximal femur at the junction of the femoral head and neck consistent with fracture. I consulted Dr. Percell Miller who recommends admission to [facility] for surgery tomorrow. He advises NPO after midnight tonight. Patient continues to decline pain medication. I consulted [facility], Dr. Blaine Hamper, who will admit the patient. Accepting physician at [facility], Dr. Alcario Drought. I spoke with patient's power of attorney, Terald Sleeper the phone to update him. Patient also evaluated by Dr. Leonette Monarch who agrees with plan.  Final Clinical Impressions(s) / ED Diagnoses   Final diagnoses:  Closed fracture of right hip, initial encounter [facility])    New Prescriptions New Prescriptions   No medications on file         Caryl Ada 01/23/17  2317    Fatima Blank, MD 01/24/17 [phone]

## 2017-01-24 ENCOUNTER — Encounter (HOSPITAL_COMMUNITY)
Admission: EM | Disposition: A | Discharge status: Skilled Nursing Facility | Payer: Self-pay | Source: Home / Self Care | Attending: Internal Medicine

## 2017-01-24 ENCOUNTER — Inpatient Hospital Stay (HOSPITAL_COMMUNITY): Payer: Medicare Other

## 2017-01-24 ENCOUNTER — Inpatient Hospital Stay (HOSPITAL_COMMUNITY): Payer: Medicare Other | Admitting: Anesthesiology

## 2017-01-24 ENCOUNTER — Encounter (HOSPITAL_COMMUNITY): Payer: Self-pay | Admitting: Certified Registered Nurse Anesthetist

## 2017-01-24 DIAGNOSIS — S72001G Fracture of unspecified part of neck of right femur, subsequent encounter for closed fracture with delayed healing: Secondary | ICD-10-CM

## 2017-01-24 DIAGNOSIS — G309 Alzheimer's disease, unspecified: Secondary | ICD-10-CM

## 2017-01-24 DIAGNOSIS — F0281 Dementia in other diseases classified elsewhere with behavioral disturbance: Secondary | ICD-10-CM

## 2017-01-24 HISTORY — PX: HEMIARTHROPLASTY HIP: SUR652

## 2017-01-24 HISTORY — PX: HIP ARTHROPLASTY: SHX981

## 2017-01-24 LAB — TYPE AND SCREEN
ABO/RH(D): O POS
ABO/RH(D): O POS
Antibody Screen: NEGATIVE
Antibody Screen: NEGATIVE

## 2017-01-24 LAB — PROTIME-INR
INR: 0.96
Prothrombin Time: 12.7 seconds (ref 11.4–15.2)

## 2017-01-24 LAB — ABO/RH
ABO/RH(D): O POS
ABO/RH(D): O POS

## 2017-01-24 LAB — APTT: APTT: 25 s (ref 24–36)

## 2017-01-24 LAB — CK: CK TOTAL: 226 U/L (ref 49–397)

## 2017-01-24 SURGERY — HEMIARTHROPLASTY, HIP, DIRECT ANTERIOR APPROACH, FOR FRACTURE
Anesthesia: General | Site: Hip | Laterality: Right

## 2017-01-24 MED ORDER — CHLORHEXIDINE GLUCONATE 4 % EX LIQD: 60.0000 mL | Freq: Once | CUTANEOUS | Status: DC

## 2017-01-24 MED ORDER — CEFAZOLIN SODIUM-DEXTROSE 2-4 GM/100ML-% IV SOLN
INTRAVENOUS | Status: AC
  Filled 2017-01-24: qty 100

## 2017-01-24 MED ORDER — PHENYLEPHRINE HCL 10 MG/ML IJ SOLN
INTRAVENOUS | Status: DC | PRN
  Administered 2017-01-24: 50 ug/min via INTRAVENOUS

## 2017-01-24 MED ORDER — ROCURONIUM BROMIDE 100 MG/10ML IV SOLN
INTRAVENOUS | Status: DC | PRN
  Administered 2017-01-24: 35 mg via INTRAVENOUS

## 2017-01-24 MED ORDER — FENTANYL CITRATE (PF) 250 MCG/5ML IJ SOLN
INTRAMUSCULAR | Status: AC
  Filled 2017-01-24: qty 5

## 2017-01-24 MED ORDER — KETOROLAC TROMETHAMINE 30 MG/ML IJ SOLN
INTRAMUSCULAR | Status: DC | PRN
  Administered 2017-01-24: 30 mg via INTRAVENOUS

## 2017-01-24 MED ORDER — LACTATED RINGERS IV SOLN
INTRAVENOUS | Status: DC | PRN
  Administered 2017-01-24 (×2): via INTRAVENOUS

## 2017-01-24 MED ORDER — PHENYLEPHRINE HCL 10 MG/ML IJ SOLN
INTRAMUSCULAR | Status: DC | PRN
  Administered 2017-01-24 (×2): 80 ug via INTRAVENOUS

## 2017-01-24 MED ORDER — ENSURE ENLIVE PO LIQD
237.0000 mL | Freq: Two times a day (BID) | ORAL | Status: DC
  Administered 2017-01-25: 237 mL via ORAL

## 2017-01-24 MED ORDER — HYDROCODONE-ACETAMINOPHEN 5-325 MG PO TABS: 1.0000 | ORAL_TABLET | Freq: Four times a day (QID) | ORAL | Status: DC | PRN

## 2017-01-24 MED ORDER — PROPOFOL 10 MG/ML IV BOLUS
INTRAVENOUS | Status: DC | PRN
  Administered 2017-01-24: 90 mg via INTRAVENOUS

## 2017-01-24 MED ORDER — LIDOCAINE HCL (CARDIAC) 20 MG/ML IV SOLN
INTRAVENOUS | Status: DC | PRN
  Administered 2017-01-24: 60 mg via INTRAVENOUS

## 2017-01-24 MED ORDER — CEFAZOLIN SODIUM-DEXTROSE 2-4 GM/100ML-% IV SOLN
2.0000 g | INTRAVENOUS | Status: AC
  Administered 2017-01-24: 2 g via INTRAVENOUS

## 2017-01-24 MED ORDER — HYDROCODONE-ACETAMINOPHEN 5-325 MG PO TABS: 1.0000 | ORAL_TABLET | Freq: Four times a day (QID) | ORAL | 0 refills | Status: AC | PRN

## 2017-01-24 MED ORDER — ONDANSETRON HCL 4 MG/2ML IJ SOLN: 4.0000 mg | Freq: Four times a day (QID) | INTRAMUSCULAR | Status: DC | PRN

## 2017-01-24 MED ORDER — EPHEDRINE SULFATE 50 MG/ML IJ SOLN
INTRAMUSCULAR | Status: DC | PRN
  Administered 2017-01-24 (×3): 5 mg via INTRAVENOUS

## 2017-01-24 MED ORDER — TRANEXAMIC ACID 1000 MG/10ML IV SOLN
INTRAVENOUS | Status: AC | PRN
  Administered 2017-01-24: 2000 mg via TOPICAL

## 2017-01-24 MED ORDER — SODIUM CHLORIDE 0.9% FLUSH
INTRAVENOUS | Status: DC | PRN
  Administered 2017-01-24: 30 mL via INTRAVENOUS

## 2017-01-24 MED ORDER — OXYCODONE HCL 5 MG/5ML PO SOLN: 5.0000 mg | Freq: Once | ORAL | Status: DC | PRN

## 2017-01-24 MED ORDER — TRANEXAMIC ACID 1000 MG/10ML IV SOLN
1000.0000 mg | INTRAVENOUS | Status: AC
  Administered 2017-01-24: 1000 mg via INTRAVENOUS
  Filled 2017-01-24 (×2): qty 10

## 2017-01-24 MED ORDER — 0.9 % SODIUM CHLORIDE (POUR BTL) OPTIME
TOPICAL | Status: DC | PRN
  Administered 2017-01-24: 1000 mL

## 2017-01-24 MED ORDER — HYDROMORPHONE HCL 1 MG/ML IJ SOLN: 0.2500 mg | INTRAMUSCULAR | Status: DC | PRN

## 2017-01-24 MED ORDER — OXYCODONE HCL 5 MG PO TABS: 5.0000 mg | ORAL_TABLET | Freq: Once | ORAL | Status: DC | PRN

## 2017-01-24 MED ORDER — CEFAZOLIN SODIUM-DEXTROSE 1-4 GM/50ML-% IV SOLN
1.0000 g | Freq: Four times a day (QID) | INTRAVENOUS | Status: AC
  Administered 2017-01-24 – 2017-01-25 (×3): 1 g via INTRAVENOUS
  Filled 2017-01-24 (×3): qty 50

## 2017-01-24 MED ORDER — SUGAMMADEX SODIUM 200 MG/2ML IV SOLN
INTRAVENOUS | Status: DC | PRN
  Administered 2017-01-24: 200 mg via INTRAVENOUS

## 2017-01-24 MED ORDER — ENOXAPARIN SODIUM 40 MG/0.4ML ~~LOC~~ SOLN
40.0000 mg | SUBCUTANEOUS | Status: DC
  Administered 2017-01-25 – 2017-01-29 (×5): 40 mg via SUBCUTANEOUS
  Filled 2017-01-24 (×4): qty 0.4

## 2017-01-24 MED ORDER — ENOXAPARIN SODIUM 40 MG/0.4ML ~~LOC~~ SOLN: 40.0000 mg | SUBCUTANEOUS | 0 refills | Status: AC

## 2017-01-24 MED ORDER — FENTANYL CITRATE (PF) 100 MCG/2ML IJ SOLN: 25.0000 ug | INTRAMUSCULAR | Status: DC | PRN

## 2017-01-24 MED ORDER — KETOROLAC TROMETHAMINE 15 MG/ML IJ SOLN: 7.5000 mg | Freq: Three times a day (TID) | INTRAMUSCULAR | Status: AC | PRN

## 2017-01-24 MED ORDER — TRANEXAMIC ACID 1000 MG/10ML IV SOLN
2000.0000 mg | Freq: Once | INTRAVENOUS | Status: DC
  Filled 2017-01-24 (×3): qty 20

## 2017-01-24 MED ORDER — ONDANSETRON HCL 4 MG/2ML IJ SOLN
INTRAMUSCULAR | Status: DC | PRN
  Administered 2017-01-24: 4 mg via INTRAVENOUS

## 2017-01-24 MED ORDER — MORPHINE SULFATE (PF) 2 MG/ML IV SOLN
INTRAVENOUS | Status: AC
  Filled 2017-01-24: qty 1

## 2017-01-24 MED ORDER — MORPHINE SULFATE (PF) 4 MG/ML IV SOLN: 1.0000 mg | INTRAVENOUS | Status: DC | PRN

## 2017-01-24 MED ORDER — DEXTROSE-NACL 5-0.9 % IV SOLN
INTRAVENOUS | Status: DC
  Administered 2017-01-24 – 2017-01-25 (×2): via INTRAVENOUS

## 2017-01-24 MED ORDER — FENTANYL CITRATE (PF) 100 MCG/2ML IJ SOLN
INTRAMUSCULAR | Status: DC | PRN
  Administered 2017-01-24: 75 ug via INTRAVENOUS
  Administered 2017-01-24: 25 ug via INTRAVENOUS

## 2017-01-24 MED ORDER — BUPIVACAINE HCL 0.5 % IJ SOLN
INTRAMUSCULAR | Status: DC | PRN
  Administered 2017-01-24: 30 mL via INTRA_ARTICULAR

## 2017-01-24 SURGICAL SUPPLY — 51 items
BAG DECANTER FOR FLEXI CONT (MISCELLANEOUS) ×2 IMPLANT
BIT DRILL 7/64X5 DISP (BIT) ×3 IMPLANT
BLADE SAGITTAL 25.0X1.27X90 (BLADE) ×2 IMPLANT
BLADE SAGITTAL 25.0X1.27X90MM (BLADE) ×1
CAPT HIP HEMI 2 ×3 IMPLANT
CLOSURE STERI-STRIP 1/2X4 (GAUZE/BANDAGES/DRESSINGS) ×2
CLOSURE WOUND 1/2 X4 (GAUZE/BANDAGES/DRESSINGS) ×1
CLSR STERI-STRIP ANTIMIC 1/2X4 (GAUZE/BANDAGES/DRESSINGS) ×4 IMPLANT
COVER SURGICAL LIGHT HANDLE (MISCELLANEOUS) ×3 IMPLANT
DECANTER SPIKE VIAL GLASS SM (MISCELLANEOUS) ×2 IMPLANT
DRAPE ORTHO SPLIT 77X108 STRL (DRAPES) ×6
DRAPE SURG ORHT 6 SPLT 77X108 (DRAPES) ×2 IMPLANT
DRAPE U-SHAPE 47X51 STRL (DRAPES) ×3 IMPLANT
DRESSING ALLEVYN LIFE SACRUM (GAUZE/BANDAGES/DRESSINGS) ×2 IMPLANT
DRSG MEPILEX BORDER 4X8 (GAUZE/BANDAGES/DRESSINGS) ×3 IMPLANT
DURAPREP 26ML APPLICATOR (WOUND CARE) ×3 IMPLANT
ELECT BLADE 4.0 EZ CLEAN MEGAD (MISCELLANEOUS) ×3
ELECT CAUTERY BLADE 6.4 (BLADE) ×3 IMPLANT
ELECT REM PT RETURN 9FT ADLT (ELECTROSURGICAL) ×3
ELECTRODE BLDE 4.0 EZ CLN MEGD (MISCELLANEOUS) ×1 IMPLANT
ELECTRODE REM PT RTRN 9FT ADLT (ELECTROSURGICAL) ×1 IMPLANT
FACESHIELD WRAPAROUND (MASK) ×3 IMPLANT
FACESHIELD WRAPAROUND OR TEAM (MASK) ×1 IMPLANT
GLOVE BIO SURGEON STRL SZ7.5 (GLOVE) ×6 IMPLANT
GLOVE BIOGEL PI IND STRL 8 (GLOVE) ×2 IMPLANT
GLOVE BIOGEL PI INDICATOR 8 (GLOVE) ×4
GOWN STRL REUS W/ TWL LRG LVL3 (GOWN DISPOSABLE) ×2 IMPLANT
GOWN STRL REUS W/ TWL XL LVL3 (GOWN DISPOSABLE) ×1 IMPLANT
GOWN STRL REUS W/TWL LRG LVL3 (GOWN DISPOSABLE) ×6
GOWN STRL REUS W/TWL XL LVL3 (GOWN DISPOSABLE) ×3
HIP CAPITATED HEMI 2 IMPLANT
KIT BASIN OR (CUSTOM PROCEDURE TRAY) ×3 IMPLANT
KIT ROOM TURNOVER OR (KITS) ×3 IMPLANT
MANIFOLD NEPTUNE II (INSTRUMENTS) ×3 IMPLANT
NS IRRIG 1000ML POUR BTL (IV SOLUTION) ×3 IMPLANT
PACK TOTAL JOINT (CUSTOM PROCEDURE TRAY) ×3 IMPLANT
PAD ARMBOARD 7.5X6 YLW CONV (MISCELLANEOUS) ×6 IMPLANT
PILLOW ABDUCTION HIP (SOFTGOODS) ×3 IMPLANT
RETRIEVER SUT HEWSON (MISCELLANEOUS) ×3 IMPLANT
STRIP CLOSURE SKIN 1/2X4 (GAUZE/BANDAGES/DRESSINGS) ×1 IMPLANT
SUT FIBERWIRE #2 38 REV NDL BL (SUTURE) ×6
SUT MNCRL AB 4-0 PS2 18 (SUTURE) ×3 IMPLANT
SUT MON AB 2-0 CT1 36 (SUTURE) ×3 IMPLANT
SUT VIC AB 1 CT1 27 (SUTURE) ×3
SUT VIC AB 1 CT1 27XBRD ANBCTR (SUTURE) ×1 IMPLANT
SUT VLOC 180 0 24IN GS25 (SUTURE) ×2 IMPLANT
SUTURE FIBERWR#2 38 REV NDL BL (SUTURE) ×2 IMPLANT
SYRINGE 60CC LL (MISCELLANEOUS) ×2 IMPLANT
TOWEL OR 17X24 6PK STRL BLUE (TOWEL DISPOSABLE) ×3 IMPLANT
TOWEL OR 17X26 10 PK STRL BLUE (TOWEL DISPOSABLE) ×3 IMPLANT
TRAY FOLEY CATH SILVER 14FR (SET/KITS/TRAYS/PACK) ×2 IMPLANT

## 2017-01-24 NOTE — ED Notes (Signed)
[facility] called for transport. Report called and given to receiving Nurse Montel Culver

## 2017-01-24 NOTE — Op Note (Signed)
01/23/2017 - 01/24/2017  1:57 PM  PATIENT:  Jimmy Solis   MRN: [id number]  PRE-OPERATIVE DIAGNOSIS:  Right Hip Fracture  POST-OPERATIVE DIAGNOSIS:  Right Hip Fracture  PROCEDURE:  Procedure(s): ARTHROPLASTY BIPOLAR HIP (HEMIARTHROPLASTY)  PREOPERATIVE INDICATIONS:  Jimmy Solis is an 83 y.o. male who was admitted 01/23/2017 with a diagnosis of Closed right hip fracture ([place]) and elected for surgical management.  The risks benefits and alternatives were discussed with the patient including but not limited to the risks of nonoperative treatment, versus surgical intervention including infection, bleeding, nerve injury, periprosthetic fracture, the need for revision surgery, dislocation, leg length discrepancy, blood clots, cardiopulmonary complications, morbidity, mortality, among others, and they were willing to proceed.  Predicted outcome is good, although there will be at least a six to nine month expected recovery.   OPERATIVE REPORT     SURGEON:  Edmonia Lynch, MD    ASSISTANT:  Roxan Hockey, PA-C, he was present and scrubbed throughout the case, critical for completion in a timely fashion, and for retraction, instrumentation, and closure.     ANESTHESIA:  General    COMPLICATIONS:  None.      COMPONENTS:  [employer] Acol[employer]: Femoral stem: 3, Femoral Head:49, Neck:0   PROCEDURE IN DETAIL: The patient was met in the holding area and identified.  The appropriate hip  was marked at the operative site. The patient was then transported to the OR and  placed under general anesthesia.  At that point, the patient was  placed in the lateral decubitus position with the operative side up and  secured to the operating room table and all bony prominences padded.     The operative lower extremity was prepped from the iliac crest to the toes.  Sterile draping was performed.  Time out was performed prior to incision.      A routine posterolateral approach was utilized via sharp dissection   carried down to the subcutaneous tissue.  Gross bleeders were Bovie  coagulated.  The iliotibial band was identified and incised  along the length of the skin incision.  Self-retaining retractors were  inserted. I examined the bursa there was significant hematoma and edema I performed a bursectomy here.  With the hip internally rotated, the short external rotators  were identified. The piriformis was tagged with FiberWire, and the hip capsule released in a T-type fashion.  The femoral neck was exposed, and I resected the femoral neck using the appropriate jig. This was performed at approximately a thumb's breadth above the lesser trochanter.    I then exposed the deep acetabulum, cleared out any tissue including the ligamentum teres, and included the hip capsule in the FiberWire used above and below the T.    I then prepared the proximal femur using the cookie-cutter, the lateralizing reamer, and then sequentially broached.  A trial utilized, and I reduced the hip and it was found to have excellent stability with functional range of motion. The trial components were then removed.   The canal and acetabulum were thoroughly irrigated  I inserted the pressfit stem and placed the head and neck collar. The hip was reduced with appropriate force and was stable through a range of motion.   I then used a 2 mm drill bits to pass the FiberWire suture from the capsule and puriform is through the greater trochanter, and secured this. Excellent posterior capsular repair was achieved. I also closed the T in the capsule.  I then irrigated the hip copiously again with  pulse lavage, and repaired the fascia with Vicryl, followed by Vicryl for the subcutaneous tissue, Monocryl for the skin, Steri-Strips and sterile gauze. The wounds were injected. The patient was then awakened and returned to PACU in stable and satisfactory condition. There were no complications.  POST-OP PLAN: Weight bearing as tolerated. DVT px  will consist of SCD's and chemical px  Edmonia Lynch, MD Orthopedic Surgeon [phone]   01/24/2017 1:57 PM

## 2017-01-24 NOTE — Anesthesia Procedure Notes (Signed)
Procedure Name: Intubation Date/Time: 01/24/2017 10:11 AM Performed by: Gaylene Brooks Pre-anesthesia Checklist: Patient identified, Emergency Drugs available, Suction available and Patient being monitored Patient Re-evaluated:Patient Re-evaluated prior to induction Oxygen Delivery Method: Circle System Utilized Preoxygenation: Pre-oxygenation with 100% oxygen Induction Type: IV induction Ventilation: Mask ventilation without difficulty Laryngoscope Size: Miller and 2 Grade View: Grade II Tube type: Oral Tube size: 7.5 mm Number of attempts: 1 Airway Equipment and Method: Stylet and Oral airway Placement Confirmation: ETT inserted through vocal cords under direct vision,  positive ETCO2 and breath sounds checked- equal and bilateral Secured at: 23 cm Tube secured with: Tape Dental Injury: Teeth and Oropharynx as per pre-operative assessment

## 2017-01-24 NOTE — Anesthesia Preprocedure Evaluation (Signed)
Anesthesia Evaluation  Patient identified by MRN, date of birth, ID band Patient awake    Reviewed: Allergy & Precautions, H&P , NPO status , Patient's Chart, lab work & pertinent test results  Airway Mallampati: II   Neck ROM: full    Dental   Pulmonary neg pulmonary ROS,    breath sounds clear to auscultation       Cardiovascular hypertension, + Peripheral Vascular Disease   Rhythm:regular Rate:Normal     Neuro/Psych PSYCHIATRIC DISORDERS Depression TIA Neuromuscular disease CVA    GI/Hepatic   Endo/Other    Renal/GU      Musculoskeletal   Abdominal   Peds  Hematology   Anesthesia Other Findings   Reproductive/Obstetrics                             Anesthesia Physical Anesthesia Plan  ASA: III  Anesthesia Plan: General   Post-op Pain Management:    Induction: Intravenous  PONV Risk Score and Plan: 2 and Ondansetron, Dexamethasone and Treatment may vary due to age or medical condition  Airway Management Planned: Oral ETT  Additional Equipment:   Intra-op Plan:   Post-operative Plan: Extubation in OR  Informed Consent: I have reviewed the patients History and Physical, chart, labs and discussed the procedure including the risks, benefits and alternatives for the proposed anesthesia with the patient or authorized representative who has indicated his/her understanding and acceptance.     Plan Discussed with: CRNA, Anesthesiologist and Surgeon  Anesthesia Plan Comments:         Anesthesia Quick Evaluation

## 2017-01-24 NOTE — Interval H&P Note (Signed)
History and Physical Interval Note:  01/24/2017 9:03 AM  Jimmy Solis  has presented today for surgery, with the diagnosis of Right Hip Fracture  The various methods of treatment have been discussed with the patient and family. After consideration of risks, benefits and other options for treatment, the patient has consented to  Procedure(s): ARTHROPLASTY BIPOLAR HIP (HEMIARTHROPLASTY) (Right) as a surgical intervention .  The patient's history has been reviewed, patient examined, no change in status, stable for surgery.  I have reviewed the patient's chart and labs.  Questions were answered to the patient's satisfaction.     MURPHY, TIMOTHY D

## 2017-01-24 NOTE — Transfer of Care (Signed)
Immediate Anesthesia Transfer of Care Note  Patient: Jimmy Solis  Procedure(s) Performed: Procedure(s): ARTHROPLASTY BIPOLAR HIP (HEMIARTHROPLASTY) (Right)  Patient Location: PACU  Anesthesia Type:General  Level of Consciousness: awake, drowsy and confused  Airway & Oxygen Therapy: Patient Spontanous Breathing and Patient connected to face mask oxygen  Post-op Assessment: Report given to RN, Post -op Vital signs reviewed and stable and Patient moving all extremities X 4  Post vital signs: Reviewed and stable  Last Vitals:  Vitals:   01/24/17 0205 01/24/17 0544  BP: (!) 160/66 111/60  Pulse: 75 63  Resp: (!) 21 16  Temp: 36.6 C (!) 36.1 C  SpO2: 98% 95%    Last Pain:  Vitals:   01/24/17 0544  TempSrc: Axillary  PainSc:          Complications: No apparent anesthesia complications

## 2017-01-24 NOTE — Progress Notes (Signed)
[facility] PROGRESS NOTE  Jimmy Solis OJJ:[id number] DOB: [birth year] DOA: 01/23/2017 PCP: Cyndy Freeze, MD  Interim Summary and HPI: Mr. 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 He had just been discharged that morning for another fall. He was found to have a right femoral neck fracture. He is scheduled to undergo a right hip hemiarthroplasty today. Family not at bedside.   Power of Attorney: Philipp Ovens [phone]  Assessment/Plan: 1. Right femoral neck fracture - Planned right hip hemiarthroplasty today -NPO until after surgery  2. Hypertension -Improved this morning. Continue to monitor postoperatively.  -Continue current medications  3. Alzheimer's  -Dementia likely aggravated by pain, medications, injury, and hospitalization.  -CT head done in ER revealed no acute intracranial abnormalities. -Continue current medications.  4. 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 Lives at [facility].   DVT Prophylaxis: SCDs Code Status: Full Family Communication: Nurse stated she spoke to [place] Philipp Ovens: [phone]) and said he is planning on coming to the hospital to consent for surgery. Not currently at bedside. Disposition Plan: Return to SNF once medically stable.  Consultants:  Orthopedics  Procedures:  Right hip hemiarthroplasty today  Antibiotics:  None  HPI/Subjective: Unable to perform a review of systems due to dementia. Informed patient that he broke his right hip and will need surgery.   Objective: Vitals:   01/24/17 0205 01/24/17 0544  BP: (!) 160/66 111/60  Pulse: 75 63  Resp: (!) 21 16  Temp: 97.9 F (36.6 C) (!) 97 F (36.1 C)  SpO2: 98% 95%    Intake/Output Summary (Last 24 hours) at 01/24/17 1033 Last data filed at 01/24/17 0500   Gross per 24 hour  Intake              430 ml  Output                0 ml  Net              430 ml   Exam:  General:  Well appearing elderly male in no acute distress. AxO to name only. Laying comfortably in bed.   Cardiovascular: Regular rate and rhythm, no murmurs, gallops, or rubs. 2+ peripheral pulses  Respiratory: Clear to auscultation bilaterally  Abdomen: Soft, non tender. Regular bowel sounds.  Musculoskeletal: Pain and tenderness to right hip.    Data Reviewed: Basic Metabolic Panel:  Recent Labs Lab 01/23/17 1124 01/23/17 2133  NA 143 140  K 3.6 5.6*  CL 101 102  CO2  --  28  GLUCOSE 87 99  BUN 12 13  CREATININE 0.80 0.95  CALCIUM  --  9.1   Liver Function Tests:  Recent Labs Lab 01/23/17 2133  AST 53*  ALT 17  ALKPHOS 161*  BILITOT 1.5*  PROT 7.7  ALBUMIN 3.9   CBC:  Recent Labs Lab 01/23/17 1124 01/23/17 2001  WBC  --  6.6  NEUTROABS  --  3.9  HGB 12.2* 13.1  HCT 36.0* 38.6*  MCV  --  101.8*  PLT  --  246   Cardiac Enzymes:  Recent Labs Lab 01/24/17 0338  CKTOTAL 226   CBG:  Recent Labs Lab 01/23/17 0948  GLUCAP 72   Studies:  Dg Chest 1 View  Result Date: 01/23/2017 CLINICAL DATA:  Status  post fall. EXAM: CHEST 1 VIEW COMPARISON:  May 01, 2016 FINDINGS: The mediastinal contour is normal. The heart size is mildly enlarged. There is chronic elevation of right hemidiaphragm. There is a small right pleural effusion with mild subjacent atelectasis. The left lung is clear. The bony structures are stable. IMPRESSION: Chronic elevation of the right hemidiaphragm. Small right pleural effusion with mild subjacent atelectasis. Electronically Signed   By: Abelardo Diesel M.D.   On: 01/23/2017 19:59   Dg Tibia/fibula Right  Result Date: 01/23/2017 CLINICAL DATA:  Leg pain, fall from wheelchair EXAM: RIGHT TIBIA AND FIBULA - 2 VIEW COMPARISON:  None. FINDINGS: The study is limited by patient mobility and positioning. No definite acute  displaced fracture or malalignment is seen. There are vascular calcifications. Sock artifact over the foot. IMPRESSION: No definite acute osseous abnormality. Electronically Signed   By: Donavan Foil M.D.   On: 01/23/2017 21:05   Ct Head Wo Contrast  Result Date: 01/23/2017 CLINICAL DATA:  Patient BIB EMS from [facility] Staff reported the patient "got up and took off running out of his wheelchair, and fell after about 10-32ft." Staff reported patient did not hit his head or lose consciousness. Staff reports patient is not on blood thinners. Patient has history of dementia. Patient c/o pain to right leg. Shortening and rotation noted to right lower extremity. Patient is high fall risk and was discharged from [facility] last night for another fall. EXAM: CT HEAD WITHOUT CONTRAST CT CERVICAL SPINE WITHOUT CONTRAST TECHNIQUE: Multidetector CT imaging of the head and cervical spine was performed following the standard protocol without intravenous contrast. Multiplanar CT image reconstructions of the cervical spine were also generated. COMPARISON:  12/22/2016 FINDINGS: CT HEAD FINDINGS Brain: No evidence of acute infarction, hemorrhage, hydrocephalus, extra-axial collection or mass lesion/mass effect. Moderate atrophy and extensive white matter hypoattenuation consistent with advanced chronic microvascular ischemic change, stable from previous day's study. Vascular: No hyperdense vessel or unexpected calcification. Skull: Normal. Negative for fracture or focal lesion. Sinuses/Orbits: No acute abnormality of the globes and orbits. Sinuses and mastoid air cells are clear. Other: None. CT CERVICAL SPINE FINDINGS Alignment: Exaggerated lordosis. Mild degenerative anterolisthesis of C7 on T1. Skull base and vertebrae: No acute fracture. No primary bone lesion or focal pathologic process. Soft tissues and spinal canal: No prevertebral fluid or swelling. No visible canal hematoma. Disc levels: Moderate loss of disc height  at C3-C4. Mild loss of disc height at C4-C5. Marked loss of disc height at C5-C6, C6-C7 and C7-T1. There is spondylotic disc bulging with endplate spurring at these levels. Bilateral facet degenerative changes are noted from C2-C3 through C7-T1. There are varying degrees of neural foraminal narrowing due to facet and uncovertebral spurring. No convincing disc herniation. Upper chest: No acute findings. Other: None. IMPRESSION: HEAD CT 1. No acute intracranial abnormalities. 2. Atrophy and advanced chronic microvascular ischemic change. Stable appearance from the previous day's study. CERVICAL CT 1. No fracture or acute findings. 2. Advanced degenerative changes stable from the prior cervical CT. Electronically Signed   By: Lajean Manes M.D.   On: 01/23/2017 20:40   Ct Cervical Spine Wo Contrast  Result Date: 01/23/2017 CLINICAL DATA:  Patient BIB EMS from [facility]. Staff reported the patient "got up and took off running out of his wheelchair, and fell after about 10-58ft." Staff reported patient did not hit his head or lose consciousness. Staff reports patient is not on blood thinners. Patient has history of dementia. Patient c/o pain to right  leg. Shortening and rotation noted to right lower extremity. Patient is high fall risk and was discharged from [facility] last night for another fall. EXAM: CT HEAD WITHOUT CONTRAST CT CERVICAL SPINE WITHOUT CONTRAST TECHNIQUE: Multidetector CT imaging of the head and cervical spine was performed following the standard protocol without intravenous contrast. Multiplanar CT image reconstructions of the cervical spine were also generated. COMPARISON:  12/22/2016 FINDINGS: CT HEAD FINDINGS Brain: No evidence of acute infarction, hemorrhage, hydrocephalus, extra-axial collection or mass lesion/mass effect. Moderate atrophy and extensive white matter hypoattenuation consistent with advanced chronic microvascular ischemic change, stable from previous day's study. Vascular: No  hyperdense vessel or unexpected calcification. Skull: Normal. Negative for fracture or focal lesion. Sinuses/Orbits: No acute abnormality of the globes and orbits. Sinuses and mastoid air cells are clear. Other: None. CT CERVICAL SPINE FINDINGS Alignment: Exaggerated lordosis. Mild degenerative anterolisthesis of C7 on T1. Skull base and vertebrae: No acute fracture. No primary bone lesion or focal pathologic process. Soft tissues and spinal canal: No prevertebral fluid or swelling. No visible canal hematoma. Disc levels: Moderate loss of disc height at C3-C4. Mild loss of disc height at C4-C5. Marked loss of disc height at C5-C6, C6-C7 and C7-T1. There is spondylotic disc bulging with endplate spurring at these levels. Bilateral facet degenerative changes are noted from C2-C3 through C7-T1. There are varying degrees of neural foraminal narrowing due to facet and uncovertebral spurring. No convincing disc herniation. Upper chest: No acute findings. Other: None. IMPRESSION: HEAD CT 1. No acute intracranial abnormalities. 2. Atrophy and advanced chronic microvascular ischemic change. Stable appearance from the previous day's study. CERVICAL CT 1. No fracture or acute findings. 2. Advanced degenerative changes stable from the prior cervical CT. Electronically Signed   By: Lajean Manes M.D.   On: 01/23/2017 20:40   Dg Knee Complete 4 Views Right  Result Date: 01/23/2017 CLINICAL DATA:  [address] out of wheelchair with pain to the leg EXAM: RIGHT KNEE - COMPLETE 4+ VIEW COMPARISON:  01/23/2017 FINDINGS: Mild to moderate patellofemoral degenerative changes. Mild to moderate medial and lateral degenerative changes. No acute displaced fracture. Vascular calcifications. No large effusion. IMPRESSION: Degenerative changes.  No definite acute osseous abnormality. Electronically Signed   By: Donavan Foil M.D.   On: 01/23/2017 21:02   Dg Hip Unilat  With Pelvis 2-3 Views Right  Result Date: 01/23/2017 CLINICAL DATA:  Status  post fall. EXAM: DG HIP (WITH OR WITHOUT PELVIS) 2-3V RIGHT COMPARISON:  None. FINDINGS: There is deformity of the right proximal femur at the junction of femoral head and neck is consistent with fracture. Degenerative joint changes of the spine are noted. IMPRESSION: Deformity of the right proximal femur at the junction of femoral head and neck is consistent with fracture. Electronically Signed   By: Abelardo Diesel M.D.   On: 01/23/2017 19:58    Scheduled Meds: . [MAR Hold] amLODipine  5 mg Oral Daily  . chlorhexidine  60 mL Topical Once  . [MAR Hold] divalproex  250 mg Oral BID  . [MAR Hold] donepezil  10 mg Oral Daily  . [MAR Hold] escitalopram  10 mg Oral Daily  . [MAR Hold] gabapentin  100 mg Oral BID  . [MAR Hold] lubriderm seriously sensitive  1 application Topical Daily  . [MAR Hold] memantine  10 mg Oral BID  . [MAR Hold] mirtazapine  15 mg Oral QHS  . morphine      . [MAR Hold] multivitamin  1 tablet Oral Daily   Continuous Infusions: .  sodium chloride 75 mL/hr (01/24/17 0641)  . [MAR Hold] tranexamic acid (CYKLOKAPRON) topical -INTRAOP     Time spent: 25 minutes  Minda Ditto, PA-S  [facility] If 7PM-7AM, please contact night-coverage at [email or web address], password [facility] 01/24/2017, 10:33 AM  LOS: 1 day

## 2017-01-24 NOTE — Interval H&P Note (Signed)
History and Physical Interval Note:  01/24/2017 11:23 AM  Jimmy Solis  has presented today for surgery, with the diagnosis of Right Hip Fracture  The various methods of treatment have been discussed with the patient and family. After consideration of risks, benefits and other options for treatment, the patient has consented to  Procedure(s): ARTHROPLASTY BIPOLAR HIP (HEMIARTHROPLASTY) (Right) as a surgical intervention .  The patient's history has been reviewed, patient examined, no change in status, stable for surgery.  I have reviewed the patient's chart and labs.  Questions were answered to the patient's satisfaction.     [name]

## 2017-01-24 NOTE — Anesthesia Postprocedure Evaluation (Signed)
Anesthesia Post Note  Patient: LUQMAN PERRELLI  Procedure(s) Performed: Procedure(s) (LRB): ARTHROPLASTY BIPOLAR HIP (HEMIARTHROPLASTY) (Right)     Patient location during evaluation: PACU Anesthesia Type: General Level of consciousness: awake and alert Pain management: pain level controlled Vital Signs Assessment: post-procedure vital signs reviewed and stable Respiratory status: spontaneous breathing, nonlabored ventilation, respiratory function stable and patient connected to nasal cannula oxygen Cardiovascular status: blood pressure returned to baseline and stable Postop Assessment: no apparent nausea or vomiting Anesthetic complications: no    Last Vitals:  Vitals:   01/24/17 1245 01/24/17 1247  BP:  109/60  Pulse: 61 64  Resp: 13 18  Temp:    SpO2: 98% 100%    Last Pain:  Vitals:   01/24/17 1247  TempSrc:   PainSc: 0-No pain                 Jossue Rubenstein S

## 2017-01-24 NOTE — Consult Note (Signed)
ORTHOPAEDIC CONSULTATION  REQUESTING PHYSICIAN: Ivor Costa, MD  Chief Complaint: fall and right hip pain  Assessment / Plan: Principal Problem:   Closed right hip fracture [facility]) Active Problems:   Fall   Essential hypertension   Dementia with behavioral disturbance   Stroke [facility])   Depression  Right femoral neck fracture Plan for Operative fixation today - Family not at bedside.  Consent may be a challenge as POA uncertain at this time. -NPO -Medicine team to admit and perform pre-op clearance -PT/OT post op -Will ammend WB status postop, bedrest for now -Foley for comfort okay - to be removed POD 1/2 -Likely to return to SNF upon discharge.  -VTE prophylaxis: SCDs   HPI: Jimmy Solis is a 83 y.o. male who complains of right hip pain after reports of several falls at his SNF.  He was seen and discharge from the ED.  Subsequent x-rays shows a right femoral neck fracture. Orthopedics was consulted for evaluation. There is no family bedside, and due to his dementia, the patient is minimally interactive/contributory to history.  Majority of history obtained from EMR.  Past Medical History:  Diagnosis Date  . Abnormality of gait 04/07/2015  . Alzheimers disease   . Blind right eye   . Cancer of skin of back   . 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 Depression    hx  . Falls frequently    "probably once/month" (01/16/2015)  . Prostate cancer ([place])   . Retinal detachment    left  . Stroke [facility]) ~ 12/2014   "lost vision in right eye"  . TIA (transient ischemic attack)    "several over the last couple years; brain scans have reflected that; sees /Dr. Jannifer Franklin" (01/16/2015)   Past Surgical History:  Procedure Laterality Date  . ABDOMINAL HERNIA REPAIR  ~ 2003  . CATARACT EXTRACTION W/ INTRAOCULAR LENS  IMPLANT, BILATERAL Bilateral ~ 2004  . EYE SURGERY    . HERNIA REPAIR    . PROSTATECTOMY  ~ 2001  . RETINAL DETACHMENT SURGERY Left ~ 2006  . SKIN  CANCER EXCISION  ~ 2013   "back"  . TONSILLECTOMY     Social History   Social History  . Marital status: Single    Spouse name: N/A  . Number of children: 0  . Years of education: Master's   Occupational History  . retired    Social History Main Topics  . Smoking status: Never Smoker  . Smokeless tobacco: Never Used  . Alcohol use No  . Drug use: No  . Sexual activity: Not Asked   Other Topics Concern  . None   Social History Narrative   Patient drinks 1 cup of caffeine daily.   Patient is right handed.      Family History  Problem Relation Age of Onset  . Stroke Mother   . Lung cancer Brother   . Tuberculosis Father    Allergies  Allergen Reactions  . Penicillins Hives and Other (See Comments)    Has patient had a PCN reaction causing immediate rash, facial/tongue/throat swelling, SOB or lightheadedness with hypotension: Unsure Has patient had a PCN reaction causing severe rash involving mucus membranes or skin necrosis: Unsure Has patient had a PCN reaction that required hospitalization Unsure Has patient had a PCN reaction occurring within the last 10 years: Unsure If all of the above answers are "NO", then may proceed with Cephalosporin use.  . Ativan [Lorazepam] Other (See Comments)  Reaction:  Hallucinations    Prior to Admission medications   Medication Sig Start Date End Date Taking? Authorizing Provider  acetaminophen (TYLENOL) 500 MG tablet Take 500 mg by mouth every 4 (four) hours as needed for moderate pain.    [provider]  alum & mag hydroxide-simeth ([place]) 200-200-20 MG/5ML suspension Take 30 mLs by mouth every 6 (six) hours as needed for indigestion or heartburn.    [provider]  amLODipine (NORVASC) 5 MG tablet Take 1 tablet (5 mg total) by mouth daily. 01/23/15   Donne Hazel, MD  aspirin EC 81 MG tablet Take 81 mg by mouth daily.    [provider]  divalproex (DEPAKOTE) 250 MG DR tablet Take 250 mg by  mouth 2 (two) times daily.    [provider]  donepezil (ARICEPT) 10 MG tablet Take 10 mg by mouth daily.     [provider]  Emollient (LUBRIDERM SERIOUSLY SENSITIVE) LOTN Apply 1 application topically daily.    [provider]  escitalopram (LEXAPRO) 10 MG tablet TAKE 1 TABLET BY MOUTH DAILY Patient taking differently: TAKE 10 MG BY MOUTH DAILY 05/04/16   Kathrynn Ducking, MD  gabapentin (NEURONTIN) 100 MG capsule Take 100 mg by mouth 2 (two) times daily.    [provider]  guaifenesin (ROBITUSSIN) 100 MG/5ML syrup Take 200 mg by mouth every 6 (six) hours as needed for cough.    [provider]  loperamide (IMODIUM A-D) 2 MG tablet Take 2 mg by mouth 4 (four) times daily as needed for diarrhea or loose stools.    [provider]  magnesium hydroxide (MILK OF MAGNESIA) 400 MG/5ML suspension Take 30 mLs by mouth at bedtime as needed for mild constipation.    [provider]  memantine (NAMENDA) 10 MG tablet TAKE 1 TABLET (10 MG TOTAL) BY MOUTH 2 (TWO) TIMES DAILY. 04/19/16   Ward Givens, NP  mirtazapine (REMERON) 15 MG tablet Take 15 mg by mouth at bedtime.    [provider]  Multiple Vitamins-Minerals (PRESERVISION AREDS 2 PO) Take 1 capsule by mouth 2 (two) times daily.    [provider]  neomycin-bacitracin-polymyxin (NEOSPORIN) [phone] ointment Apply 1 application topically daily as needed (skin tear). Clean area with normal saline, apply neosporin and cover with band-air or guaze and tape change as needed until healed.    [provider]  traZODone (DESYREL) 50 MG tablet Take 50 mg by mouth at bedtime as needed for sleep.     [provider]  traZODone (DESYREL) 50 MG tablet Take 25 mg by mouth at bedtime as needed (agitation).    [provider]   Dg Chest 1 View  Result Date: 01/23/2017 CLINICAL DATA:  Status post fall. EXAM: CHEST 1 VIEW COMPARISON:  May 01, 2016  FINDINGS: The mediastinal contour is normal. The heart size is mildly enlarged. There is chronic elevation of right hemidiaphragm. There is a small right pleural effusion with mild subjacent atelectasis. The left lung is clear. The bony structures are stable. IMPRESSION: Chronic elevation of the right hemidiaphragm. Small right pleural effusion with mild subjacent atelectasis. Electronically Signed   By: Abelardo Diesel M.D.   On: 01/23/2017 19:59   Dg Tibia/fibula Right  Result Date: 01/23/2017 CLINICAL DATA:  Leg pain, fall from wheelchair EXAM: RIGHT TIBIA AND FIBULA - 2 VIEW COMPARISON:  None. FINDINGS: The study is limited by patient mobility and positioning. No definite acute displaced fracture or malalignment is seen. There  are vascular calcifications. Sock artifact over the foot. IMPRESSION: No definite acute osseous abnormality. Electronically Signed   By: Donavan Foil M.D.   On: 01/23/2017 21:05   Ct Head Wo Contrast  Result Date: 01/23/2017 CLINICAL DATA:  Patient BIB EMS from [facility]. Staff reported the patient "got up and took off running out of his wheelchair, and fell after about 10-44f." Staff reported patient did not hit his head or lose consciousness. Staff reports patient is not on blood thinners. Patient has history of dementia. Patient c/o pain to right leg. Shortening and rotation noted to right lower extremity. Patient is high fall risk and [facility] EXAM: CT HEAD WITHOUT CONTRAST CT CERVICAL SPINE WITHOUT CONTRAST TECHNIQUE: Multidetector CT imaging of the head and cervical spine was performed following the standard protocol without intravenous contrast. Multiplanar CT image reconstructions of the cervical spine were also generated. COMPARISON:  12/22/2016 FINDINGS: CT HEAD FINDINGS Brain: No evidence of acute infarction, hemorrhage, hydrocephalus, extra-axial collection or mass lesion/mass effect. Moderate atrophy and extensive  white matter hypoattenuation consistent with advanced chronic microvascular ischemic change, stable from previous day's study. Vascular: No hyperdense vessel or unexpected calcification. Skull: Normal. Negative for fracture or focal lesion. Sinuses/Orbits: No acute abnormality of the globes and orbits. Sinuses and mastoid air cells are clear. Other: None. CT CERVICAL SPINE FINDINGS Alignment: Exaggerated lordosis. Mild degenerative anterolisthesis of C7 on T1. Skull base and vertebrae: No acute fracture. No primary bone lesion or focal pathologic process. Soft tissues and spinal canal: No prevertebral fluid or swelling. No visible canal hematoma. Disc levels: Moderate loss of disc height at C3-C4. Mild loss of disc height at C4-C5. Marked loss of disc height at C5-C6, C6-C7 and C7-T1. There is spondylotic disc bulging with endplate spurring at these levels. Bilateral facet degenerative changes are noted from C2-C3 through C7-T1. There are varying degrees of neural foraminal narrowing due to facet and uncovertebral spurring. No convincing disc herniation. Upper chest: No acute findings. Other: None. IMPRESSION: HEAD CT 1. No acute intracranial abnormalities. 2. Atrophy and advanced chronic microvascular ischemic change. Stable appearance from the previous day's study. CERVICAL CT 1. No fracture or acute findings. 2. Advanced degenerative changes stable from the prior cervical CT. Electronically Signed   By: DLajean ManesM.D.   On: 01/23/2017 20:40   Ct Cervical Spine Wo Contrast  Result Date: 01/23/2017 CLINICAL DATA:  [facility] reported the patient "got up and took off running out of his wheelchair, and fell after about 10-145f" Staff reported patient did not hit his head or lose consciousness. Staff reports patient is not on blood thinners. Patient has history of dementia. Patient c/o pain to right leg. Shortening and rotation noted to right lower extremity. Patient is high  fall risk and [facility] EXAM: CT HEAD WITHOUT CONTRAST CT CERVICAL SPINE WITHOUT CONTRAST TECHNIQUE: Multidetector CT imaging of the head and cervical spine was performed following the standard protocol without intravenous contrast. Multiplanar CT image reconstructions of the cervical spine were also generated. COMPARISON:  12/22/2016 FINDINGS: CT HEAD FINDINGS Brain: No evidence of acute infarction, hemorrhage, hydrocephalus, extra-axial collection or mass lesion/mass effect. Moderate atrophy and extensive white matter hypoattenuation consistent with advanced chronic microvascular ischemic change, stable from previous day's study. Vascular: No hyperdense vessel or unexpected calcification. Skull: Normal. Negative for fracture or focal lesion. Sinuses/Orbits: No acute abnormality of the globes and orbits.  Sinuses and mastoid air cells are clear. Other: None. CT CERVICAL SPINE FINDINGS Alignment: Exaggerated lordosis. Mild degenerative anterolisthesis of C7 on T1. Skull base and vertebrae: No acute fracture. No primary bone lesion or focal pathologic process. Soft tissues and spinal canal: No prevertebral fluid or swelling. No visible canal hematoma. Disc levels: Moderate loss of disc height at C3-C4. Mild loss of disc height at C4-C5. Marked loss of disc height at C5-C6, C6-C7 and C7-T1. There is spondylotic disc bulging with endplate spurring at these levels. Bilateral facet degenerative changes are noted from C2-C3 through C7-T1. There are varying degrees of neural foraminal narrowing due to facet and uncovertebral spurring. No convincing disc herniation. Upper chest: No acute findings. Other: None. IMPRESSION: HEAD CT 1. No acute intracranial abnormalities. 2. Atrophy and advanced chronic microvascular ischemic change. Stable appearance from the previous day's study. CERVICAL CT 1. No fracture or acute findings. 2. Advanced degenerative changes stable from the prior  cervical CT. Electronically Signed   By: Lajean Manes M.D.   On: 01/23/2017 20:40   Dg Knee Complete 4 Views Right  Result Date: 01/23/2017 CLINICAL DATA:  [place] [address] out of wheelchair with pain to the leg EXAM: RIGHT KNEE - COMPLETE 4+ VIEW COMPARISON:  01/23/2017 FINDINGS: Mild to moderate patellofemoral degenerative changes. Mild to moderate medial and lateral degenerative changes. No acute displaced fracture. Vascular calcifications. No large effusion. IMPRESSION: Degenerative changes.  No definite acute osseous abnormality. Electronically Signed   By: Donavan Foil M.D.   On: 01/23/2017 21:02   Dg Hip Unilat  With Pelvis 2-3 Views Right  Result Date: 01/23/2017 CLINICAL DATA:  Status post fall. EXAM: DG HIP (WITH OR WITHOUT PELVIS) 2-3V RIGHT COMPARISON:  None. FINDINGS: There is deformity of the right proximal femur at the junction of femoral head and neck is consistent with fracture. Degenerative joint changes of the spine are noted. IMPRESSION: Deformity of the right proximal femur at the junction of femoral head and neck is consistent with fracture. Electronically Signed   By: Abelardo Diesel M.D.   On: 01/23/2017 19:58    ROS: unable to obtain due to dementia  Objective: Labs cbc  Recent Labs  01/23/17 1124 01/23/17 2001  WBC  --  6.6  HGB 12.2* 13.1  HCT 36.0* 38.6*  PLT  --  246    Labs inflam No results for input(s): CRP in the last 72 hours.  Invalid input(s): ESR  Labs coag  Recent Labs  01/23/17 2348  INR 0.96     Recent Labs  01/23/17 1124 01/23/17 2133  NA 143 140  K 3.6 5.6*  CL 101 102  CO2  --  28  GLUCOSE 87 99  BUN 12 13  CREATININE 0.80 0.95  CALCIUM  --  9.1    Physical Exam: Vitals:   01/24/17 0205 01/24/17 0544  BP: (!) 160/66 111/60  Pulse: 75 63  Resp: (!) 21 16  Temp: 97.9 F (36.6 C) (!) 97 F (36.1 C)  SpO2: 98% 95%   General: Alert, no acute distress. Supine in bed. Mental status: Alert and not oriented Neurologic: Speech  Clear.  No focal findings or movement disorder appreciated. Respiratory: No cyanosis, no use of accessory musculature Cardiovascular: No pedal edema GI: Abdomen is soft and non-tender, non-distended. Skin: Warm and dry.  No lesions in the area of chief complaint  Extremities: Warm and well perfused w/o edema Psychiatric: Patient is not competent for consent  MUSCULOSKELETAL:  Right hip pain with range  of motion. No lesions or ecchymosis. Wiggles toes and reports sensation distally. Other extremities are atraumatic with painless ROM and NVI.   Prudencio Burly III PA-C 01/24/2017 6:55 AM

## 2017-01-24 NOTE — Clinical Social Work Note (Signed)
Clinical Social Work Assessment  Patient Details  Name: Jimmy Solis MRN: [id number] Date of Birth: 02-Apr-1929  Date of referral:  01/24/17               Reason for consult:  Facility Placement                Permission sought to share information with:    Permission granted to share information::  Yes, Verbal Permission Granted  Name::     Mr. an Mrs. Levada Dy, Legal guardian  Agency::  [facility] [facility]  Relationship::     Contact Information:     Housing/Transportation Living arrangements for the past 2 months:  [place] of Information:  Guardian Patient Interpreter Needed:  None Criminal Activity/Legal Involvement Pertinent to Current Situation/Hospitalization:  No - Comment as needed Significant Relationships:  Other(Comment) (Guardians) Lives with:  Facility Resident Do you feel safe going back to the place where you live?  No Need for family participation in patient care:  Yes (Comment)  Care giving concerns:  Patient from [facility], Memory Care/ALF and had a fall in facility. Pt has Dementia and Alzheimers. Guardian at bedside to discuss disposition. Guardian in agreement with SNF placement and prefers  area for short term. Discussed at length following up on long term facility placement closer to them in [place].  They have started the process already.   Social Worker assessment / plan:  CSW discussed disposition and will assist with transition to SNF. CSW awaiting PT evaluation to update FL2 and send out to [place] area.  FL2-pending, passr confirmed. Offers pending.  Employment status:  Retired Nurse, adult PT Recommendations:  [place] / Referral to community resources:  [place]  Patient/Family's Response to care:  Guardian appreciative of CSW assistance with SNF process. No issues identified at this time.  Patient/Family's Understanding of and Emotional  Response to Diagnosis, Current Treatment, and Prognosis:  Guardian has good understanding of diagnosis, current treatment and prognosis. They are hopeful that patient sacral wounds will improve and he can ambulate better with short term rehab. No issues at this time.  Emotional Assessment Appearance:  Appears older than stated age Attitude/Demeanor/Rapport:   (Cooperative ) Affect (typically observed):  Accepting, Appropriate Orientation:  Oriented to Self Alcohol / Substance use:  Not Applicable Psych involvement (Current and /or in the community):  No (Comment)  Discharge Needs  Concerns to be addressed:  Care Coordination Readmission within the last 30 days:  Yes Current discharge risk:  Dependent with Mobility, Physical Impairment Barriers to Discharge:  No Barriers Identified   Normajean Baxter, LCSW 01/24/2017, 4:17 PM

## 2017-01-24 NOTE — Progress Notes (Signed)
Initial Nutrition Assessment  DOCUMENTATION CODES:   Not applicable  INTERVENTION:  Once diet advances, provides Ensure Enlive po BID, each supplement provides 350 kcal and 20 grams of protein.  NUTRITION DIAGNOSIS:   Increased nutrient needs related to  (post op healing) as evidenced by estimated needs.  GOAL:   Patient will meet greater than or equal to 90% of their needs  MONITOR:   Weight trends, Supplement acceptance, Diet advancement, Labs, Skin, I & O's  REASON FOR ASSESSMENT:   Consult Assessment of nutrition requirement/status  ASSESSMENT:   83 y.o. male with medical history significant of dementia with behavioral disturbance, hypertension, stroke, prostate cancer, depression, central retinal artery occlusion, right eye blindness, abnormal gait, who presents with fall and right hip pain. Deformity of the right proximal femur at the junction of femoral head and neck is consistent with fracture  PROCEDURE (9/18): ARTHROPLASTY BIPOLAR HIP (HEMIARTHROPLASTY)  Pt currently in OR. RD unable to obtain nutrition history. Pt with no weight loss per weight records. RD to order nutritional supplements to aid post op healing. Nursing staff to provide once diet advances. Unable to complete Nutrition-Focused physical exam at this time.   Labs and medications reviewed. Potassium elevated at 5.6.  Diet Order:  Diet regular Room service appropriate? Yes; Fluid consistency: Thin  Skin:   (Incision R hip)  Last BM:  Unknown  Height:   Ht Readings from Last 1 Encounters:  01/23/17 5\' 8"  (1.727 m)    Weight:   Wt Readings from Last 1 Encounters:  01/23/17 160 lb (72.6 kg)    Ideal Body Weight:  63.6 kg  BMI:  There is no height or weight on file to calculate BMI.  Estimated Nutritional Needs:   Kcal:  1700-1850  Protein:  75-85 grams  Fluid:  1.7 - 1.8 L/day  EDUCATION NEEDS:   No education needs identified at this time  Corrin Parker, MS, RD, LDN Pager #  [phone] After hours/ weekend pager # [phone]

## 2017-01-24 NOTE — H&P (View-Only) (Signed)
ORTHOPAEDIC CONSULTATION  REQUESTING PHYSICIAN: Ivor Costa, MD  Chief Complaint: fall and right hip pain  Assessment / Plan: Principal Problem:   Closed right hip fracture [facility]) Active Problems:   Fall   Essential hypertension   Dementia with behavioral disturbance   Stroke [facility])   Depression  Right femoral neck fracture Plan for Operative fixation today - Family not at bedside.  Consent may be a challenge as POA uncertain at this time. -NPO -Medicine team to admit and perform pre-op clearance -PT/OT post op -Will ammend WB status postop, bedrest for now -Foley for comfort okay - to be removed POD 1/2 -Likely to return to SNF upon discharge.  -VTE prophylaxis: SCDs   HPI: Jimmy Solis is a 83 y.o. male who complains of right hip pain after reports of several falls at his SNF.  He was seen and discharge from the ED.  Subsequent x-rays shows a right femoral neck fracture. Orthopedics was consulted for evaluation. There is no family bedside, and due to his dementia, the patient is minimally interactive/contributory to history.  Majority of history obtained from EMR.  Past Medical History:  Diagnosis Date  . Abnormality of gait 04/07/2015  . Alzheimers disease   . Blind right eye   . Cancer of skin of back   . 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 Depression    hx  . Falls frequently    "probably once/month" (01/16/2015)  . Prostate cancer ([place])   . Retinal detachment    left  . Stroke [facility]) ~ 12/2014   "lost vision in right eye"  . TIA (transient ischemic attack)    "several over the last couple years; brain scans have reflected that; sees /Dr. Jannifer Franklin" (01/16/2015)   Past Surgical History:  Procedure Laterality Date  . ABDOMINAL HERNIA REPAIR  ~ 2003  . CATARACT EXTRACTION W/ INTRAOCULAR LENS  IMPLANT, BILATERAL Bilateral ~ 2004  . EYE SURGERY    . HERNIA REPAIR    . PROSTATECTOMY  ~ 2001  . RETINAL DETACHMENT SURGERY Left ~ 2006  . SKIN  CANCER EXCISION  ~ 2013   "back"  . TONSILLECTOMY     Social History   Social History  . Marital status: Single    Spouse name: N/A  . Number of children: 0  . Years of education: Master's   Occupational History  . retired    Social History Main Topics  . Smoking status: Never Smoker  . Smokeless tobacco: Never Used  . Alcohol use No  . Drug use: No  . Sexual activity: Not Asked   Other Topics Concern  . None   Social History Narrative   Patient drinks 1 cup of caffeine daily.   Patient is right handed.      Family History  Problem Relation Age of Onset  . Stroke Mother   . Lung cancer Brother   . Tuberculosis Father    Allergies  Allergen Reactions  . Penicillins Hives and Other (See Comments)    Has patient had a PCN reaction causing immediate rash, facial/tongue/throat swelling, SOB or lightheadedness with hypotension: Unsure Has patient had a PCN reaction causing severe rash involving mucus membranes or skin necrosis: Unsure Has patient had a PCN reaction that required hospitalization Unsure Has patient had a PCN reaction occurring within the last 10 years: Unsure If all of the above answers are "NO", then may proceed with Cephalosporin use.  . Ativan [Lorazepam] Other (See Comments)  Reaction:  Hallucinations    Prior to Admission medications   Medication Sig Start Date End Date Taking? Authorizing Provider  acetaminophen (TYLENOL) 500 MG tablet Take 500 mg by mouth every 4 (four) hours as needed for moderate pain.    [provider]  alum & mag hydroxide-simeth ([place]) 200-200-20 MG/5ML suspension Take 30 mLs by mouth every 6 (six) hours as needed for indigestion or heartburn.    [provider]  amLODipine (NORVASC) 5 MG tablet Take 1 tablet (5 mg total) by mouth daily. 01/23/15   Donne Hazel, MD  aspirin EC 81 MG tablet Take 81 mg by mouth daily.    [provider]  divalproex (DEPAKOTE) 250 MG DR tablet Take 250 mg by  mouth 2 (two) times daily.    [provider]  donepezil (ARICEPT) 10 MG tablet Take 10 mg by mouth daily.     [provider]  Emollient (LUBRIDERM SERIOUSLY SENSITIVE) LOTN Apply 1 application topically daily.    [provider]  escitalopram (LEXAPRO) 10 MG tablet TAKE 1 TABLET BY MOUTH DAILY Patient taking differently: TAKE 10 MG BY MOUTH DAILY 05/04/16   Kathrynn Ducking, MD  gabapentin (NEURONTIN) 100 MG capsule Take 100 mg by mouth 2 (two) times daily.    [provider]  guaifenesin (ROBITUSSIN) 100 MG/5ML syrup Take 200 mg by mouth every 6 (six) hours as needed for cough.    [provider]  loperamide (IMODIUM A-D) 2 MG tablet Take 2 mg by mouth 4 (four) times daily as needed for diarrhea or loose stools.    [provider]  magnesium hydroxide (MILK OF MAGNESIA) 400 MG/5ML suspension Take 30 mLs by mouth at bedtime as needed for mild constipation.    [provider]  memantine (NAMENDA) 10 MG tablet TAKE 1 TABLET (10 MG TOTAL) BY MOUTH 2 (TWO) TIMES DAILY. 04/19/16   Ward Givens, NP  mirtazapine (REMERON) 15 MG tablet Take 15 mg by mouth at bedtime.    [provider]  Multiple Vitamins-Minerals (PRESERVISION AREDS 2 PO) Take 1 capsule by mouth 2 (two) times daily.    [provider]  neomycin-bacitracin-polymyxin (NEOSPORIN) [phone] ointment Apply 1 application topically daily as needed (skin tear). Clean area with normal saline, apply neosporin and cover with band-air or guaze and tape change as needed until healed.    [provider]  traZODone (DESYREL) 50 MG tablet Take 50 mg by mouth at bedtime as needed for sleep.     [provider]  traZODone (DESYREL) 50 MG tablet Take 25 mg by mouth at bedtime as needed (agitation).    [provider]   Dg Chest 1 View  Result Date: 01/23/2017 CLINICAL DATA:  Status post fall. EXAM: CHEST 1 VIEW COMPARISON:  May 01, 2016  FINDINGS: The mediastinal contour is normal. The heart size is mildly enlarged. There is chronic elevation of right hemidiaphragm. There is a small right pleural effusion with mild subjacent atelectasis. The left lung is clear. The bony structures are stable. IMPRESSION: Chronic elevation of the right hemidiaphragm. Small right pleural effusion with mild subjacent atelectasis. Electronically Signed   By: Abelardo Diesel M.D.   On: 01/23/2017 19:59   Dg Tibia/fibula Right  Result Date: 01/23/2017 CLINICAL DATA:  Leg pain, fall from wheelchair EXAM: RIGHT TIBIA AND FIBULA - 2 VIEW COMPARISON:  None. FINDINGS: The study is limited by patient mobility and positioning. No definite acute displaced fracture or malalignment is seen. There  are vascular calcifications. Sock artifact over the foot. IMPRESSION: No definite acute osseous abnormality. Electronically Signed   By: Donavan Foil M.D.   On: 01/23/2017 21:05   Ct Head Wo Contrast  Result Date: 01/23/2017 CLINICAL DATA:  Patient BIB EMS from [facility]. Staff reported the patient "got up and took off running out of his wheelchair, and fell after about 10-44f." Staff reported patient did not hit his head or lose consciousness. Staff reports patient is not on blood thinners. Patient has history of dementia. Patient c/o pain to right leg. Shortening and rotation noted to right lower extremity. Patient is high fall risk and [facility] EXAM: CT HEAD WITHOUT CONTRAST CT CERVICAL SPINE WITHOUT CONTRAST TECHNIQUE: Multidetector CT imaging of the head and cervical spine was performed following the standard protocol without intravenous contrast. Multiplanar CT image reconstructions of the cervical spine were also generated. COMPARISON:  12/22/2016 FINDINGS: CT HEAD FINDINGS Brain: No evidence of acute infarction, hemorrhage, hydrocephalus, extra-axial collection or mass lesion/mass effect. Moderate atrophy and extensive  white matter hypoattenuation consistent with advanced chronic microvascular ischemic change, stable from previous day's study. Vascular: No hyperdense vessel or unexpected calcification. Skull: Normal. Negative for fracture or focal lesion. Sinuses/Orbits: No acute abnormality of the globes and orbits. Sinuses and mastoid air cells are clear. Other: None. CT CERVICAL SPINE FINDINGS Alignment: Exaggerated lordosis. Mild degenerative anterolisthesis of C7 on T1. Skull base and vertebrae: No acute fracture. No primary bone lesion or focal pathologic process. Soft tissues and spinal canal: No prevertebral fluid or swelling. No visible canal hematoma. Disc levels: Moderate loss of disc height at C3-C4. Mild loss of disc height at C4-C5. Marked loss of disc height at C5-C6, C6-C7 and C7-T1. There is spondylotic disc bulging with endplate spurring at these levels. Bilateral facet degenerative changes are noted from C2-C3 through C7-T1. There are varying degrees of neural foraminal narrowing due to facet and uncovertebral spurring. No convincing disc herniation. Upper chest: No acute findings. Other: None. IMPRESSION: HEAD CT 1. No acute intracranial abnormalities. 2. Atrophy and advanced chronic microvascular ischemic change. Stable appearance from the previous day's study. CERVICAL CT 1. No fracture or acute findings. 2. Advanced degenerative changes stable from the prior cervical CT. Electronically Signed   By: DLajean ManesM.D.   On: 01/23/2017 20:40   Ct Cervical Spine Wo Contrast  Result Date: 01/23/2017 CLINICAL DATA:  [facility] reported the patient "got up and took off running out of his wheelchair, and fell after about 10-145f" Staff reported patient did not hit his head or lose consciousness. Staff reports patient is not on blood thinners. Patient has history of dementia. Patient c/o pain to right leg. Shortening and rotation noted to right lower extremity. Patient is high  fall risk [facility] EXAM: CT HEAD WITHOUT CONTRAST CT CERVICAL SPINE WITHOUT CONTRAST TECHNIQUE: Multidetector CT imaging of the head and cervical spine was performed following the standard protocol without intravenous contrast. Multiplanar CT image reconstructions of the cervical spine were also generated. COMPARISON:  12/22/2016 FINDINGS: CT HEAD FINDINGS Brain: No evidence of acute infarction, hemorrhage, hydrocephalus, extra-axial collection or mass lesion/mass effect. Moderate atrophy and extensive white matter hypoattenuation consistent with advanced chronic microvascular ischemic change, stable from previous day's study. Vascular: No hyperdense vessel or unexpected calcification. Skull: Normal. Negative for fracture or focal lesion. Sinuses/Orbits: No acute abnormality of the globes and orbits.  Sinuses and mastoid air cells are clear. Other: None. CT CERVICAL SPINE FINDINGS Alignment: Exaggerated lordosis. Mild degenerative anterolisthesis of C7 on T1. Skull base and vertebrae: No acute fracture. No primary bone lesion or focal pathologic process. Soft tissues and spinal canal: No prevertebral fluid or swelling. No visible canal hematoma. Disc levels: Moderate loss of disc height at C3-C4. Mild loss of disc height at C4-C5. Marked loss of disc height at C5-C6, C6-C7 and C7-T1. There is spondylotic disc bulging with endplate spurring at these levels. Bilateral facet degenerative changes are noted from C2-C3 through C7-T1. There are varying degrees of neural foraminal narrowing due to facet and uncovertebral spurring. No convincing disc herniation. Upper chest: No acute findings. Other: None. IMPRESSION: HEAD CT 1. No acute intracranial abnormalities. 2. Atrophy and advanced chronic microvascular ischemic change. Stable appearance from the previous day's study. CERVICAL CT 1. No fracture or acute findings. 2. Advanced degenerative changes stable from the prior  cervical CT. Electronically Signed   By: Lajean Manes M.D.   On: 01/23/2017 20:40   Dg Knee Complete 4 Views Right  Result Date: 01/23/2017 CLINICAL DATA:  [address] out of wheelchair with pain to the leg EXAM: RIGHT KNEE - COMPLETE 4+ VIEW COMPARISON:  01/23/2017 FINDINGS: Mild to moderate patellofemoral degenerative changes. Mild to moderate medial and lateral degenerative changes. No acute displaced fracture. Vascular calcifications. No large effusion. IMPRESSION: Degenerative changes.  No definite acute osseous abnormality. Electronically Signed   By: Donavan Foil M.D.   On: 01/23/2017 21:02   Dg Hip Unilat  With Pelvis 2-3 Views Right  Result Date: 01/23/2017 CLINICAL DATA:  Status post fall. EXAM: DG HIP (WITH OR WITHOUT PELVIS) 2-3V RIGHT COMPARISON:  None. FINDINGS: There is deformity of the right proximal femur at the junction of femoral head and neck is consistent with fracture. Degenerative joint changes of the spine are noted. IMPRESSION: Deformity of the right proximal femur at the junction of femoral head and neck is consistent with fracture. Electronically Signed   By: Abelardo Diesel M.D.   On: 01/23/2017 19:58    ROS: unable to obtain due to dementia  Objective: Labs cbc  Recent Labs  01/23/17 1124 01/23/17 2001  WBC  --  6.6  HGB 12.2* 13.1  HCT 36.0* 38.6*  PLT  --  246    Labs inflam No results for input(s): CRP in the last 72 hours.  Invalid input(s): ESR  Labs coag  Recent Labs  01/23/17 2348  INR 0.96     Recent Labs  01/23/17 1124 01/23/17 2133  NA 143 140  K 3.6 5.6*  CL 101 102  CO2  --  28  GLUCOSE 87 99  BUN 12 13  CREATININE 0.80 0.95  CALCIUM  --  9.1    Physical Exam: Vitals:   01/24/17 0205 01/24/17 0544  BP: (!) 160/66 111/60  Pulse: 75 63  Resp: (!) 21 16  Temp: 97.9 F (36.6 C) (!) 97 F (36.1 C)  SpO2: 98% 95%   General: Alert, no acute distress. Supine in bed. Mental status: Alert and not oriented Neurologic: Speech  Clear.  No focal findings or movement disorder appreciated. Respiratory: No cyanosis, no use of accessory musculature Cardiovascular: No pedal edema GI: Abdomen is soft and non-tender, non-distended. Skin: Warm and dry.  No lesions in the area of chief complaint  Extremities: Warm and well perfused w/o edema Psychiatric: Patient is not competent for consent  MUSCULOSKELETAL:  Right hip pain with range  of motion. No lesions or ecchymosis. Wiggles toes and reports sensation distally. Other extremities are atraumatic with painless ROM and NVI.   Prudencio Burly III PA-C 01/24/2017 6:55 AM

## 2017-01-25 ENCOUNTER — Encounter (HOSPITAL_COMMUNITY): Payer: Self-pay | Admitting: Orthopedic Surgery

## 2017-01-25 DIAGNOSIS — I639 Cerebral infarction, unspecified: Secondary | ICD-10-CM

## 2017-01-25 LAB — CBC
HCT: 31.5 % — ABNORMAL LOW (ref 39.0–52.0)
HEMOGLOBIN: 10.4 g/dL — AB (ref 13.0–17.0)
MCH: 33.7 pg (ref 26.0–34.0)
MCHC: 33 g/dL (ref 30.0–36.0)
MCV: 101.9 fL — ABNORMAL HIGH (ref 78.0–100.0)
PLATELETS: 209 10*3/uL (ref 150–400)
RBC: 3.09 MIL/uL — ABNORMAL LOW (ref 4.22–5.81)
RDW: 14.6 % (ref 11.5–15.5)
WBC: 8.5 10*3/uL (ref 4.0–10.5)

## 2017-01-25 LAB — BASIC METABOLIC PANEL
Anion gap: 8 (ref 5–15)
BUN: 10 mg/dL (ref 6–20)
CALCIUM: 8.4 mg/dL — AB (ref 8.9–10.3)
CHLORIDE: 104 mmol/L (ref 101–111)
CO2: 25 mmol/L (ref 22–32)
CREATININE: 0.93 mg/dL (ref 0.61–1.24)
GFR calc non Af Amer: >60 mL/min (ref >60)
Glucose, Bld: 119 mg/dL — ABNORMAL HIGH (ref 65–99)
Potassium: 4.7 mmol/L (ref 3.5–5.1)
SODIUM: 137 mmol/L (ref 135–145)

## 2017-01-25 MED ORDER — ENSURE ENLIVE PO LIQD
237.0000 mL | Freq: Four times a day (QID) | ORAL | Status: DC
  Administered 2017-01-25 – 2017-01-29 (×13): 237 mL via ORAL

## 2017-01-25 MED ORDER — HYDROCODONE-ACETAMINOPHEN 5-325 MG PO TABS: 1.0000 | ORAL_TABLET | Freq: Four times a day (QID) | ORAL | Status: DC | PRN

## 2017-01-25 MED ORDER — RESOURCE THICKENUP CLEAR PO POWD
ORAL | Status: DC | PRN
  Filled 2017-01-25: qty 125

## 2017-01-25 MED ORDER — SENNOSIDES-DOCUSATE SODIUM 8.6-50 MG PO TABS: 1.0000 | ORAL_TABLET | Freq: Every evening | ORAL | Status: AC | PRN

## 2017-01-25 NOTE — Progress Notes (Signed)
   Assessment / Plan: 1 Day Post-Op  S/P Procedure(s) (LRB): ARTHROPLASTY BIPOLAR HIP (HEMIARTHROPLASTY) (Right) by Dr. Ernesta Amble. Percell Miller on 01/24/17  Principal Problem:   Closed right hip fracture [facility]) Active Problems:   Fall   Essential hypertension   Dementia with behavioral disturbance   Stroke [facility])   Depression  Right Femoral Neck Fracture s/p Hemiarthroplasty Posterior Hip precautions Minimize Narcotic medicines in favor of Tylenol if able  Mobilize with therapy Incentive Spirometry Apply ice prn  Weight Bearing: Weight Bearing as Tolerated (WBAT) RLE, posterior hip precautions Dressings: PRN Mepilex.  VTE prophylaxis: Lovenox, SCDs, ambulation Dispo: Per primary.  SNF likely when stable medically.  Subjective: Patient minimally interactive dt dementia.  He does report some pain around his right hip.  Objective:   VITALS:   Vitals:   01/24/17 1247 01/24/17 2143 01/25/17 0058 01/25/17 0353  BP: 109/60 114/74 (!) 114/52 (!) 144/57  Pulse: 64  76 87  Resp: 18 19 16 18   Temp:  98.6 F (37 C) 98.4 F (36.9 C) 98.7 F (37.1 C)  TempSrc:  Oral Oral Oral  SpO2: 100% 93% 96% 96%   CBC Latest Ref Rng & Units 01/23/2017 01/23/2017 05/01/2016  WBC 4.0 - 10.5 K/uL 6.6 - 6.4  Hemoglobin 13.0 - 17.0 g/dL 13.1 12.2(L) 11.0(L)  Hematocrit 39.0 - 52.0 % 38.6(L) 36.0(L) 32.8(L)  Platelets 150 - 400 K/uL 246 - 232   BMP Latest Ref Rng & Units 01/25/2017 01/23/2017 01/23/2017  Glucose 65 - 99 mg/dL 119(H) 99 87  BUN 6 - 20 mg/dL 10 13 12   Creatinine 0.61 - 1.24 mg/dL 0.93 0.95 0.80  Sodium 135 - 145 mmol/L 137 140 143  Potassium 3.5 - 5.1 mmol/L 4.7 5.6(H) 3.6  Chloride 101 - 111 mmol/L 104 102 101  CO2 22 - 32 mmol/L 25 28 -  Calcium 8.9 - 10.3 mg/dL 8.4(L) 9.1 -   Intake/Output      09/18 0701 - 09/19 0700 09/19 0701 - 09/20 0700   I.V. 1080    Total Intake 1080     Blood 100    Total Output 100     Net +980          Urine Occurrence 1 x       Physical  Exam: General: NAD.  Supine in bed.  No increased WOB. MSK Neurovascularly intact Sensation intact distally Feet warm Dorsiflexion/Plantar flexion intact Incision: dressing C/D/I   Prudencio Burly III, PA-C 01/25/2017, 8:01 AM

## 2017-01-25 NOTE — Progress Notes (Addendum)
PROGRESS NOTE    Jimmy Solis   WPY:[id number]  DOB: 29-Oct-1928  DOA: 01/23/2017 PCP: Cyndy Freeze, MD   Brief Narrative:  Jimmy Solis is a 83 y.o. male from SNF with medical history significant of dementia with behavioral disturbance, hypertension, stroke, prostate cancer, depression, central retinal artery occlusion, right eye blindness, abnormal gait, who presents with fall and right hip pain. Per report, pt fell twice in SNF. Pt was seen and discharged from ED 9/17 morning after being evaluated for fall. Staff reports that the patient got up and ran out of his wheelchair and fell after about 10-15 feet. He seems to have right hip pain.  Subjective: Only able to say his name and does not answer questions.  Assessment & Plan:   Principal Problem:   Closed right hip fracture -   Fall - s/p hemiarthroplasty - will return to SNF - Lovenox for DVT prophylaxis per Ortho  Active Problems:   Essential hypertension - Amlodipine    Dementia with behavioral disturbance - Depakote, Aricept, namenda, Remeron    Stroke  - ASA 81 mg    DVT prophylaxis: Lovenox Code Status: Full code Family Communication:  Disposition Plan: SNF tomorrow Consultants:   ortho Procedures:    Antimicrobials:  Anti-infectives    Start     Dose/Rate Route Frequency Ordered Stop   01/24/17 1630  ceFAZolin (ANCEF) IVPB 1 g/50 mL premix     1 g 100 mL/hr over 30 Minutes Intravenous Every 6 hours 01/24/17 1304 01/25/17 0425   01/24/17 0945  ceFAZolin (ANCEF) IVPB 2g/100 mL premix     2 g 200 mL/hr over 30 Minutes Intravenous On call to O.R. 01/24/17 0942 01/24/17 1025   01/24/17 0945  ceFAZolin (ANCEF) 2-4 GM/100ML-% IVPB    Comments:  Henrine Screws   : cabinet override      01/24/17 0945 01/24/17 1025       Objective: Vitals:   01/24/17 2143 01/25/17 0058 01/25/17 0353 01/25/17 1440  BP: 114/74 (!) 114/52 (!) 144/57 (!) 134/45  Pulse:  76 87 89  Resp: 19 16 18 18   Temp: 98.6 F  (37 C) 98.4 F (36.9 C) 98.7 F (37.1 C) (!) 101.3 F (38.5 C)  TempSrc: Oral Oral Oral Axillary  SpO2: 93% 96% 96% 96%    Intake/Output Summary (Last 24 hours) at 01/25/17 1539 Last data filed at 01/25/17 1440  Gross per 24 hour  Intake               10 ml  Output                0 ml  Net               10 ml   There were no vitals filed for this visit.  Examination: General exam: Appears comfortable  HEENT: PERRLA, oral mucosa moist, no sclera icterus or thrush Respiratory system: Clear to auscultation. Respiratory effort normal. Cardiovascular system: S1 & S2 heard, RRR.  No murmurs  Gastrointestinal system: Abdomen soft, non-tender, nondistended. Normal bowel sound. No organomegaly Central nervous system: sleepy,  - No focal neurological deficits. Extremities: No cyanosis, clubbing or edema Skin: No rashes or ulcers Psychiatry:  Confused, agitated at times    Data Reviewed: I have personally reviewed following labs and imaging studies  CBC:  Recent Labs Lab 01/23/17 1124 01/23/17 2001 01/25/17 0605  WBC  --  6.6 8.5  NEUTROABS  --  3.9  --   HGB  12.2* 13.1 10.4*  HCT 36.0* 38.6* 31.5*  MCV  --  101.8* 101.9*  PLT  --  246 419   Basic Metabolic Panel:  Recent Labs Lab 01/23/17 1124 01/23/17 2133 01/25/17 0605  NA 143 140 137  K 3.6 5.6* 4.7  CL 101 102 104  CO2  --  28 25  GLUCOSE 87 99 119*  BUN 12 13 10   CREATININE 0.80 0.95 0.93  CALCIUM  --  9.1 8.4*   GFR: Estimated Creatinine Clearance: 55.2 mL/min (by C-G formula based on SCr of 0.93 mg/dL). Liver Function Tests:  Recent Labs Lab 01/23/17 2133  AST 53*  ALT 17  ALKPHOS 161*  BILITOT 1.5*  PROT 7.7  ALBUMIN 3.9   No results for input(s): LIPASE, AMYLASE in the last 168 hours. No results for input(s): AMMONIA in the last 168 hours. Coagulation Profile:  Recent Labs Lab 01/23/17 2348  INR 0.96   Cardiac Enzymes:  Recent Labs Lab 01/24/17 0338  CKTOTAL 226   BNP (last 3  results) No results for input(s): PROBNP in the last 8760 hours. HbA1C: No results for input(s): HGBA1C in the last 72 hours. CBG:  Recent Labs Lab 01/23/17 0948  GLUCAP 72   Lipid Profile: No results for input(s): CHOL, HDL, LDLCALC, TRIG, CHOLHDL, LDLDIRECT in the last 72 hours. Thyroid Function Tests: No results for input(s): TSH, T4TOTAL, FREET4, T3FREE, THYROIDAB in the last 72 hours. Anemia Panel: No results for input(s): VITAMINB12, FOLATE, FERRITIN, TIBC, IRON, RETICCTPCT in the last 72 hours. Urine analysis:    Component Value Date/Time   COLORURINE STRAW (A) 01/23/2017 2001   APPEARANCEUR CLEAR 01/23/2017 2001   LABSPEC 1.009 01/23/2017 2001   PHURINE 8.0 01/23/2017 2001   GLUCOSEU NEGATIVE 01/23/2017 2001   HGBUR NEGATIVE 01/23/2017 2001   [place] NEGATIVE 01/23/2017 2001   [place] NEGATIVE 01/23/2017 2001   PROTEINUR NEGATIVE 01/23/2017 2001   UROBILINOGEN 0.2 01/16/2015 2155   NITRITE NEGATIVE 01/23/2017 2001   LEUKOCYTESUR NEGATIVE 01/23/2017 2001   Sepsis Labs: @LABRCNTIP (procalcitonin:4,lacticidven:4) )No results found for this or any previous visit (from the past 240 hour(s)).       Radiology Studies: Dg Chest 1 View  Result Date: 01/23/2017 CLINICAL DATA:  Status post fall. EXAM: CHEST 1 VIEW COMPARISON:  May 01, 2016 FINDINGS: The mediastinal contour is normal. The heart size is mildly enlarged. There is chronic elevation of right hemidiaphragm. There is a small right pleural effusion with mild subjacent atelectasis. The left lung is clear. The bony structures are stable. IMPRESSION: Chronic elevation of the right hemidiaphragm. Small right pleural effusion with mild subjacent atelectasis. Electronically Signed   By: Abelardo Diesel M.D.   On: 01/23/2017 19:59   Dg Tibia/fibula Right  Result Date: 01/23/2017 CLINICAL DATA:  Leg pain, fall from wheelchair EXAM: RIGHT TIBIA AND FIBULA - 2 VIEW COMPARISON:  None. FINDINGS: The study is limited  by patient mobility and positioning. No definite acute displaced fracture or malalignment is seen. There are vascular calcifications. Sock artifact over the foot. IMPRESSION: No definite acute osseous abnormality. Electronically Signed   By: Donavan Foil M.D.   On: 01/23/2017 21:05   Ct Head Wo Contrast  Result Date: 01/23/2017 CLINICAL DATA:  Patient BIB EMS from [facility]. Staff reported the patient "got up and took off running out of his wheelchair, and fell after about 10-39ft." Staff reported patient did not hit his head or lose consciousness. Staff reports patient is not on blood thinners. Patient has history  of dementia. Patient c/o pain to right leg. Shortening and rotation noted to right lower extremity. Patient is high fall risk and was discharged from [facility] last night for another fall. EXAM: CT HEAD WITHOUT CONTRAST CT CERVICAL SPINE WITHOUT CONTRAST TECHNIQUE: Multidetector CT imaging of the head and cervical spine was performed following the standard protocol without intravenous contrast. Multiplanar CT image reconstructions of the cervical spine were also generated. COMPARISON:  12/22/2016 FINDINGS: CT HEAD FINDINGS Brain: No evidence of acute infarction, hemorrhage, hydrocephalus, extra-axial collection or mass lesion/mass effect. Moderate atrophy and extensive white matter hypoattenuation consistent with advanced chronic microvascular ischemic change, stable from previous day's study. Vascular: No hyperdense vessel or unexpected calcification. Skull: Normal. Negative for fracture or focal lesion. Sinuses/Orbits: No acute abnormality of the globes and orbits. Sinuses and mastoid air cells are clear. Other: None. CT CERVICAL SPINE FINDINGS Alignment: Exaggerated lordosis. Mild degenerative anterolisthesis of C7 on T1. Skull base and vertebrae: No acute fracture. No primary bone lesion or focal pathologic process. Soft tissues and spinal canal: No prevertebral fluid or swelling. No visible  canal hematoma. Disc levels: Moderate loss of disc height at C3-C4. Mild loss of disc height at C4-C5. Marked loss of disc height at C5-C6, C6-C7 and C7-T1. There is spondylotic disc bulging with endplate spurring at these levels. Bilateral facet degenerative changes are noted from C2-C3 through C7-T1. There are varying degrees of neural foraminal narrowing due to facet and uncovertebral spurring. No convincing disc herniation. Upper chest: No acute findings. Other: None. IMPRESSION: HEAD CT 1. No acute intracranial abnormalities. 2. Atrophy and advanced chronic microvascular ischemic change. Stable appearance from the previous day's study. CERVICAL CT 1. No fracture or acute findings. 2. Advanced degenerative changes stable from the prior cervical CT. Electronically Signed   By: Lajean Manes M.D.   On: 01/23/2017 20:40   Ct Cervical Spine Wo Contrast  Result Date: 01/23/2017 CLINICAL DATA:  Patient BIB EMS from [facility]. Staff reported the patient "got up and took off running out of his wheelchair, and fell after about 10-33ft." Staff reported patient did not hit his head or lose consciousness. Staff reports patient is not on blood thinners. Patient has history of dementia. Patient c/o pain to right leg. Shortening and rotation noted to right lower extremity. Patient is high fall risk and was discharged from [facility] last night for another fall. EXAM: CT HEAD WITHOUT CONTRAST CT CERVICAL SPINE WITHOUT CONTRAST TECHNIQUE: Multidetector CT imaging of the head and cervical spine was performed following the standard protocol without intravenous contrast. Multiplanar CT image reconstructions of the cervical spine were also generated. COMPARISON:  12/22/2016 FINDINGS: CT HEAD FINDINGS Brain: No evidence of acute infarction, hemorrhage, hydrocephalus, extra-axial collection or mass lesion/mass effect. Moderate atrophy and extensive white matter hypoattenuation consistent with advanced chronic microvascular  ischemic change, stable from previous day's study. Vascular: No hyperdense vessel or unexpected calcification. Skull: Normal. Negative for fracture or focal lesion. Sinuses/Orbits: No acute abnormality of the globes and orbits. Sinuses and mastoid air cells are clear. Other: None. CT CERVICAL SPINE FINDINGS Alignment: Exaggerated lordosis. Mild degenerative anterolisthesis of C7 on T1. Skull base and vertebrae: No acute fracture. No primary bone lesion or focal pathologic process. Soft tissues and spinal canal: No prevertebral fluid or swelling. No visible canal hematoma. Disc levels: Moderate loss of disc height at C3-C4. Mild loss of disc height at C4-C5. Marked loss of disc height at C5-C6, C6-C7 and C7-T1. There is spondylotic disc bulging with endplate spurring at these  levels. Bilateral facet degenerative changes are noted from C2-C3 through C7-T1. There are varying degrees of neural foraminal narrowing due to facet and uncovertebral spurring. No convincing disc herniation. Upper chest: No acute findings. Other: None. IMPRESSION: HEAD CT 1. No acute intracranial abnormalities. 2. Atrophy and advanced chronic microvascular ischemic change. Stable appearance from the previous day's study. CERVICAL CT 1. No fracture or acute findings. 2. Advanced degenerative changes stable from the prior cervical CT. Electronically Signed   By: Lajean Manes M.D.   On: 01/23/2017 20:40   Dg Knee Complete 4 Views Right  Result Date: 01/23/2017 CLINICAL DATA:  [address] out of wheelchair with pain to the leg EXAM: RIGHT KNEE - COMPLETE 4+ VIEW COMPARISON:  01/23/2017 FINDINGS: Mild to moderate patellofemoral degenerative changes. Mild to moderate medial and lateral degenerative changes. No acute displaced fracture. Vascular calcifications. No large effusion. IMPRESSION: Degenerative changes.  No definite acute osseous abnormality. Electronically Signed   By: Donavan Foil M.D.   On: 01/23/2017 21:02   Dg Hip Unilat With Pelvis 1v  Right  Result Date: 01/24/2017 CLINICAL DATA:  Right hip arthroplasty. EXAM: DG HIP (WITH OR WITHOUT PELVIS) 1V RIGHT COMPARISON:  Radiography from yesterday FINDINGS: Bipolar right hip hemiarthroplasty is located in this single projection. No visible periprosthetic fracture. Changes of pelvic lymphadenectomy and hernia repair. IMPRESSION: Right hip hemiarthroplasty without visible complication. Electronically Signed   By: Monte Fantasia M.D.   On: 01/24/2017 19:27   Dg Hip Unilat  With Pelvis 2-3 Views Right  Result Date: 01/23/2017 CLINICAL DATA:  Status post fall. EXAM: DG HIP (WITH OR WITHOUT PELVIS) 2-3V RIGHT COMPARISON:  None. FINDINGS: There is deformity of the right proximal femur at the junction of femoral head and neck is consistent with fracture. Degenerative joint changes of the spine are noted. IMPRESSION: Deformity of the right proximal femur at the junction of femoral head and neck is consistent with fracture. Electronically Signed   By: Abelardo Diesel M.D.   On: 01/23/2017 19:58      Scheduled Meds: . amLODipine  5 mg Oral Daily  . divalproex  250 mg Oral BID  . donepezil  10 mg Oral Daily  . enoxaparin (LOVENOX) injection  40 mg Subcutaneous Q24H  . escitalopram  10 mg Oral Daily  . feeding supplement (ENSURE ENLIVE)  237 mL Oral QID  . gabapentin  100 mg Oral BID  . lubriderm seriously sensitive  1 application Topical Daily  . memantine  10 mg Oral BID  . mirtazapine  15 mg Oral QHS  . multivitamin  1 tablet Oral Daily   Continuous Infusions: . tranexamic acid (CYKLOKAPRON) topical -INTRAOP       LOS: 2 days    Time spent in minutes: 35    Debbe Odea, MD [facility] Pager: [email or web address] Password [facility] 01/25/2017, 3:39 PM

## 2017-01-25 NOTE — Evaluation (Signed)
Occupational Therapy Evaluation Patient Details Name: Jimmy Solis MRN: [id number] DOB: 1929-01-31 83 y/o    History of Present Illness Pt is an 83 y.o. male admitted with c/o R hip pain after reports of several falls at his SNF; subsequent x-rays shows R femoral neck fx. With h/o dementia, pt minimally interactive/contributory to hx. Pertinent PMH on file includes CVA ("lost vision in R eye"), prostate CA, depression, R eye blindness, Alzheimer's disease.   Clinical Impression   This 83 y/o M presents with the above. Pt lives in nursing facility, unable to provide PLOF, though family present end of session and reporting Pt was using RW for functional mobility, was receiving assistance from facility staff for bathing and dressing ADLs. Pt currently requires total assist +2 for bed mobility to sit EOB, MaxA to maintain static sitting balance to ensure adherence to posterior hip precautions. Pt requires Mod-MaxA for UB ADLs, Max-total assist for LB ADLs. Will continue to follow acutely, and recommend additional OT services in SNF setting after discharge to progress Pt's safety and independence with ADLs and functional mobility.     Follow Up Recommendations  SNF;Supervision/Assistance - 24 hour    Equipment Recommendations  Other (comment) (to be determined in next venue )           Precautions / Restrictions Precautions Precautions: Posterior Hip;Fall Precaution Booklet Issued: Yes (comment) Restrictions Weight Bearing Restrictions: Yes RLE Weight Bearing: Weight bearing as tolerated      Mobility Bed Mobility Overal bed mobility: Needs Assistance Bed Mobility: Supine to Sit;Sit to Supine     Supine to sit: Total assist;+2 for physical assistance;+2 for safety/equipment Sit to supine: Total assist;+2 for physical assistance;+2 for safety/equipment   General bed mobility comments: +2 total assist to achieve sitting EOB with assist for LE management and to  bring trunk into upright position, Pt briefly able to maintain static sitting with close minguard, however overall required MaxA with Pt in slight posterior lean to ensure adherence to posterior hip precautions while seated; total assist for trunk and LE management to return to supine and position in bed                         Balance Overall balance assessment: Needs assistance;History of Falls Sitting-balance support: Feet supported Sitting balance-Leahy Scale: Poor                                     ADL either performed or assessed with clinical judgement   ADL Overall ADL's : Needs assistance/impaired Eating/Feeding: Minimal assistance;Sitting   Grooming: Sitting;Wash/dry face;Moderate assistance Grooming Details (indicate cue type and reason): Pt initially required tactile/verbal cues but was able to demonstrate task, washing lower portion of face only  Upper Body Bathing: Moderate assistance;Bed level   Lower Body Bathing: Total assistance;+2 for safety/equipment;+2 for physical assistance;Sitting/lateral leans Lower Body Bathing Details (indicate cue type and reason): cueing/assist for adhering to hip precautions  Upper Body Dressing : Moderate assistance;Sitting;Bed level   Lower Body Dressing: Total assistance;+2 for physical assistance;+2 for safety/equipment;Sitting/lateral leans;Bed level Lower Body Dressing Details (indicate cue type and reason): cueing/assist for adhering to hip precautions      Toileting- Clothing Manipulation and Hygiene: Total assistance;Bed level       Functional mobility during ADLs: +2 for physical assistance;+2 for safety/equipment;Total assistance General ADL Comments: Pt requires total assist +2 to achieve  sitting EOB, able to maintain sitting EOB for approx 10 min during session with MaxA posteriorly to ensure Pt maintained hip precautions                          Pertinent Vitals/Pain Pain Assessment:  Faces Faces Pain Scale: Hurts whole lot Pain Location: RLE with movement  Pain Descriptors / Indicators: Grimacing;Sore Pain Intervention(s): Limited activity within patient's tolerance;Repositioned;Monitored during session          Extremity/Trunk Assessment Upper Extremity Assessment Upper Extremity Assessment: RUE deficits/detail;LUE deficits/detail;Difficult to assess due to impaired cognition;Generalized weakness RUE Deficits / Details: shoulder flexion 3-/5, Pt initially with difficulty extending R elbow, however was later able to reach to towards therapist's hand with full elbow extension  LUE Deficits / Details: shoulder flexion 2-/5, significant edema in L hand noted    Lower Extremity Assessment Lower Extremity Assessment: Defer to PT evaluation   Cervical / Trunk Assessment Cervical / Trunk Assessment: Kyphotic   Communication Communication Communication: Other (comment);Expressive difficulties;Receptive difficulties (Pt with baseline dementia )   Cognition Arousal/Alertness: Lethargic (more alert upon sitting EOB ) Behavior During Therapy: Flat affect Overall Cognitive Status: History of cognitive impairments - at baseline                                 General Comments: Pt with baseline dementia; inconsistently follows one-step commands with increased time and multimodal cues    General Comments  family present end of session                Home Living Family/patient expects to be discharged to:: Skilled nursing facility                                 Additional Comments: Pt from SNF      Prior Functioning/Environment Level of Independence: Needs assistance        Comments: family present end of session, reporting Pt was using RW for ambulation, was receiving assistance from facility staff for bathing and dressing ADLs         OT Problem List: Decreased strength;Impaired balance (sitting and/or standing);Decreased  cognition;Decreased knowledge of precautions;Pain;Decreased activity tolerance;Decreased knowledge of use of DME or AE;Decreased range of motion      OT Treatment/Interventions: Self-care/ADL training;DME and/or AE instruction;Therapeutic activities;Balance training;Therapeutic exercise;Energy conservation;Patient/family education    OT Goals(Current goals can be found in the care plan section) Acute Rehab OT Goals Patient Stated Goal: none stated  OT Goal Formulation: Patient unable to participate in goal setting Time For Goal Achievement: 02/08/17 Potential to Achieve Goals: Fair  OT Frequency: Min 2X/week               Co-evaluation PT/OT/SLP Co-Evaluation/Treatment: Yes Reason for Co-Treatment: Necessary to address cognition/behavior during functional activity;For patient/therapist safety;To address functional/ADL transfers PT goals addressed during session: Mobility/safety with mobility;Balance OT goals addressed during session: ADL's and self-care;Strengthening/ROM      AM-PAC PT "6 Clicks" Daily Activity     Outcome Measure Help from another person eating meals?: A Little Help from another person taking care of personal grooming?: A Little Help from another person toileting, which includes using toliet, bedpan, or urinal?: Total Help from another person bathing (including washing, rinsing, drying)?: A Lot Help from another person to put on and taking off regular upper body clothing?:  A Lot Help from another person to put on and taking off regular lower body clothing?: Total 6 Click Score: 12   End of Session Equipment Utilized During Treatment: Oxygen Nurse Communication: Mobility status  Activity Tolerance: Patient tolerated treatment well Patient left: in bed;with call bell/phone within reach;with family/visitor present  OT Visit Diagnosis: Other abnormalities of gait and mobility (R26.89);Muscle weakness (generalized) (M62.81);History of falling (Z91.81);Pain Pain -  Right/Left: Right Pain - part of body: Hip                Time: 3462-1947 OT Time Calculation (min): 31 min Charges:  OT General Charges $OT Visit: 1 Visit OT Evaluation $OT Eval Moderate Complexity: 1 Mod G-Codes:     Lou Cal, OT Pager [phone] 01/25/2017   Raymondo Band 01/25/2017, 2:47 PM

## 2017-01-25 NOTE — Evaluation (Signed)
Clinical/Bedside Swallow Evaluation Patient Details  Name: Jimmy Solis MRN: [id number] Date of Birth: 05-27-1928  Today's Date: 01/25/2017 Time: SLP Start Time (ACUTE ONLY): 42 SLP Stop Time (ACUTE ONLY): 1539 SLP Time Calculation (min) (ACUTE ONLY): 15 min  Past Medical History:  Past Medical History:  Diagnosis Date  . Abnormality of gait 04/07/2015  . Alzheimers disease   . Blind right eye   . Cancer of skin of back   . 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 Depression    hx  . Falls frequently    "probably once/month" (01/16/2015)  . Prostate cancer ([place])   . Retinal detachment    left  . Stroke [facility]) ~ 12/2014   "lost vision in right eye"  . TIA (transient ischemic attack)    "several over the last couple years; brain scans have reflected that; sees /Dr. Jannifer Franklin" (01/16/2015)   Past Surgical History:  Past Surgical History:  Procedure Laterality Date  . ABDOMINAL HERNIA REPAIR  ~ 2003  . CATARACT EXTRACTION W/ INTRAOCULAR LENS  IMPLANT, BILATERAL Bilateral ~ 2004  . EYE SURGERY    . FRACTURE SURGERY    . HEMIARTHROPLASTY HIP Right 01/24/2017  . HERNIA REPAIR    . HIP ARTHROPLASTY Right 01/24/2017   Procedure: ARTHROPLASTY BIPOLAR HIP (HEMIARTHROPLASTY);  Surgeon: Renette Butters, MD;  Location: [place];  Service: Orthopedics;  Laterality: Right;  . PROSTATECTOMY  ~ 2001  . RETINAL DETACHMENT SURGERY Left ~ 2006  . SKIN CANCER EXCISION  ~ 2013   "back"  . TONSILLECTOMY     HPI:  Pt is an 83 y.o. male admitted with c/o R hip pain after reports of several falls at his SNF; subsequent x-rays shows R femoral neck fx. With h/o dementia, pt minimally interactive/contributory to hx. Pertinent PMH on file includes CVA ("lost vision in R eye"), prostate CA, depression, R eye blindness, Alzheimer's disease. Pt's swallowing was evaluated two years ago (MBS 01/19/15), at which time he had dysphagia with recommendations to be NPO due to significant pharyngeal residuals.  Trials of purees were initiated as approved by MD with intermittent coughing still observed.   Assessment / Plan / Recommendation Clinical Impression  Pt shows signs of dysphagia with all consistencies tested, including reduced awareness and multiple swallows. Signs of dysphagia are most prevalent with thin liquids, which also elicit strong, immediate coughing that is concerning for aspiration. Thicker textures do not result in any immediate coughing, but delayed coughs were noted before SLP left the room. No records of swallowing tests are noted since 2016, but at that time he had a significant dysphagia and there was concern for a more chronic risk of aspiration. I suspect that this is likely an ongoing problem, although I do not know what diet he was on prior to this admission. It is also possible that he is currently at a higher risk for aspiration or related infections, given acute hospitalization, weakness, decreased mobility. For now, would start trials of purees and nectar thick liquids when given in very small bites/sips. Emphasis should also be on frequent, thorough oral care. SLP will f/u to assess for tolerance versus need for further assessment. SLP Visit Diagnosis: Dysphagia, oropharyngeal phase (R13.12)    Aspiration Risk  Moderate aspiration risk    Diet Recommendation Dysphagia 1 (Puree);Nectar-thick liquid   Liquid Administration via: Straw;Spoon Medication Administration: Crushed with puree Supervision: Staff to assist with self feeding;Full supervision/cueing for compensatory strategies Compensations: Minimize environmental distractions;Slow rate;Small sips/bites Postural Changes:  Seated upright at 90 degrees;Remain upright for at least 30 minutes after po intake    Other  Recommendations Oral Care Recommendations: Oral care QID Other Recommendations: Order thickener from pharmacy;Prohibited food (jello, ice cream, thin soups);Remove water pitcher;Have oral suction available    Follow up Recommendations Skilled Nursing facility      Frequency and Duration min 2x/week  2 weeks       Prognosis Prognosis for Safe Diet Advancement: Fair Barriers to Reach Goals: Time post onset;Cognitive deficits      Swallow Study   General HPI: Pt is an 83 y.o. male admitted with c/o R hip pain after reports of several falls at his SNF; subsequent x-rays shows R femoral neck fx. With h/o dementia, pt minimally interactive/contributory to hx. Pertinent PMH on file includes CVA ("lost vision in R eye"), prostate CA, depression, R eye blindness, Alzheimer's disease. Pt's swallowing was evaluated two years ago (MBS 01/19/15), at which time he had dysphagia with recommendations to be NPO due to significant pharyngeal residuals. Trials of purees were initiated as approved by MD with intermittent coughing still observed. Type of Study: Bedside Swallow Evaluation Previous Swallow Assessment: see HPI Diet Prior to this Study: Regular;Thin liquids Temperature Spikes Noted: No Respiratory Status: Nasal cannula History of Recent Intubation: Yes (for procedure only) Length of Intubations (days):  (for procedure only on 9/18) Behavior/Cognition: Alert;Requires cueing Oral Cavity Assessment: Other (comment) (difficult to visualize, pt does not open his mouth much) Oral Care Completed by SLP: No Vision:  (keeps his eyes closed) Self-Feeding Abilities: Total assist Patient Positioning: Upright in bed Baseline Vocal Quality: Normal Volitional Swallow: Unable to elicit    Oral/Motor/Sensory Function     Ice Chips Ice chips: Not tested   Thin Liquid Thin Liquid: Impaired Presentation: Straw Oral Phase Impairments: Poor awareness of bolus Pharyngeal  Phase Impairments: Multiple swallows;Cough - Immediate;Suspected delayed Swallow    Nectar Thick Nectar Thick Liquid: Impaired Presentation: Straw Oral Phase Impairments: Poor awareness of bolus Pharyngeal Phase Impairments: Suspected  delayed Swallow;Cough - Delayed   Honey Thick Honey Thick Liquid: Not tested   Puree Puree: Impaired Presentation: Spoon Oral Phase Impairments: Poor awareness of bolus Pharyngeal Phase Impairments: Suspected delayed Swallow;Cough - Delayed   Solid   GO   Solid: Not tested        Germain Osgood 01/25/2017,3:57 PM  Germain Osgood, M.A. CCC-SLP [phone]

## 2017-01-25 NOTE — Progress Notes (Signed)
RN and student nurse bladder scanned pt; bladder scan showed 248 mL. MD notified.

## 2017-01-25 NOTE — Evaluation (Signed)
Physical Therapy Evaluation Patient Details Name: Jimmy Solis MRN: [id number] DOB: [birth year] Today's Date: 01/25/2017   History of Present Illness  Pt is an 83 y.o. male admitted with c/o R hip pain after reports of several falls at his SNF; subsequent x-rays shows R femoral neck fx. With h/o dementia, pt minimally interactive/contributory to hx. Pertinent PMH on file includes CVA ("lost vision in R eye"), prostate CA, depression, R eye blindness, Alzheimer's disease.    Clinical Impression  Pt presents with an overall decrease in functional mobility secondary to above. PTA, pt lives in nursing facility, currently unable to answer questions regarding PLOF; family present at end of session and states pt uses RW for mobility and requires assist for ADLs, they also state that this is similar to pt's baseline cognition. Today, pt required maxA+2 for bed mobility and to sit EOB; required maxA for static sitting EOB and to maintain posterior hip precautions. Pt would benefit from continued acute PT services to maximize functional mobility and independence prior to d/c with SNF-level follow-up therapies.     Follow Up Recommendations SNF;Supervision for mobility/OOB    Equipment Recommendations  Other (comment) (defer to next venue)    Recommendations for Other Services       Precautions / Restrictions Precautions Precautions: Posterior Hip;Fall Precaution Booklet Issued: Yes (comment) Restrictions Weight Bearing Restrictions: Yes RLE Weight Bearing: Weight bearing as tolerated      Mobility  Bed Mobility Overal bed mobility: Needs Assistance Bed Mobility: Supine to Sit;Sit to Supine     Supine to sit: Total assist;+2 for physical assistance;+2 for safety/equipment Sit to supine: Total assist;+2 for physical assistance;+2 for safety/equipment   General bed mobility comments: +2 total assist to achieve sitting EOB with assist for LE management and to bring trunk into upright  position, Pt briefly able to maintain static sitting with close minguard, however overall required MaxA with Pt in slight posterior lean to ensure adherence to posterior hip precautions while seated; total assist for trunk and LE management to return to supine and position in bed   Transfers                    Ambulation/Gait                Stairs            Wheelchair Mobility    Modified Rankin (Stroke Patients Only)       Balance Overall balance assessment: Needs assistance;History of Falls Sitting-balance support: Feet supported Sitting balance-Leahy Scale: Poor                                       Pertinent Vitals/Pain Pain Assessment: Faces Faces Pain Scale: Hurts whole lot Pain Location: RLE with movement  Pain Descriptors / Indicators: Grimacing;Sore Pain Intervention(s): Limited activity within patient's tolerance;Monitored during session;Repositioned    Home Living Family/patient expects to be discharged to:: Skilled nursing facility                 Additional Comments: Pt from SNF    Prior Function Level of Independence: Needs assistance         Comments: family present end of session, reporting Pt was using RW for ambulation, was receiving assistance from facility staff for bathing and dressing ADLs      Hand Dominance        Extremity/Trunk Assessment  Upper Extremity Assessment Upper Extremity Assessment: RUE deficits/detail;Difficult to assess due to impaired cognition;LUE deficits/detail RUE Deficits / Details: shoulder flexion 3-/5, Pt initially with difficulty extending R elbow, however was later able to reach to towards therapist's hand with full elbow extension  LUE Deficits / Details: shoulder flexion 2-/5, significant edema in L hand noted     Lower Extremity Assessment Lower Extremity Assessment: RLE deficits/detail;LLE deficits/detail;Difficult to assess due to impaired cognition RLE  Deficits / Details: s/p R hemiarthroplasty; knee flex/ext grossly 2/5 (may be limited due to pain) RLE: Unable to fully assess due to pain LLE Deficits / Details: L knee flex/ext grossly 3/5 LLE: Unable to fully assess due to pain    Cervical / Trunk Assessment Cervical / Trunk Assessment: Kyphotic  Communication   Communication: Other (comment);Expressive difficulties;Receptive difficulties (Pt w/ baseline dementia)  Cognition Arousal/Alertness: Lethargic Behavior During Therapy: Flat affect Overall Cognitive Status: History of cognitive impairments - at baseline Area of Impairment: Orientation;Attention;Memory;Following commands;Safety/judgement;Awareness;Problem solving                 Orientation Level: Disoriented to;Place;Time;Situation   Memory: Decreased short-term memory;Decreased recall of precautions Following Commands: Follows one step commands inconsistently   Awareness: Intellectual Problem Solving: Slow processing;Decreased initiation;Requires verbal cues;Requires tactile cues General Comments: Pt with baseline dementia; inconsistently follows one-step commands with increased time and multimodal cues       General Comments General comments (skin integrity, edema, etc.): family present end of session     Exercises     Assessment/Plan    PT Assessment Patient needs continued PT services  PT Problem List Decreased strength;Decreased range of motion;Decreased activity tolerance;Decreased balance;Decreased mobility;Decreased cognition;Decreased knowledge of use of DME;Decreased knowledge of precautions;Impaired sensation;Pain       PT Treatment Interventions DME instruction;Gait training;Stair training;Functional mobility training;Therapeutic activities;Therapeutic exercise;Balance training;Patient/family education    PT Goals (Current goals can be found in the Care Plan section)  Acute Rehab PT Goals Patient Stated Goal: none stated     Frequency Min  3X/week   Barriers to discharge        Co-evaluation PT/OT/SLP Co-Evaluation/Treatment: Yes Reason for Co-Treatment: Necessary to address cognition/behavior during functional activity;For patient/therapist safety;To address functional/ADL transfers PT goals addressed during session: Mobility/safety with mobility;Balance OT goals addressed during session: ADL's and self-care;Strengthening/ROM       AM-PAC PT "6 Clicks" Daily Activity  Outcome Measure Difficulty turning over in bed (including adjusting bedclothes, sheets and blankets)?: Unable Difficulty moving from lying on back to sitting on the side of the bed? : Unable Difficulty sitting down on and standing up from a chair with arms (e.g., wheelchair, bedside commode, etc,.)?: Unable Help needed moving to and from a bed to chair (including a wheelchair)?: Total Help needed walking in hospital room?: Total Help needed climbing 3-5 steps with a railing? : Total 6 Click Score: 6    End of Session   Activity Tolerance: Patient limited by pain;Patient limited by fatigue;Other (comment) (hx of cognitive impairments) Patient left: in bed;with call bell/phone within reach;with family/visitor present Nurse Communication: Mobility status;Other (comment) (sore on back of head; L hand swelling) PT Visit Diagnosis: Other abnormalities of gait and mobility (R26.89);Pain Pain - Right/Left: Right Pain - part of body: Hip    Time: 8295-6213 PT Time Calculation (min) (ACUTE ONLY): 32 min   Charges:   PT Evaluation $PT Eval Moderate Complexity: 1 Mod     PT G Codes:       Mabeline Caras, PT, DPT Acute  Rehab Services  Pager: [phone]  Derry Lory 01/25/2017, 3:57 PM

## 2017-01-25 NOTE — Social Work (Signed)
CSW awaiting PT evaluations, once completed will send out to SNF's. Pt guardian would like SNF's in the [place] area.  CSW will f/u.  Elissa Hefty, LCSW Clinical Social Worker [phone]

## 2017-01-26 NOTE — NC FL2 (Signed)
[place] LEVEL OF CARE SCREENING TOOL     IDENTIFICATION  Patient Name: Jimmy Solis Birthdate: [birth year] Sex: male Admission Date (Current Location): 01/23/2017  [facility] and [place] Number:  Herbalist and Address:  [facility], [place] [address], [place], [place] [address]      Provider Number: [id number]  Attending Physician Name and Address:  Debbe Odea, MD  Relative Name and Phone Number:  Mr. Chinita Pester, [phone] Mrs. Pierce    Current Level of Care: Hospital Recommended Level of Care: [place] Prior Approval Number:    Date Approved/Denied: 01/24/17 PASRR Number: [id number]  Discharge Plan: SNF    Current Diagnoses: Patient Active Problem List   Diagnosis Date Noted  . Closed right hip fracture ([place]) 01/23/2017  . Depression 01/23/2017  . Stroke ([place])   . Dementia with behavioral disturbance 03/25/2016  . Vascular dementia 04/07/2015  . Abnormality of gait 04/07/2015  . Essential hypertension 02/20/2015  . Aspiration pneumonia ([place]) 01/23/2015  . Fall   . Dysphagia   . Pressure ulcer 01/17/2015  . Rhabdomyolysis 01/16/2015  . Delirium 01/16/2015  . 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 Memory deficits 12/02/2014    Orientation RESPIRATION BLADDER Height & Weight     Self  O2 (Nasal Cannula 2L) Incontinent Weight:   Height:     BEHAVIORAL SYMPTOMS/MOOD NEUROLOGICAL BOWEL NUTRITION STATUS      Continent Diet (See DC Summary/Dysphagia Diet Nectar Thick)  AMBULATORY STATUS COMMUNICATION OF NEEDS Skin   Extensive Assist Non-Verbally Surgical wounds (Right Hip, adhesive bandage)                       Personal Care Assistance Level of Assistance  Bathing, Dressing, Feeding Bathing Assistance: Maximum assistance Feeding assistance: Limited assistance Dressing Assistance: Maximum assistance     Functional Limitations Info             SPECIAL CARE  FACTORS FREQUENCY  PT (By licensed PT), OT (By licensed OT)     PT Frequency: 3x week OT Frequency: 2x week            Contractures      Additional Factors Info  Code Status, Allergies, Psychotropic Code Status Info: Full Code Allergies Info: PENICILLINS, ATIVAN LORAZEPAM  Psychotropic Info: Depakote, Remeron         Current Medications (01/26/2017):  This is the current hospital active medication list Current Facility-Administered Medications  Medication Dose Route Frequency Provider Last Rate Last Dose  . acetaminophen (TYLENOL) tablet 500 mg  500 mg Oral Q4H PRN Ivor Costa, MD   500 mg at 01/25/17 2140  . alum & mag hydroxide-simeth (MAALOX/MYLANTA) 200-200-20 MG/5ML suspension 30 mL  30 mL Oral Q6H PRN Ivor Costa, MD      . amLODipine (NORVASC) tablet 5 mg  5 mg Oral Daily Ivor Costa, MD   5 mg at 01/25/17 0912  . divalproex (DEPAKOTE) DR tablet 250 mg  250 mg Oral BID Ivor Costa, MD   250 mg at 01/25/17 2140  . donepezil (ARICEPT) tablet 10 mg  10 mg Oral Daily Ivor Costa, MD   10 mg at 01/25/17 0912  . enoxaparin (LOVENOX) injection 40 mg  40 mg Subcutaneous Q24H Prudencio Burly III, PA-C   40 mg at 01/25/17 0912  . escitalopram (LEXAPRO) tablet 10 mg  10 mg Oral Daily Ivor Costa, MD   10 mg at 01/25/17 0913  .  feeding supplement (ENSURE ENLIVE) (ENSURE ENLIVE) liquid 237 mL  237 mL Oral QID Debbe Odea, MD   237 mL at 01/25/17 2141  . gabapentin (NEURONTIN) capsule 100 mg  100 mg Oral BID Ivor Costa, MD   100 mg at 01/25/17 2141  . guaiFENesin (ROBITUSSIN) 100 MG/5ML solution 200 mg  200 mg Oral Q6H PRN Ivor Costa, MD      . hydrALAZINE (APRESOLINE) injection 5 mg  5 mg Intravenous Q2H PRN Ivor Costa, MD      . HYDROcodone-acetaminophen (NORCO/VICODIN) 5-325 MG per tablet 1 tablet  1 tablet Oral Q6H PRN Debbe Odea, MD      . loperamide (IMODIUM) capsule 2 mg  2 mg Oral QID PRN Ivor Costa, MD      . lubriderm seriously sensitive lotion 1 application  1  application Topical Daily Ivor Costa, MD   1 application at 16/10/96 1000  . magnesium hydroxide (MILK OF MAGNESIA) suspension 30 mL  30 mL Oral QHS PRN Ivor Costa, MD      . memantine [facility]) tablet 10 mg  10 mg Oral BID Ivor Costa, MD   10 mg at 01/25/17 2140  . methocarbamol (ROBAXIN) tablet 500 mg  500 mg Oral Q8H PRN Ivor Costa, MD      . mirtazapine (REMERON) tablet 15 mg  15 mg Oral QHS Ivor Costa, MD   15 mg at 01/25/17 2141  . multivitamin (PROSIGHT) tablet 1 tablet  1 tablet Oral Daily Ivor Costa, MD   1 tablet at 01/25/17 0912  . ondansetron (ZOFRAN) injection 4 mg  4 mg Intravenous Q8H PRN Ivor Costa, MD      . RESOURCE THICKENUP CLEAR   Oral PRN Debbe Odea, MD      . senna-docusate (Senokot-S) tablet 1 tablet  1 tablet Oral QHS PRN Ivor Costa, MD      . tranexamic acid (CYKLOKAPRON) 2,000 mg in sodium chloride 0.9 % 50 mL Topical Application  0,454 mg Topical Once Martensen, Charna Elizabeth III, PA-C      . traZODone (DESYREL) tablet 25 mg  25 mg Oral QHS PRN Ivor Costa, MD      . TRIPLE ANTIBIOTIC [phone] OINT 1 application  1 application Topical Daily PRN Ivor Costa, MD         Discharge Medications: Please see discharge summary for a list of discharge medications.  Relevant Imaging Results:  Relevant Lab Results:   Additional Information SS#:[id number] Wann, LCSW

## 2017-01-26 NOTE — Plan of Care (Signed)
Problem: Education: Goal: Knowledge of [place] [facility] Education information/materials will improve Outcome: Not Met (add Reason) dememtia

## 2017-01-26 NOTE — Social Work (Signed)
CSW sent out offers to SNF's near guardian in [place] as they requesting SNf closer to them. CSW will continue to follow for bed offers.  Elissa Hefty, LCSW Clinical Social Worker [phone]

## 2017-01-26 NOTE — Progress Notes (Signed)
PROGRESS NOTE    JUNIEL GROENE   VQM:[id number]  DOB: 1928/12/12  DOA: 01/23/2017 PCP: Cyndy Freeze, MD   Brief Narrative:  KMARION RAWL is a 83 y.o. male from ALF with medical history significant of dementia with behavioral disturbance, hypertension, stroke, prostate cancer, depression, central retinal artery occlusion, right eye blindness, abnormal gait, who presents with fall and right hip pain. Per report, pt fell twice in SNF. Pt was seen and discharged from ED 9/17 morning after being evaluated for fall. Staff reports that the patient got up and ran out of his wheelchair and fell after about 10-15 feet. He seems to have right hip pain.  Subjective: Not verbal with me today  Assessment & Plan:   Principal Problem:   Closed right hip fracture -   Fall - s/p hemiarthroplasty - will need to go to SNF - Lovenox for DVT prophylaxis per Ortho  Active Problems: - Fever 102.6  - at 2101 yesterday- resolved after 1 dose of Tylenol - UA negative on 9/18- no cough and no infiltrates on CXR on 9/17 - follow for recurrence- may simply be a post op fever    Essential hypertension - Amlodipine    Dementia with behavioral disturbance - Depakote, Aricept, namenda, Remeron - needs to be fed- per RN, he is eating and drinking well  Dysphagia with thin liquids - per SLP eval this admission, he needs nectar thick liquids- SLP will follow and advance at tolerated    Stroke  - ASA 81 mg    DVT prophylaxis: Lovenox Code Status: Full code Family Communication:  Disposition Plan: SNF tomorrow Consultants:   ortho Procedures:   9/18- ARTHROPLASTY BIPOLAR HIP (HEMIARTHROPLASTY) Antimicrobials:  Anti-infectives    Start     Dose/Rate Route Frequency Ordered Stop   01/24/17 1630  ceFAZolin (ANCEF) IVPB 1 g/50 mL premix     1 g 100 mL/hr over 30 Minutes Intravenous Every 6 hours 01/24/17 1304 01/25/17 0425   01/24/17 0945  ceFAZolin (ANCEF) IVPB 2g/100 mL premix     2 g 200  mL/hr over 30 Minutes Intravenous On call to O.R. 01/24/17 0942 01/24/17 1025   01/24/17 0945  ceFAZolin (ANCEF) 2-4 GM/100ML-% IVPB    Comments:  Henrine Screws   : cabinet override      01/24/17 0945 01/24/17 1025       Objective: Vitals:   01/25/17 1440 01/25/17 2109 01/25/17 2342 01/26/17 0753  BP: (!) 134/45 (!) 109/48  (!) 110/47  Pulse: 89 90  85  Resp: 18 16  16   Temp: (!) 101.3 F (38.5 C) (!) 102.6 F (39.2 C) 98.9 F (37.2 C) 98.1 F (36.7 C)  TempSrc: Axillary Axillary Axillary Axillary  SpO2: 96% 96%  97%    Intake/Output Summary (Last 24 hours) at 01/26/17 1536 Last data filed at 01/26/17 1156  Gross per 24 hour  Intake              500 ml  Output              201 ml  Net              299 ml   There were no vitals filed for this visit.  Examination: General exam: Appears comfortable  HEENT: PERRLA, oral mucosa moist, no sclera icterus or thrush Respiratory system: Clear to auscultation. Respiratory effort normal. Cardiovascular system: S1 & S2 heard,  No murmurs  Gastrointestinal system: Abdomen soft, non-tender, nondistended. Normal bowel sound. No  organomegaly Central nervous system: sleepy, will no awaken at this time- No focal neurological deficits. Extremities: No cyanosis, clubbing or edema Skin: No rashes or ulcers Psychiatry:  Mood & affect appropriate.      Data Reviewed: I have personally reviewed following labs and imaging studies  CBC:  Recent Labs Lab 01/23/17 1124 01/23/17 2001 01/25/17 0605  WBC  --  6.6 8.5  NEUTROABS  --  3.9  --   HGB 12.2* 13.1 10.4*  HCT 36.0* 38.6* 31.5*  MCV  --  101.8* 101.9*  PLT  --  246 161   Basic Metabolic Panel:  Recent Labs Lab 01/23/17 1124 01/23/17 2133 01/25/17 0605  NA 143 140 137  K 3.6 5.6* 4.7  CL 101 102 104  CO2  --  28 25  GLUCOSE 87 99 119*  BUN 12 13 10   CREATININE 0.80 0.95 0.93  CALCIUM  --  9.1 8.4*   GFR: Estimated Creatinine Clearance: 55.2 mL/min (by C-G formula  based on SCr of 0.93 mg/dL). Liver Function Tests:  Recent Labs Lab 01/23/17 2133  AST 53*  ALT 17  ALKPHOS 161*  BILITOT 1.5*  PROT 7.7  ALBUMIN 3.9   No results for input(s): LIPASE, AMYLASE in the last 168 hours. No results for input(s): AMMONIA in the last 168 hours. Coagulation Profile:  Recent Labs Lab 01/23/17 2348  INR 0.96   Cardiac Enzymes:  Recent Labs Lab 01/24/17 0338  CKTOTAL 226   BNP (last 3 results) No results for input(s): PROBNP in the last 8760 hours. HbA1C: No results for input(s): HGBA1C in the last 72 hours. CBG:  Recent Labs Lab 01/23/17 0948  GLUCAP 72   Lipid Profile: No results for input(s): CHOL, HDL, LDLCALC, TRIG, CHOLHDL, LDLDIRECT in the last 72 hours. Thyroid Function Tests: No results for input(s): TSH, T4TOTAL, FREET4, T3FREE, THYROIDAB in the last 72 hours. Anemia Panel: No results for input(s): VITAMINB12, FOLATE, FERRITIN, TIBC, IRON, RETICCTPCT in the last 72 hours. Urine analysis:    Component Value Date/Time   COLORURINE STRAW (A) 01/23/2017 2001   APPEARANCEUR CLEAR 01/23/2017 2001   LABSPEC 1.009 01/23/2017 2001   PHURINE 8.0 01/23/2017 2001   GLUCOSEU NEGATIVE 01/23/2017 2001   HGBUR NEGATIVE 01/23/2017 2001   [place] NEGATIVE 01/23/2017 2001   [place] NEGATIVE 01/23/2017 2001   PROTEINUR NEGATIVE 01/23/2017 2001   UROBILINOGEN 0.2 01/16/2015 2155   NITRITE NEGATIVE 01/23/2017 2001   LEUKOCYTESUR NEGATIVE 01/23/2017 2001   Sepsis Labs: @LABRCNTIP (procalcitonin:4,lacticidven:4) )No results found for this or any previous visit (from the past 240 hour(s)).       Radiology Studies: Dg Hip Unilat With Pelvis 1v Right  Result Date: 01/24/2017 CLINICAL DATA:  Right hip arthroplasty. EXAM: DG HIP (WITH OR WITHOUT PELVIS) 1V RIGHT COMPARISON:  Radiography from yesterday FINDINGS: Bipolar right hip hemiarthroplasty is located in this single projection. No visible periprosthetic fracture. Changes of pelvic  lymphadenectomy and hernia repair. IMPRESSION: Right hip hemiarthroplasty without visible complication. Electronically Signed   By: Monte Fantasia M.D.   On: 01/24/2017 19:27      Scheduled Meds: . amLODipine  5 mg Oral Daily  . divalproex  250 mg Oral BID  . donepezil  10 mg Oral Daily  . enoxaparin (LOVENOX) injection  40 mg Subcutaneous Q24H  . escitalopram  10 mg Oral Daily  . feeding supplement (ENSURE ENLIVE)  237 mL Oral QID  . gabapentin  100 mg Oral BID  . lubriderm seriously sensitive  1 application Topical  Daily  . memantine  10 mg Oral BID  . mirtazapine  15 mg Oral QHS  . multivitamin  1 tablet Oral Daily   Continuous Infusions: . tranexamic acid (CYKLOKAPRON) topical -INTRAOP       LOS: 3 days    Time spent in minutes: 35    Debbe Odea, MD [facility] Pager: [email or web address] Password [facility] 01/26/2017, 3:36 PM

## 2017-01-26 NOTE — Progress Notes (Signed)
Nutrition Follow-up  DOCUMENTATION CODES:   Not applicable  INTERVENTION:  Continue Ensure Enlive po QID, each supplement provides 350 kcal and 20 grams of protein.  Encourage adequate PO intake.   NUTRITION DIAGNOSIS:   Increased nutrient needs related to  (post op healing) as evidenced by estimated needs; ongoing  GOAL:   Patient will meet greater than or equal to 90% of their needs; progressing  MONITOR:   Weight trends, Supplement acceptance, Diet advancement, Labs, Skin, I & O's  REASON FOR ASSESSMENT:   Consult Assessment of nutrition requirement/status  ASSESSMENT:   83 y.o. male with medical history significant of dementia with behavioral disturbance, hypertension, stroke, prostate cancer, depression, central retinal artery occlusion, right eye blindness, abnormal gait, who presents with fall and right hip pain. Deformity of the right proximal femur at the junction of femoral head and neck is consistent with fracture  PROCEDURE (9/18): ARTHROPLASTY BIPOLAR HIP (HEMIARTHROPLASTY)  Pt was asleep during time of visit and did not wake to RD arousal attempts. No family at bedside. Meal completion has been 5-50%. Pt currently has Ensure ordered 4 times daily and has been consuming them. RD to continue with current orders to aid in adequate nutrition.   Nutrition-Focused physical exam completed. Findings are no fat depletion, moderate to severe muscle depletion, and no edema.   Labs and medications reviewed.   Diet Order:  DIET - DYS 1 Room service appropriate? Yes; Fluid consistency: Nectar Thick  Skin:   (Incision on R hip)  Last BM:  Unknown  Height:   Ht Readings from Last 1 Encounters:  01/23/17 5\' 8"  (1.727 m)    Weight:   Wt Readings from Last 1 Encounters:  01/23/17 160 lb (72.6 kg)    Ideal Body Weight:  63.6 kg  BMI:  There is no height or weight on file to calculate BMI.  Estimated Nutritional Needs:   Kcal:  1700-1850  Protein:  75-85  grams  Fluid:  1.7 - 1.8 L/day  EDUCATION NEEDS:   No education needs identified at this time  Corrin Parker, MS, RD, LDN Pager # [phone] After hours/ weekend pager # [phone]

## 2017-01-26 NOTE — Progress Notes (Signed)
   Assessment / Plan: 2 Days Post-Op  S/P Procedure(s) (LRB): ARTHROPLASTY BIPOLAR HIP (HEMIARTHROPLASTY) (Right) by Dr. Ernesta Amble. Murphy on 01/24/17  Principal Problem:   Closed right hip fracture [facility]) Active Problems:   Fall   Essential hypertension   Dementia with behavioral disturbance   Stroke [facility])   Depression  Right Femoral Neck Fracture s/p Hemiarthroplasty Stable from an orthopedic perspective.   Posterior Hip precautions Minimize Narcotic medicines in favor of Tylenol if able  Mobilize with therapy Incentive Spirometry Apply ice prn  Weight Bearing: Weight Bearing as Tolerated (WBAT) RLE, posterior hip precautions Dressings: PRN Mepilex.  VTE prophylaxis: Lovenox, SCDs, ambulation Dispo: Per primary.  SNF likely when stable medically.  Subjective: Patient minimally interactive dt dementia.  Denies pain.  Objective:   VITALS:   Vitals:   01/25/17 0353 01/25/17 1440 01/25/17 2109 01/25/17 2342  BP: (!) 144/57 (!) 134/45 (!) 109/48   Pulse: 87 89 90   Resp: 18 18 16    Temp: 98.7 F (37.1 C) (!) 101.3 F (38.5 C) (!) 102.6 F (39.2 C) 98.9 F (37.2 C)  TempSrc: Oral Axillary Axillary Axillary  SpO2: 96% 96% 96%    CBC Latest Ref Rng & Units 01/25/2017 01/23/2017 01/23/2017  WBC 4.0 - 10.5 K/uL 8.5 6.6 -  Hemoglobin 13.0 - 17.0 g/dL 10.4(L) 13.1 12.2(L)  Hematocrit 39.0 - 52.0 % 31.5(L) 38.6(L) 36.0(L)  Platelets 150 - 400 K/uL 209 246 -   BMP Latest Ref Rng & Units 01/25/2017 01/23/2017 01/23/2017  Glucose 65 - 99 mg/dL 119(H) 99 87  BUN 6 - 20 mg/dL 10 13 12   Creatinine 0.61 - 1.24 mg/dL 0.93 0.95 0.80  Sodium 135 - 145 mmol/L 137 140 143  Potassium 3.5 - 5.1 mmol/L 4.7 5.6(H) 3.6  Chloride 101 - 111 mmol/L 104 102 101  CO2 22 - 32 mmol/L 25 28 -  Calcium 8.9 - 10.3 mg/dL 8.4(L) 9.1 -   Intake/Output      09/19 0701 - 09/20 0700   P.O. 130   Total Intake 130   Net +130       Urine Occurrence 2 x      Physical Exam: General: NAD.  Supine in  bed.  No increased WOB. MSK Sensation intact distally Feet warm Dorsiflexion/Plantar flexion intact Incision: dressing C/D/I   Prudencio Burly III, PA-C 01/26/2017, 6:24 AM

## 2017-01-27 ENCOUNTER — Inpatient Hospital Stay (HOSPITAL_COMMUNITY): Payer: Medicare Other

## 2017-01-27 DIAGNOSIS — J189 Pneumonia, unspecified organism: Secondary | ICD-10-CM

## 2017-01-27 LAB — URINALYSIS, ROUTINE W REFLEX MICROSCOPIC
BILIRUBIN URINE: NEGATIVE
GLUCOSE, UA: NEGATIVE mg/dL
Hgb urine dipstick: NEGATIVE
Ketones, ur: NEGATIVE mg/dL
Leukocytes, UA: NEGATIVE
NITRITE: NEGATIVE
PH: 5 (ref 5.0–8.0)
Protein, ur: 30 mg/dL — AB
SPECIFIC GRAVITY, URINE: 1.025 (ref 1.005–1.030)
Squamous Epithelial / LPF: NONE SEEN

## 2017-01-27 MED ORDER — LEVOFLOXACIN IN D5W 750 MG/150ML IV SOLN
750.0000 mg | INTRAVENOUS | Status: DC
  Administered 2017-01-27: 750 mg via INTRAVENOUS
  Filled 2017-01-27 (×2): qty 150

## 2017-01-27 NOTE — Progress Notes (Signed)
Physical Therapy Treatment Patient Details Name: Jimmy Solis MRN: [id number] DOB: 25-Jul-1928 Today's Date: 01/27/2017    History of Present Illness Pt is an 83 y.o. male admitted with c/o R hip pain after reports of several falls at his SNF; subsequent x-rays shows R femoral neck fx. With h/o dementia, pt minimally interactive/contributory to hx. Pertinent PMH on file includes CVA ("lost vision in R eye"), prostate CA, depression, R eye blindness, Alzheimer's disease.    PT Comments    Pt supine in bed and limited ability to follow commands.  Pt required transfer OOB with maximove sling for safety.  OOB encouraged to improve OOB tolerance and improve respiratory function.  Pt remains to require total assistance for bed mobility at this time and will continue to benefit from SNF placement due to impairments.      Follow Up Recommendations  SNF;Supervision for mobility/OOB     Equipment Recommendations  Other (comment) (defer to next venue of care. )    Recommendations for Other Services       Precautions / Restrictions Precautions Precautions: Posterior Hip;Fall Precaution Booklet Issued: Yes (comment) Restrictions Weight Bearing Restrictions: Yes RLE Weight Bearing: Weight bearing as tolerated    Mobility  Bed Mobility Overal bed mobility: Needs Assistance Bed Mobility: Rolling Rolling: Total assist (PTA performed rolling with pillow between legs to adhere to posterior hip precautions.  )         General bed mobility comments: Pt remains to present as mentioned in eval therfore rolling performed in supine to place maximove sling under patient to progress patient to [place].  Pt performed sitting edge of chair to place pillows for propping required total assistance to progress trunk forward away from back of recliner.    Transfers Overall transfer level: Needs assistance Equipment used:  (mechanical lift (maxi-move))             General transfer comment: Pt lifted in  maximove sling and postioned in reclined position during transition to adhere to posterior hip precautions.  Once in sitting patient required assistance to position in chair for intake of lunch.    Ambulation/Gait Ambulation/Gait assistance:  (Pt is not safe to ambulate at this time.  )               Stairs            Wheelchair Mobility    Modified Rankin (Stroke Patients Only)       Balance Overall balance assessment: Needs assistance;History of Falls Sitting-balance support: Feet supported Sitting balance-Leahy Scale: Zero                                      Cognition Arousal/Alertness: Lethargic Behavior During Therapy: Flat affect Overall Cognitive Status: History of cognitive impairments - at baseline Area of Impairment: Orientation;Attention;Memory;Following commands;Safety/judgement;Awareness;Problem solving                 Orientation Level: Disoriented to;Place;Time;Situation (responds to his name)   Memory: Decreased short-term memory;Decreased recall of precautions Following Commands: Follows one step commands inconsistently   Awareness: Intellectual Problem Solving: Slow processing;Decreased initiation;Requires verbal cues;Requires tactile cues General Comments: Pt with baseline dementia; inconsistently follows one-step commands with increased time and multimodal cues       Exercises      General Comments        Pertinent Vitals/Pain Pain Assessment: Faces Faces Pain Scale: Hurts a little  bit Pain Location: RLE with movement  Pain Descriptors / Indicators: Grimacing;Sore Pain Intervention(s): Monitored during session;Repositioned    Home Living                      Prior Function            PT Goals (current goals can now be found in the care plan section) Acute Rehab PT Goals Patient Stated Goal: none stated  Progress towards PT goals: Not progressing toward goals - comment (patient limited)     Frequency    Min 3X/week      PT Plan Current plan remains appropriate    Co-evaluation              AM-PAC PT "6 Clicks" Daily Activity  Outcome Measure  Difficulty turning over in bed (including adjusting bedclothes, sheets and blankets)?: Unable Difficulty moving from lying on back to sitting on the side of the bed? : Unable Difficulty sitting down on and standing up from a chair with arms (e.g., wheelchair, bedside commode, etc,.)?: Unable Help needed moving to and from a bed to chair (including a wheelchair)?: Total Help needed walking in hospital room?: Total Help needed climbing 3-5 steps with a railing? : Total 6 Click Score: 6    End of Session Equipment Utilized During Treatment:  (maximove sling.  ) Activity Tolerance: Patient limited by pain;Patient limited by fatigue;Other (comment) (hx of cognitive impairments.  ) Patient left: in bed;with call bell/phone within reach;with family/visitor present Nurse Communication: Mobility status;Need for lift equipment;Precautions PT Visit Diagnosis: Other abnormalities of gait and mobility (R26.89);Pain Pain - Right/Left: Right Pain - part of body: Hip     Time: 1244-1311 PT Time Calculation (min) (ACUTE ONLY): 27 min  Charges:  $Therapeutic Activity: 23-37 mins                    G Codes:       Governor Rooks, PTA pager [phone]    Cristela Blue 01/27/2017, 1:41 PM

## 2017-01-27 NOTE — Progress Notes (Signed)
  Speech Language Pathology Treatment: Dysphagia  Patient Details Name: Jimmy Solis MRN: [id number] DOB: May 29, 1928 Today's Date: 01/27/2017 Time: 9798-9211 SLP Time Calculation (min) (ACUTE ONLY): 22 min  Assessment / Plan / Recommendation Clinical Impression  ST follow up for therapeutic diet tolerance.  Chart review indicated that the patient has been febrile and intake has been variable.  Most recent chest xray dated 01/27/2017 is showing new bilateral airspace disease consistent with pneumonia or aspiration.  Patient had MBS in 2016 that showed penetration/aspiration of all textures.  Given PO intake following oral care today the patient continues to present with oropharyngeal dysphagia.  He is slow orally to move boluses and at times will orally hold boluses.  Multiple swallows are not inconsistently and more with nectar thick liquids.  The patient has intermittent throat clearing.  There is concern for aspiration given the clinical presentation and the patient's history.  Spoke with MD, who does not desire instrumental exam at this time.  ST will continue to follow for therapeutic diet tolerance and continued education to mitigate the risk of aspiration.   HPI HPI: Pt is an 83 y.o. male admitted with c/o R hip pain after reports of several falls at his SNF; subsequent x-rays shows R femoral neck fx. With h/o dementia, pt minimally interactive/contributory to hx. Pertinent PMH on file includes CVA ("lost vision in R eye"), prostate CA, depression, R eye blindness, Alzheimer's disease. Pt's swallowing was evaluated two years ago (MBS 01/19/15), at which time he had dysphagia with recommendations to be NPO due to significant pharyngeal residuals. Trials of purees were initiated as approved by MD with intermittent coughing still observed.      SLP Plan  Continue with current plan of care       Recommendations  Diet recommendations: Dysphagia 1 (puree);Nectar-thick liquid Liquids provided  via: Cup;Straw (small sips) Medication Administration: Crushed with puree Supervision: Trained caregiver to feed patient Compensations: Minimize environmental distractions;Slow rate;Small sips/bites Postural Changes and/or Swallow Maneuvers: Seated upright 90 degrees;Upright 30-60 min after meal                Oral Care Recommendations: Oral care QID;Oral care before and after PO Follow up Recommendations: Skilled Nursing facility SLP Visit Diagnosis: Dysphagia, oropharyngeal phase (R13.12) Plan: Continue with current plan of care       Jimmy Solis, [facility] [place], [place] [facility] SLP [phone]  Jimmy Solis 01/27/2017, 11:37 AM

## 2017-01-27 NOTE — Care Management Important Message (Signed)
Important Message  Patient Details  Name: Jimmy Solis MRN: [id number] Date of Birth: [birth year]   Medicare Important Message Given:  Yes    [name] 01/27/2017, 12:21 PM

## 2017-01-27 NOTE — Progress Notes (Signed)
PROGRESS NOTE    Jimmy Solis   ULA:[id number]  DOB: August 23, 1928  DOA: 01/23/2017 PCP: Cyndy Freeze, MD   Brief Narrative:  Jimmy Solis is a 83 y.o. male from ALF with medical history significant of dementia with behavioral disturbance, hypertension, stroke, prostate cancer, depression, central retinal artery occlusion, right eye blindness, abnormal gait, who presents with fall and right hip pain. Per report, pt fell twice in SNF. Pt was seen and discharged from ED 9/17 morning after being evaluated for fall. Staff reports that the patient got up and ran out of his wheelchair and fell after about 10-15 feet. He seems to have right hip pain.  Subjective: Not verbal again today  Assessment & Plan:   Principal Problem:   Closed right hip fracture -   Fall - s/p hemiarthroplasty - will need to go to SNF - Lovenox for DVT prophylaxis per Ortho  Active Problems:  - Fever 102.6 - UA negative on 9/18- no cough and no infiltrates on CXR on 9/17 - 100.1 Last night - CXR consistent with b/l pneumonia- ? Aspiration- allergic to PCN- start Levaquin  Dysphagia- new finding - on nectar thick at this time    Essential hypertension - Amlodipine    Dementia with behavioral disturbance - Depakote, Aricept, namenda, Remeron - needs to be fed- per RN, he is eating and drinking well  Dysphagia with thin liquids - per SLP eval this admission, he needs nectar thick liquids- SLP will follow and advance at tolerated    Stroke  - ASA 81 mg  Disposition - non-verbal, total care- recommend palliative care discussion as outpt    DVT prophylaxis: Lovenox Code Status: Full code Family Communication:  Disposition Plan: SNF tomorrow Consultants:   ortho Procedures:   9/18- ARTHROPLASTY BIPOLAR HIP (HEMIARTHROPLASTY) Antimicrobials:  Anti-infectives    Start     Dose/Rate Route Frequency Ordered Stop   01/24/17 1630  ceFAZolin (ANCEF) IVPB 1 g/50 mL premix     1 g 100 mL/hr  over 30 Minutes Intravenous Every 6 hours 01/24/17 1304 01/25/17 0425   01/24/17 0945  ceFAZolin (ANCEF) IVPB 2g/100 mL premix     2 g 200 mL/hr over 30 Minutes Intravenous On call to O.R. 01/24/17 0942 01/24/17 1025   01/24/17 0945  ceFAZolin (ANCEF) 2-4 GM/100ML-% IVPB    Comments:  Henrine Screws   : cabinet override      01/24/17 0945 01/24/17 1025       Objective: Vitals:   01/26/17 0753 01/26/17 1500 01/26/17 2140 01/27/17 0435  BP: (!) 110/47 (!) 105/50 (!) 109/34 (!) 106/48  Pulse: 85 87 93 61  Resp: 16 16 16 16   Temp: 98.1 F (36.7 C) 98.8 F (37.1 C) 100.1 F (37.8 C) 97.9 F (36.6 C)  TempSrc: Axillary Axillary Axillary Axillary  SpO2: 97% 90% 93% 97%    Intake/Output Summary (Last 24 hours) at 01/27/17 1520 Last data filed at 01/27/17 1300  Gross per 24 hour  Intake              680 ml  Output                0 ml  Net              680 ml   There were no vitals filed for this visit.  Examination: General exam: Appears comfortable  HEENT: PERRLA, oral mucosa moist, no sclera icterus or thrush Respiratory system: Clear to auscultation. Respiratory effort normal. Cardiovascular  system: S1 & S2 heard,  No murmurs  Gastrointestinal system: Abdomen soft, non-tender, nondistended. Normal bowel sound. No organomegaly Central nervous system: Alert - non-verbal and does not follow commands- moves arms and legs spontaneously  Extremities: No cyanosis, clubbing or edema Skin: No rashes or ulcers Psychiatry:  difficult to assess as he is non-verbal    Data Reviewed: I have personally reviewed following labs and imaging studies  CBC:  Recent Labs Lab 01/23/17 1124 01/23/17 2001 01/25/17 0605  WBC  --  6.6 8.5  NEUTROABS  --  3.9  --   HGB 12.2* 13.1 10.4*  HCT 36.0* 38.6* 31.5*  MCV  --  101.8* 101.9*  PLT  --  246 614   Basic Metabolic Panel:  Recent Labs Lab 01/23/17 1124 01/23/17 2133 01/25/17 0605  NA 143 140 137  K 3.6 5.6* 4.7  CL 101 102 104    CO2  --  28 25  GLUCOSE 87 99 119*  BUN 12 13 10   CREATININE 0.80 0.95 0.93  CALCIUM  --  9.1 8.4*   GFR: Estimated Creatinine Clearance: 55.2 mL/min (by C-G formula based on SCr of 0.93 mg/dL). Liver Function Tests:  Recent Labs Lab 01/23/17 2133  AST 53*  ALT 17  ALKPHOS 161*  BILITOT 1.5*  PROT 7.7  ALBUMIN 3.9   No results for input(s): LIPASE, AMYLASE in the last 168 hours. No results for input(s): AMMONIA in the last 168 hours. Coagulation Profile:  Recent Labs Lab 01/23/17 2348  INR 0.96   Cardiac Enzymes:  Recent Labs Lab 01/24/17 0338  CKTOTAL 226   BNP (last 3 results) No results for input(s): PROBNP in the last 8760 hours. HbA1C: No results for input(s): HGBA1C in the last 72 hours. CBG:  Recent Labs Lab 01/23/17 0948  GLUCAP 72   Lipid Profile: No results for input(s): CHOL, HDL, LDLCALC, TRIG, CHOLHDL, LDLDIRECT in the last 72 hours. Thyroid Function Tests: No results for input(s): TSH, T4TOTAL, FREET4, T3FREE, THYROIDAB in the last 72 hours. Anemia Panel: No results for input(s): VITAMINB12, FOLATE, FERRITIN, TIBC, IRON, RETICCTPCT in the last 72 hours. Urine analysis:    Component Value Date/Time   COLORURINE YELLOW 01/27/2017 1124   APPEARANCEUR CLEAR 01/27/2017 1124   LABSPEC 1.025 01/27/2017 1124   PHURINE 5.0 01/27/2017 1124   GLUCOSEU NEGATIVE 01/27/2017 1124   HGBUR NEGATIVE 01/27/2017 1124   BILIRUBINUR NEGATIVE 01/27/2017 1124   KETONESUR NEGATIVE 01/27/2017 1124   PROTEINUR 30 (A) 01/27/2017 1124   UROBILINOGEN 0.2 01/16/2015 2155   NITRITE NEGATIVE 01/27/2017 1124   LEUKOCYTESUR NEGATIVE 01/27/2017 1124   Sepsis Labs: @LABRCNTIP (procalcitonin:4,lacticidven:4) )No results found for this or any previous visit (from the past 240 hour(s)).       Radiology Studies: Dg Chest Port 1 View  Result Date: 01/27/2017 CLINICAL DATA:  Fever.  Recent hip surgery EXAM: PORTABLE CHEST 1 VIEW COMPARISON:  Four days ago FINDINGS:  There is airspace opacity at both bases, new/progressed. Low lung volumes with elevated right diaphragm. No edema, effusion, or pneumothorax. Stable heart size and mediastinal contours, distorted by rightward rotation. IMPRESSION: New bilateral airspace disease consistent with pneumonia or aspiration. Electronically Signed   By: Monte Fantasia M.D.   On: 01/27/2017 08:51      Scheduled Meds: . amLODipine  5 mg Oral Daily  . divalproex  250 mg Oral BID  . donepezil  10 mg Oral Daily  . enoxaparin (LOVENOX) injection  40 mg Subcutaneous Q24H  . escitalopram  10 mg Oral Daily  . feeding supplement (ENSURE ENLIVE)  237 mL Oral QID  . gabapentin  100 mg Oral BID  . lubriderm seriously sensitive  1 application Topical Daily  . memantine  10 mg Oral BID  . mirtazapine  15 mg Oral QHS  . multivitamin  1 tablet Oral Daily   Continuous Infusions: . tranexamic acid (CYKLOKAPRON) topical -INTRAOP       LOS: 4 days    Time spent in minutes: 35    Debbe Odea, MD [facility] Pager: [email or web address] Password [email or web address] 01/27/2017, 3:20 PM

## 2017-01-27 NOTE — Progress Notes (Signed)
All medications need to be crushed in apple sauce

## 2017-01-27 NOTE — Progress Notes (Signed)
   Assessment / Plan: 3 Days Post-Op  S/P Procedure(s) (LRB): ARTHROPLASTY BIPOLAR HIP (HEMIARTHROPLASTY) (Right) by Dr. Ernesta Amble. Murphy on 01/24/17  Principal Problem:   Closed right hip fracture [facility]) Active Problems:   Fall   Essential hypertension   Dementia with behavioral disturbance   Stroke [facility])   Depression  Right Femoral Neck Fracture s/p Hemiarthroplasty Stable from an orthopedic perspective.   Posterior Hip precautions Minimize Narcotic medicines in favor of Tylenol if able  Mobilize with therapy Incentive Spirometry Apply ice prn  Weight Bearing: Weight Bearing as Tolerated (WBAT) RLE, posterior hip precautions Dressings: PRN Mepilex.  VTE prophylaxis: Lovenox, SCDs, ambulation Dispo: Per primary.  SNF likely when stable medically.  Subjective: Patient minimally interactive dt dementia.  Denies pain.  Objective:   VITALS:   Vitals:   01/26/17 0753 01/26/17 1500 01/26/17 2140 01/27/17 0435  BP: (!) 110/47 (!) 105/50 (!) 109/34 (!) 106/48  Pulse: 85 87 93 61  Resp: 16 16 16 16   Temp: 98.1 F (36.7 C) 98.8 F (37.1 C) 100.1 F (37.8 C) 97.9 F (36.6 C)  TempSrc: Axillary Axillary Axillary Axillary  SpO2: 97% 90% 93% 97%   CBC Latest Ref Rng & Units 01/25/2017 01/23/2017 01/23/2017  WBC 4.0 - 10.5 K/uL 8.5 6.6 -  Hemoglobin 13.0 - 17.0 g/dL 10.4(L) 13.1 12.2(L)  Hematocrit 39.0 - 52.0 % 31.5(L) 38.6(L) 36.0(L)  Platelets 150 - 400 K/uL 209 246 -   BMP Latest Ref Rng & Units 01/25/2017 01/23/2017 01/23/2017  Glucose 65 - 99 mg/dL 119(H) 99 87  BUN 6 - 20 mg/dL 10 13 12   Creatinine 0.61 - 1.24 mg/dL 0.93 0.95 0.80  Sodium 135 - 145 mmol/L 137 140 143  Potassium 3.5 - 5.1 mmol/L 4.7 5.6(H) 3.6  Chloride 101 - 111 mmol/L 104 102 101  CO2 22 - 32 mmol/L 25 28 -  Calcium 8.9 - 10.3 mg/dL 8.4(L) 9.1 -   Intake/Output      09/20 0701 - 09/21 0700 09/21 0701 - 09/22 0700   P.O. 780    Total Intake 780     Urine 201    Total Output 201     Net +579            Urine Occurrence 3 x       Physical Exam: General: NAD.  Supine in bed.  [place] in place.  No increased WOB. MSK Sensation intact distally Feet warm Dorsiflexion/Plantar flexion intact Incision: dressing C/D/I   Prudencio Burly III, PA-C 01/27/2017, 7:51 AM

## 2017-01-28 LAB — CBC
HCT: 25 % — ABNORMAL LOW (ref 39.0–52.0)
Hemoglobin: 8 g/dL — ABNORMAL LOW (ref 13.0–17.0)
MCH: 33.2 pg (ref 26.0–34.0)
MCHC: 32 g/dL (ref 30.0–36.0)
MCV: 103.7 fL — ABNORMAL HIGH (ref 78.0–100.0)
PLATELETS: 197 10*3/uL (ref 150–400)
RBC: 2.41 MIL/uL — ABNORMAL LOW (ref 4.22–5.81)
RDW: 15.2 % (ref 11.5–15.5)
WBC: 6.5 10*3/uL (ref 4.0–10.5)

## 2017-01-28 MED ORDER — LEVOFLOXACIN 500 MG PO TABS
500.0000 mg | ORAL_TABLET | Freq: Every day | ORAL | Status: DC
  Administered 2017-01-28 – 2017-01-29 (×2): 500 mg via ORAL
  Filled 2017-01-28 (×2): qty 1

## 2017-01-28 NOTE — Progress Notes (Signed)
Orthopedic Trauma Service Progress Note   Patient ID: Jimmy Solis MRN: [id number] DOB/AGE: November 14, 1928 83 y.o.  Subjective:  Sleeping nonparticipatory in exam   No temp >100.1 in approximately 36 hrs    Review of Systems  Unable to perform ROS: Dementia    Objective:   VITALS:   Vitals:   01/27/17 0435 01/27/17 1300 01/27/17 2106 01/28/17 0631  BP: (!) 106/48 (!) 125/53 (!) 119/47 (!) 119/45  Pulse: 61 87 82 65  Resp: 16 16 19 19   Temp: 97.9 F (36.6 C) 98.9 F (37.2 C) 98.8 F (37.1 C) 99 F (37.2 C)  TempSrc: Axillary Oral Oral Oral  SpO2: 97% 97% 96% 96%    Estimated body mass index is 24.33 kg/m as calculated from the following:   Height as of an earlier encounter on 01/23/17: 5\' 8"  (1.727 m).   Weight as of an earlier encounter on 01/23/17: 72.6 kg (160 lb).   Intake/Output      09/21 0701 - 09/22 0700 09/22 0701 - 09/23 0700   P.O. 480    IV Piggyback 150    Total Intake 630     Urine 400    Total Output 400     Net +230            LABS  Results for orders placed or performed during the hospital encounter of 01/23/17 (from the past 24 hour(s))  Urinalysis, Routine w reflex microscopic     Status: Abnormal   Collection Time: 01/27/17 11:24 AM  Result Value Ref Range   Color, Urine YELLOW YELLOW   APPearance CLEAR CLEAR   Specific Gravity, Urine 1.025 1.005 - 1.030   pH 5.0 5.0 - 8.0   Glucose, UA NEGATIVE NEGATIVE mg/dL   Hgb urine dipstick NEGATIVE NEGATIVE   Bilirubin Urine NEGATIVE NEGATIVE   Ketones, ur NEGATIVE NEGATIVE mg/dL   Protein, ur 30 (A) NEGATIVE mg/dL   Nitrite NEGATIVE NEGATIVE   Leukocytes, UA NEGATIVE NEGATIVE   RBC / HPF 0-5 0 - 5 RBC/hpf   WBC, UA 0-5 0 - 5 WBC/hpf   Bacteria, UA RARE (A) NONE SEEN   Squamous Epithelial / LPF NONE SEEN NONE SEEN   Mucus PRESENT    Granular Casts, UA PRESENT      PHYSICAL EXAM:   Gen: sleeping, appears comfortable Ext:      Right Lower Extremity    Dressing stable  Incision stable, steri strips intact  Ext warm  Not participating with exam   + DP pulse   No significant swelling distally   Assessment/Plan: 4 Days Post-Op   Principal Problem:   Closed right hip fracture [facility]) Active Problems:   Fall   Essential hypertension   Dementia with behavioral disturbance   Stroke [facility])   Depression   Anti-infectives    Start     Dose/Rate Route Frequency Ordered Stop   01/27/17 1600  levofloxacin (LEVAQUIN) IVPB 750 mg     750 mg 100 mL/hr over 90 Minutes Intravenous Every 24 hours 01/27/17 1525     01/24/17 1630  ceFAZolin (ANCEF) IVPB 1 g/50 mL premix     1 g 100 mL/hr over 30 Minutes Intravenous Every 6 hours 01/24/17 1304 01/25/17 0425   01/24/17 0945  ceFAZolin (ANCEF) IVPB 2g/100 mL premix     2 g 200 mL/hr over 30 Minutes Intravenous On call to O.R. 01/24/17 3818 01/24/17 1025   01/24/17 0945  ceFAZolin (ANCEF) 2-4 GM/100ML-% IVPB    Comments:  Henrine Screws   : cabinet override      01/24/17 0945 01/24/17 1025    .  POD/HD#: 33  83 y/o male s/p R femoral neck fracture   - R femoral neck fracture s/p R hip hemiarthroplasty   WBAT  Posterior hip precautions   PT/OT  Dressing changes as needed  Ice prn    - Pain management:  Tylenol  Minimize narcs  - ABL anemia/Hemodynamics  Acute blood loss anemia  Expected for injury  Check CBC in am   If hgb drops < 8 would transfuse    - Medical issues   Per medical service   - DVT/PE prophylaxis:  Lovenox   - ID:   On levaquin for suspicion of aspiration PNA   - Dispo:  Ortho issues stable   Will need SNF    Jari Pigg, PA-C [facility] [phone] (P) [phone] (O) 01/28/2017, 10:03 AM

## 2017-01-28 NOTE — Progress Notes (Signed)
Unable to do admission due to patient being disoriented x4

## 2017-01-28 NOTE — Progress Notes (Addendum)
PROGRESS NOTE    [name]   HYW:[id number]  DOB: [birth year]  DOA: 01/23/2017 PCP: Cyndy Freeze, MD   Brief Narrative:  Jimmy Solis is a 83 y.o. male from ALF with medical history significant of dementia with behavioral disturbance, hypertension, stroke, prostate cancer, depression, central retinal artery occlusion, right eye blindness, abnormal gait, who presents with fall and right hip pain. Per report, pt fell twice in SNF. Pt was seen and discharged from ED 9/17 morning after being evaluated for fall. Staff reports that the patient got up and ran out of his wheelchair and fell after about 10-15 feet. He seems to have right hip pain.  Subjective: Non verbal. Asleep.  Assessment & Plan:   Principal Problem:   Closed right hip fracture -   Fall - s/p hemiarthroplasty - will need to go to SNF - Lovenox for DVT prophylaxis per Ortho  Active Problems:  - Fever 102.6 on 9/19- HCAP - UA negative on 9/18- no cough and no infiltrates on CXR on 9/17 - 9/21 CXR consistent with b/l pneumonia- ? Aspiration- allergic to PCN- started Levaquin - follow for recurrence of fever- likely d/c tomorrow if no fever  ABLA - Hb 12 >> 8.0- follow  Dysphagia- new finding - on nectar thick at this time    Hypotension with PMH of Essential hypertension - stop Amlodipine    Dementia with behavioral disturbance - Depakote, Aricept, namenda, Remeron - needs to be fed- per RN, he is eating and drinking well  Dysphagia with thin liquids - per SLP eval this admission, he needs nectar thick liquids- SLP will follow and advance at tolerated    Stroke  - ASA 81 mg  Disposition - non-verbal, total care- recommend palliative care discussion as outpt    DVT prophylaxis: Lovenox Code Status: Full code Family Communication:  Disposition Plan: SNF tomorrow Consultants:   ortho Procedures:   9/18- ARTHROPLASTY BIPOLAR HIP (HEMIARTHROPLASTY) Antimicrobials:  Anti-infectives    Start     Dose/Rate Route Frequency Ordered Stop   01/28/17 1600  levofloxacin (LEVAQUIN) tablet 500 mg     500 mg Oral Daily 01/28/17 1030     01/27/17 1600  levofloxacin (LEVAQUIN) IVPB 750 mg  Status:  Discontinued     750 mg 100 mL/hr over 90 Minutes Intravenous Every 24 hours 01/27/17 1525 01/28/17 1030   01/24/17 1630  ceFAZolin (ANCEF) IVPB 1 g/50 mL premix     1 g 100 mL/hr over 30 Minutes Intravenous Every 6 hours 01/24/17 1304 01/25/17 0425   01/24/17 0945  ceFAZolin (ANCEF) IVPB 2g/100 mL premix     2 g 200 mL/hr over 30 Minutes Intravenous On call to O.R. 01/24/17 0942 01/24/17 1025   01/24/17 0945  ceFAZolin (ANCEF) 2-4 GM/100ML-% IVPB    Comments:  Henrine Screws   : cabinet override      01/24/17 0945 01/24/17 1025       Objective: Vitals:   01/27/17 1300 01/27/17 2106 01/28/17 0631 01/28/17 1346  BP: (!) 125/53 (!) 119/47 (!) 119/45 (!) 92/55  Pulse: 87 82 65 69  Resp: 16 19 19 18   Temp: 98.9 F (37.2 C) 98.8 F (37.1 C) 99 F (37.2 C) 98.5 F (36.9 C)  TempSrc: Oral Oral Oral Oral  SpO2: 97% 96% 96% 91%    Intake/Output Summary (Last 24 hours) at 01/28/17 1559 Last data filed at 01/28/17 4854  Gross per 24 hour  Intake  150 ml  Output              400 ml  Net             -250 ml   There were no vitals filed for this visit.  Examination: General exam: Appears comfortable  HEENT: PERRLA, oral mucosa moist, no sclera icterus or thrush Respiratory system: Clear to auscultation. Respiratory effort normal. Cardiovascular system: S1 & S2 heard,  No murmurs  Gastrointestinal system: Abdomen soft, non-tender, nondistended. Normal bowel sound. No organomegaly Central nervous system: alseep- nonverbal and does not follow commands when awake. No focal neurological deficits. Extremities: No cyanosis, clubbing or edema Skin: No rashes or ulcers      Data Reviewed: I have personally reviewed following labs and imaging studies  CBC:  Recent  Labs Lab 01/23/17 1124 01/23/17 2001 01/25/17 0605 01/28/17 0953  WBC  --  6.6 8.5 6.5  NEUTROABS  --  3.9  --   --   HGB 12.2* 13.1 10.4* 8.0*  HCT 36.0* 38.6* 31.5* 25.0*  MCV  --  101.8* 101.9* 103.7*  PLT  --  246 209 737   Basic Metabolic Panel:  Recent Labs Lab 01/23/17 1124 01/23/17 2133 01/25/17 0605  NA 143 140 137  K 3.6 5.6* 4.7  CL 101 102 104  CO2  --  28 25  GLUCOSE 87 99 119*  BUN 12 13 10   CREATININE 0.80 0.95 0.93  CALCIUM  --  9.1 8.4*   GFR: Estimated Creatinine Clearance: 55.2 mL/min (by C-G formula based on SCr of 0.93 mg/dL). Liver Function Tests:  Recent Labs Lab 01/23/17 2133  AST 53*  ALT 17  ALKPHOS 161*  BILITOT 1.5*  PROT 7.7  ALBUMIN 3.9   No results for input(s): LIPASE, AMYLASE in the last 168 hours. No results for input(s): AMMONIA in the last 168 hours. Coagulation Profile:  Recent Labs Lab 01/23/17 2348  INR 0.96   Cardiac Enzymes:  Recent Labs Lab 01/24/17 0338  CKTOTAL 226   BNP (last 3 results) No results for input(s): PROBNP in the last 8760 hours. HbA1C: No results for input(s): HGBA1C in the last 72 hours. CBG:  Recent Labs Lab 01/23/17 0948  GLUCAP 72   Lipid Profile: No results for input(s): CHOL, HDL, LDLCALC, TRIG, CHOLHDL, LDLDIRECT in the last 72 hours. Thyroid Function Tests: No results for input(s): TSH, T4TOTAL, FREET4, T3FREE, THYROIDAB in the last 72 hours. Anemia Panel: No results for input(s): VITAMINB12, FOLATE, FERRITIN, TIBC, IRON, RETICCTPCT in the last 72 hours. Urine analysis:    Component Value Date/Time   COLORURINE YELLOW 01/27/2017 1124   APPEARANCEUR CLEAR 01/27/2017 1124   LABSPEC 1.025 01/27/2017 1124   PHURINE 5.0 01/27/2017 1124   GLUCOSEU NEGATIVE 01/27/2017 1124   HGBUR NEGATIVE 01/27/2017 1124   BILIRUBINUR NEGATIVE 01/27/2017 1124   KETONESUR NEGATIVE 01/27/2017 1124   PROTEINUR 30 (A) 01/27/2017 1124   UROBILINOGEN 0.2 01/16/2015 2155   NITRITE NEGATIVE  01/27/2017 1124   LEUKOCYTESUR NEGATIVE 01/27/2017 1124   Sepsis Labs: @LABRCNTIP (procalcitonin:4,lacticidven:4) )No results found for this or any previous visit (from the past 240 hour(s)).       Radiology Studies: Dg Chest Port 1 View  Result Date: 01/27/2017 CLINICAL DATA:  Fever.  Recent hip surgery EXAM: PORTABLE CHEST 1 VIEW COMPARISON:  Four days ago FINDINGS: There is airspace opacity at both bases, new/progressed. Low lung volumes with elevated right diaphragm. No edema, effusion, or pneumothorax. Stable heart size and mediastinal contours,  distorted by rightward rotation. IMPRESSION: New bilateral airspace disease consistent with pneumonia or aspiration. Electronically Signed   By: Monte Fantasia M.D.   On: 01/27/2017 08:51      Scheduled Meds: . amLODipine  5 mg Oral Daily  . divalproex  250 mg Oral BID  . donepezil  10 mg Oral Daily  . enoxaparin (LOVENOX) injection  40 mg Subcutaneous Q24H  . escitalopram  10 mg Oral Daily  . feeding supplement (ENSURE ENLIVE)  237 mL Oral QID  . gabapentin  100 mg Oral BID  . levofloxacin  500 mg Oral Daily  . lubriderm seriously sensitive  1 application Topical Daily  . memantine  10 mg Oral BID  . mirtazapine  15 mg Oral QHS  . multivitamin  1 tablet Oral Daily   Continuous Infusions: . tranexamic acid (CYKLOKAPRON) topical -INTRAOP       LOS: 5 days    Time spent in minutes: 35    Debbe Odea, MD [facility] Pager: [email or web address] Password [email or web address] 01/28/2017, 3:59 PM

## 2017-01-29 DIAGNOSIS — F0150 Vascular dementia without behavioral disturbance: Secondary | ICD-10-CM

## 2017-01-29 DIAGNOSIS — R5081 Fever presenting with conditions classified elsewhere: Secondary | ICD-10-CM

## 2017-01-29 DIAGNOSIS — J189 Pneumonia, unspecified organism: Secondary | ICD-10-CM

## 2017-01-29 DIAGNOSIS — D62 Acute posthemorrhagic anemia: Secondary | ICD-10-CM

## 2017-01-29 DIAGNOSIS — R1310 Dysphagia, unspecified: Secondary | ICD-10-CM

## 2017-01-29 LAB — BASIC METABOLIC PANEL
Anion gap: 8 (ref 5–15)
BUN: 25 mg/dL — ABNORMAL HIGH (ref 6–20)
CHLORIDE: 109 mmol/L (ref 101–111)
CO2: 26 mmol/L (ref 22–32)
CREATININE: 0.94 mg/dL (ref 0.61–1.24)
Calcium: 8.1 mg/dL — ABNORMAL LOW (ref 8.9–10.3)
GFR calc non Af Amer: >60 mL/min (ref >60)
Glucose, Bld: 107 mg/dL — ABNORMAL HIGH (ref 65–99)
Potassium: 3.7 mmol/L (ref 3.5–5.1)
Sodium: 143 mmol/L (ref 135–145)

## 2017-01-29 LAB — CBC
HEMATOCRIT: 26.6 % — AB (ref 39.0–52.0)
HEMOGLOBIN: 8.4 g/dL — AB (ref 13.0–17.0)
MCH: 32.6 pg (ref 26.0–34.0)
MCHC: 31.6 g/dL (ref 30.0–36.0)
MCV: 103.1 fL — ABNORMAL HIGH (ref 78.0–100.0)
Platelets: 246 10*3/uL (ref 150–400)
RBC: 2.58 MIL/uL — ABNORMAL LOW (ref 4.22–5.81)
RDW: 14.8 % (ref 11.5–15.5)
WBC: 8 10*3/uL (ref 4.0–10.5)

## 2017-01-29 MED ORDER — INFLUENZA VAC SPLIT HIGH-DOSE 0.5 ML IM SUSY: 0.5000 mL | PREFILLED_SYRINGE | INTRAMUSCULAR | Status: DC

## 2017-01-29 MED ORDER — FERROUS SULFATE 325 (65 FE) MG PO TABS: 325.0000 mg | ORAL_TABLET | Freq: Two times a day (BID) | ORAL | 0 refills | Status: AC

## 2017-01-29 MED ORDER — LEVOFLOXACIN 500 MG PO TABS: 500.0000 mg | ORAL_TABLET | Freq: Every day | ORAL | 0 refills | Status: AC

## 2017-01-29 MED ORDER — ENSURE ENLIVE PO LIQD: 237.0000 mL | Freq: Three times a day (TID) | ORAL | 12 refills | Status: AC

## 2017-01-29 NOTE — Discharge Summary (Addendum)
Physician Discharge Summary  Jimmy Solis FAO:[id number] DOB: [birth year] DOA: 01/23/2017  PCP: Cyndy Freeze, MD  Admit date: 01/23/2017 Discharge date: 01/29/2017  Admitted From: ALF Disposition:  SNF   Recommendations for Outpatient Follow-up:  1. Stop Levaquin after 9/28 2. Needs to be fed - needs to be offered drinks at least 4-5 x day to prevent dehydration 3. Cont speech f/u for dysphagia/ aspiration risk 4. Follow for constipation from pain meds 5. Amlodipine on hold- follow BPs 6. Need a palliative care discussion with his legal guardian in regards to code status       Discharge Condition: stable  CODE STATUS:  Full code   Diet recommendation:  Regular diet with nectar thick liquids- Ensure BID Consultations:  ortho    Discharge Diagnoses:  Principal Problem:   Closed right hip fracture/   Fall Active Problems:   Acute blood loss anemia   HCAP (healthcare-associated pneumonia)   Dysphagia   Essential hypertension   Vascular dementia   Dementia with behavioral disturbance   Stroke [facility])   Depression     Subjective: Alert, pleasant, confused. No complaints.   Brief Summary: Jimmy Solis is an 83 y.o.male from [place] history significant of dementia with behavioraldisturbance, hypertension, stroke, prostate cancer, depression, central retinal artery occlusion, right eye blindness, abnormal gait, who presents with fall and right hip pain. Per report, pt fell twice in SNF. Pt was seen and discharged from ED 9/17 morning after being evaluated for fall. Staff reports that the patient got up and ran out of his wheelchair and fell after about 10-15 feet. He was noted to have right hip pain and is found to have a fracture.   Hospital Course:  Principal Problem:   Closed right hip fracture -   Fall - s/p hemiarthroplasty - will need to go to SNF - Lovenox for DVT prophylaxis per Ortho  Active Problems:  ABLA - Hb 12 >> 8.0- has not required a  blood transfusion- started on oral Iron  - Fever 102.6 on 9/19- HCAP - UA negative on 9/18- no cough and no infiltrates on CXR on 9/17 - 9/21 CXR consistent with b/l pneumonia- ? Aspiration related to intubation - allergic to PCN- started Levaquin  - fevers have resoloved - moderate cough persists   Dysphagia- new finding - per SLP eval this admission, he needs nectar thick liquids- SLP should follow and advance at tolerated    Hypotension with PMH of Essential hypertension - hold Amlodipine and follow BPs    Dementia with behavioral disturbance - overall pleasant and calm but has severe memory deficits - Depakote, Aricept, namenda, Remeron - needs to be fed- per RN, he is eating and drinking well  Stroke  - ASA 81 mg      Discharge Instructions  Discharge Instructions    Diet general    Complete by:  As directed    Increase activity slowly    Complete by:  As directed      Allergies as of 01/29/2017      Reactions   Penicillins Hives, Other (See Comments)   Tolerated Ceftin Has patient had a PCN reaction causing immediate rash, facial/tongue/throat swelling, SOB or lightheadedness with hypotension: Unsure Has patient had a PCN reaction causing severe rash involving mucus membranes or skin necrosis: Unsure Has patient had a PCN reaction that required hospitalization Unsure Has patient had a PCN reaction occurring within the last 10 years: Unsure If all of the above answers are "NO",  then may proceed with Cephalosporin use.   Ativan [lorazepam] Other (See Comments)   Reaction:  Hallucinations       Medication List    STOP taking these medications   guaifenesin 100 MG/5ML syrup Commonly known as:  ROBITUSSIN   loperamide 2 MG tablet Commonly known as:  IMODIUM A-D     TAKE these medications   acetaminophen 500 MG tablet Commonly known as:  TYLENOL Take 500 mg by mouth every 4 (four) hours as needed for moderate pain.   amLODipine 5 MG tablet Commonly  known as:  NORVASC Take 1 tablet (5 mg total) by mouth daily.   aspirin EC 81 MG tablet Take 81 mg by mouth daily.   divalproex 250 MG DR tablet Commonly known as:  DEPAKOTE Take 250 mg by mouth 2 (two) times daily.   donepezil 10 MG tablet Commonly known as:  ARICEPT Take 10 mg by mouth daily.   enoxaparin 40 MG/0.4ML injection Commonly known as:  LOVENOX Inject 0.4 mLs (40 mg total) into the skin daily. For 30 days post op for DVT prophylaxis   escitalopram 10 MG tablet Commonly known as:  LEXAPRO TAKE 1 TABLET BY MOUTH DAILY What changed:  See the new instructions.   feeding supplement (ENSURE ENLIVE) Liqd Take 237 mLs by mouth 3 (three) times daily between meals.   ferrous sulfate 325 (65 FE) MG tablet Take 1 tablet (325 mg total) by mouth 2 (two) times daily with a meal.   gabapentin 100 MG capsule Commonly known as:  NEURONTIN Take 100 mg by mouth 2 (two) times daily.   HYDROcodone-acetaminophen 5-325 MG tablet Commonly known as:  NORCO Take 1 tablet by mouth every 6 (six) hours as needed for moderate pain.   levofloxacin 500 MG tablet Commonly known as:  LEVAQUIN Take 1 tablet (500 mg total) by mouth daily.   lubriderm seriously sensitive Lotn Apply 1 application topically daily.   magnesium hydroxide 400 MG/5ML suspension Commonly known as:  MILK OF MAGNESIA Take 30 mLs by mouth at bedtime as needed for mild constipation.   memantine 10 MG tablet Commonly known as:  NAMENDA TAKE 1 TABLET (10 MG TOTAL) BY MOUTH 2 (TWO) TIMES DAILY.   Churchill 200-200-20 MG/5ML suspension Generic drug:  alum & mag hydroxide-simeth Take 30 mLs by mouth every 6 (six) hours as needed for indigestion or heartburn.   mirtazapine 15 MG tablet Commonly known as:  REMERON Take 15 mg by mouth at bedtime.   neomycin-bacitracin-polymyxin [phone] ointment Apply 1 application topically daily as needed (skin tear). Clean area with normal saline, apply neosporin and cover with  band-air or guaze and tape change as needed until healed.   PRESERVISION AREDS 2 PO Take 1 capsule by mouth 2 (two) times daily.   senna-docusate 8.6-50 MG tablet Commonly known as:  Senokot-S Take 1 tablet by mouth at bedtime as needed for mild constipation.   traZODone 50 MG tablet Commonly known as:  DESYREL Take 50 mg by mouth at bedtime as needed for sleep. What changed:  Another medication with the same name was removed. Continue taking this medication, and follow the directions you see here.            Discharge Care Instructions        Start     Ordered   01/29/17 0000  levofloxacin (LEVAQUIN) 500 MG tablet  Daily     01/29/17 1031   01/29/17 0000  Increase activity slowly  01/29/17 1031   01/29/17 0000  Diet general     01/29/17 1031   01/29/17 0000  ferrous sulfate 325 (65 FE) MG tablet  2 times daily with meals     01/29/17 1034   01/29/17 0000  feeding supplement, ENSURE ENLIVE, (ENSURE ENLIVE) LIQD  3 times daily between meals     01/29/17 1045   01/25/17 0000  senna-docusate (SENOKOT-S) 8.6-50 MG tablet  At bedtime PRN     01/25/17 1538   01/24/17 0000  HYDROcodone-acetaminophen (NORCO) 5-325 MG tablet  Every 6 hours PRN     01/24/17 1148   01/24/17 0000  enoxaparin (LOVENOX) 40 MG/0.4ML injection  Every 24 hours     01/24/17 1149     Follow-up Information    Renette Butters, MD Follow up in 2 week(s).   Specialty:  Orthopedic Surgery Contact information: [address] [place] [address] [phone]          Allergies  Allergen Reactions  . Penicillins Hives and Other (See Comments)    Tolerated Ceftin Has patient had a PCN reaction causing immediate rash, facial/tongue/throat swelling, SOB or lightheadedness with hypotension: Unsure Has patient had a PCN reaction causing severe rash involving mucus membranes or skin necrosis: Unsure Has patient had a PCN reaction that required hospitalization Unsure Has patient had  a PCN reaction occurring within the last 10 years: Unsure If all of the above answers are "NO", then may proceed with Cephalosporin use.  . Ativan [Lorazepam] Other (See Comments)    Reaction:  Hallucinations      Procedures/Studies:  9/18- ARTHROPLASTY BIPOLAR HIP (HEMIARTHROPLASTY)  Dg Chest 1 View  Result Date: 01/23/2017 CLINICAL DATA:  Status post fall. EXAM: CHEST 1 VIEW COMPARISON:  May 01, 2016 FINDINGS: The mediastinal contour is normal. The heart size is mildly enlarged. There is chronic elevation of right hemidiaphragm. There is a small right pleural effusion with mild subjacent atelectasis. The left lung is clear. The bony structures are stable. IMPRESSION: Chronic elevation of the right hemidiaphragm. Small right pleural effusion with mild subjacent atelectasis. Electronically Signed   By: Abelardo Diesel M.D.   On: 01/23/2017 19:59   Dg Tibia/fibula Right  Result Date: 01/23/2017 CLINICAL DATA:  Leg pain, fall from wheelchair EXAM: RIGHT TIBIA AND FIBULA - 2 VIEW COMPARISON:  None. FINDINGS: The study is limited by patient mobility and positioning. No definite acute displaced fracture or malalignment is seen. There are vascular calcifications. Sock artifact over the foot. IMPRESSION: No definite acute osseous abnormality. Electronically Signed   By: Donavan Foil M.D.   On: 01/23/2017 21:05   Ct Head Wo Contrast  Result Date: 01/23/2017 CLINICAL DATA:  Patient BIB EMS from [facility]. Staff reported the patient "got up and took off running out of his wheelchair, and fell after about 10-26ft." Staff reported patient did not hit his head or lose consciousness. Staff reports patient is not on blood thinners. Patient has history of dementia. Patient c/o pain to right leg. Shortening and rotation noted to right lower extremity. Patient is high fall risk and was discharged from [facility] last night for another fall. EXAM: CT HEAD WITHOUT CONTRAST CT CERVICAL SPINE WITHOUT CONTRAST  TECHNIQUE: Multidetector CT imaging of the head and cervical spine was performed following the standard protocol without intravenous contrast. Multiplanar CT image reconstructions of the cervical spine were also generated. COMPARISON:  12/22/2016 FINDINGS: CT HEAD FINDINGS Brain: No evidence of acute infarction, hemorrhage, hydrocephalus, extra-axial collection  or mass lesion/mass effect. Moderate atrophy and extensive white matter hypoattenuation consistent with advanced chronic microvascular ischemic change, stable from previous day's study. Vascular: No hyperdense vessel or unexpected calcification. Skull: Normal. Negative for fracture or focal lesion. Sinuses/Orbits: No acute abnormality of the globes and orbits. Sinuses and mastoid air cells are clear. Other: None. CT CERVICAL SPINE FINDINGS Alignment: Exaggerated lordosis. Mild degenerative anterolisthesis of C7 on T1. Skull base and vertebrae: No acute fracture. No primary bone lesion or focal pathologic process. Soft tissues and spinal canal: No prevertebral fluid or swelling. No visible canal hematoma. Disc levels: Moderate loss of disc height at C3-C4. Mild loss of disc height at C4-C5. Marked loss of disc height at C5-C6, C6-C7 and C7-T1. There is spondylotic disc bulging with endplate spurring at these levels. Bilateral facet degenerative changes are noted from C2-C3 through C7-T1. There are varying degrees of neural foraminal narrowing due to facet and uncovertebral spurring. No convincing disc herniation. Upper chest: No acute findings. Other: None. IMPRESSION: HEAD CT 1. No acute intracranial abnormalities. 2. Atrophy and advanced chronic microvascular ischemic change. Stable appearance from the previous day's study. CERVICAL CT 1. No fracture or acute findings. 2. Advanced degenerative changes stable from the prior cervical CT. Electronically Signed   By: Lajean Manes M.D.   On: 01/23/2017 20:40   Ct Cervical Spine Wo Contrast  Result Date:  01/23/2017 CLINICAL DATA:  Patient BIB EMS from [facility]. Staff reported the patient "got up and took off running out of his wheelchair, and fell after about 10-53ft." Staff reported patient did not hit his head or lose consciousness. Staff reports patient is not on blood thinners. Patient has history of dementia. Patient c/o pain to right leg. Shortening and rotation noted to right lower extremity. Patient is high fall risk and was discharged from [facility] last night for another fall. EXAM: CT HEAD WITHOUT CONTRAST CT CERVICAL SPINE WITHOUT CONTRAST TECHNIQUE: Multidetector CT imaging of the head and cervical spine was performed following the standard protocol without intravenous contrast. Multiplanar CT image reconstructions of the cervical spine were also generated. COMPARISON:  12/22/2016 FINDINGS: CT HEAD FINDINGS Brain: No evidence of acute infarction, hemorrhage, hydrocephalus, extra-axial collection or mass lesion/mass effect. Moderate atrophy and extensive white matter hypoattenuation consistent with advanced chronic microvascular ischemic change, stable from previous day's study. Vascular: No hyperdense vessel or unexpected calcification. Skull: Normal. Negative for fracture or focal lesion. Sinuses/Orbits: No acute abnormality of the globes and orbits. Sinuses and mastoid air cells are clear. Other: None. CT CERVICAL SPINE FINDINGS Alignment: Exaggerated lordosis. Mild degenerative anterolisthesis of C7 on T1. Skull base and vertebrae: No acute fracture. No primary bone lesion or focal pathologic process. Soft tissues and spinal canal: No prevertebral fluid or swelling. No visible canal hematoma. Disc levels: Moderate loss of disc height at C3-C4. Mild loss of disc height at C4-C5. Marked loss of disc height at C5-C6, C6-C7 and C7-T1. There is spondylotic disc bulging with endplate spurring at these levels. Bilateral facet degenerative changes are noted from C2-C3 through C7-T1. There are varying  degrees of neural foraminal narrowing due to facet and uncovertebral spurring. No convincing disc herniation. Upper chest: No acute findings. Other: None. IMPRESSION: HEAD CT 1. No acute intracranial abnormalities. 2. Atrophy and advanced chronic microvascular ischemic change. Stable appearance from the previous day's study. CERVICAL CT 1. No fracture or acute findings. 2. Advanced degenerative changes stable from the prior cervical CT. Electronically Signed   By: Dedra Skeens.D.  On: 01/23/2017 20:40   Dg Chest Port 1 View  Result Date: 01/27/2017 CLINICAL DATA:  Fever.  Recent hip surgery EXAM: PORTABLE CHEST 1 VIEW COMPARISON:  Four days ago FINDINGS: There is airspace opacity at both bases, new/progressed. Low lung volumes with elevated right diaphragm. No edema, effusion, or pneumothorax. Stable heart size and mediastinal contours, distorted by rightward rotation. IMPRESSION: New bilateral airspace disease consistent with pneumonia or aspiration. Electronically Signed   By: Monte Fantasia M.D.   On: 01/27/2017 08:51   Dg Knee Complete 4 Views Right  Result Date: 01/23/2017 CLINICAL DATA:  [address] out of wheelchair with pain to the leg EXAM: RIGHT KNEE - COMPLETE 4+ VIEW COMPARISON:  01/23/2017 FINDINGS: Mild to moderate patellofemoral degenerative changes. Mild to moderate medial and lateral degenerative changes. No acute displaced fracture. Vascular calcifications. No large effusion. IMPRESSION: Degenerative changes.  No definite acute osseous abnormality. Electronically Signed   By: Donavan Foil M.D.   On: 01/23/2017 21:02   Dg Hip Unilat With Pelvis 1v Right  Result Date: 01/24/2017 CLINICAL DATA:  Right hip arthroplasty. EXAM: DG HIP (WITH OR WITHOUT PELVIS) 1V RIGHT COMPARISON:  Radiography from yesterday FINDINGS: Bipolar right hip hemiarthroplasty is located in this single projection. No visible periprosthetic fracture. Changes of pelvic lymphadenectomy and hernia repair. IMPRESSION: Right  hip hemiarthroplasty without visible complication. Electronically Signed   By: Monte Fantasia M.D.   On: 01/24/2017 19:27   Dg Hip Unilat  With Pelvis 2-3 Views Right  Result Date: 01/23/2017 CLINICAL DATA:  Status post fall. EXAM: DG HIP (WITH OR WITHOUT PELVIS) 2-3V RIGHT COMPARISON:  None. FINDINGS: There is deformity of the right proximal femur at the junction of femoral head and neck is consistent with fracture. Degenerative joint changes of the spine are noted. IMPRESSION: Deformity of the right proximal femur at the junction of femoral head and neck is consistent with fracture. Electronically Signed   By: Abelardo Diesel M.D.   On: 01/23/2017 19:58       Discharge Exam: Vitals:   01/28/17 2102 01/29/17 0513  BP: (!) 118/50 (!) 131/48  Pulse: 63 91  Resp: 15 18  Temp: 98.5 F (36.9 C) 97.8 F (36.6 C)  SpO2: 95% 93%   Vitals:   01/28/17 0631 01/28/17 1346 01/28/17 2102 01/29/17 0513  BP: (!) 119/45 (!) 92/55 (!) 118/50 (!) 131/48  Pulse: 65 69 63 91  Resp: 19 18 15 18   Temp: 99 F (37.2 C) 98.5 F (36.9 C) 98.5 F (36.9 C) 97.8 F (36.6 C)  TempSrc: Oral Oral Oral Oral  SpO2: 96% 91% 95% 93%  Weight:    72.6 kg (160 lb 0.9 oz)  Height:    6' (1.829 m)    General: Pt is alert, awake, not in acute distress Cardiovascular: RRR, S1/S2 +, no rubs, no gallops Respiratory: CTA bilaterally, no wheezing, no rhonchi Abdominal: Soft, NT, ND, bowel sounds + Extremities: no edema, no cyanosis    The results of significant diagnostics from this hospitalization (including imaging, microbiology, ancillary and laboratory) are listed below for reference.     Microbiology: No results found for this or any previous visit (from the past 240 hour(s)).   Labs: BNP (last 3 results) No results for input(s): BNP in the last 8760 hours. Basic Metabolic Panel:  Recent Labs Lab 01/23/17 1124 01/23/17 2133 01/25/17 0605 01/29/17 0306  NA 143 140 137 143  K 3.6 5.6* 4.7 3.7  CL  101 102 104 109  CO2  --  28 25 26   GLUCOSE 87 99 119* 107*  BUN 12 13 10  25*  CREATININE 0.80 0.95 0.93 0.94  CALCIUM  --  9.1 8.4* 8.1*   Liver Function Tests:  Recent Labs Lab 01/23/17 2133  AST 53*  ALT 17  ALKPHOS 161*  BILITOT 1.5*  PROT 7.7  ALBUMIN 3.9   No results for input(s): LIPASE, AMYLASE in the last 168 hours. No results for input(s): AMMONIA in the last 168 hours. CBC:  Recent Labs Lab 01/23/17 1124 01/23/17 2001 01/25/17 0605 01/28/17 0953 01/29/17 0306  WBC  --  6.6 8.5 6.5 8.0  NEUTROABS  --  3.9  --   --   --   HGB 12.2* 13.1 10.4* 8.0* 8.4*  HCT 36.0* 38.6* 31.5* 25.0* 26.6*  MCV  --  101.8* 101.9* 103.7* 103.1*  PLT  --  246 209 197 246   Cardiac Enzymes:  Recent Labs Lab 01/24/17 0338  CKTOTAL 226   BNP: Invalid input(s): POCBNP CBG:  Recent Labs Lab 01/23/17 0948  GLUCAP 72   D-Dimer No results for input(s): DDIMER in the last 72 hours. Hgb A1c No results for input(s): HGBA1C in the last 72 hours. Lipid Profile No results for input(s): CHOL, HDL, LDLCALC, TRIG, CHOLHDL, LDLDIRECT in the last 72 hours. Thyroid function studies No results for input(s): TSH, T4TOTAL, T3FREE, THYROIDAB in the last 72 hours.  Invalid input(s): FREET3 Anemia work up No results for input(s): VITAMINB12, FOLATE, FERRITIN, TIBC, IRON, RETICCTPCT in the last 72 hours. Urinalysis    Component Value Date/Time   COLORURINE YELLOW 01/27/2017 1124   APPEARANCEUR CLEAR 01/27/2017 1124   LABSPEC 1.025 01/27/2017 1124   PHURINE 5.0 01/27/2017 1124   GLUCOSEU NEGATIVE 01/27/2017 1124   HGBUR NEGATIVE 01/27/2017 1124   BILIRUBINUR NEGATIVE 01/27/2017 1124   KETONESUR NEGATIVE 01/27/2017 1124   PROTEINUR 30 (A) 01/27/2017 1124   UROBILINOGEN 0.2 01/16/2015 2155   NITRITE NEGATIVE 01/27/2017 1124   LEUKOCYTESUR NEGATIVE 01/27/2017 1124   Sepsis Labs Invalid input(s): PROCALCITONIN,  WBC,  LACTICIDVEN Microbiology No results found for this or any  previous visit (from the past 240 hour(s)).   Time coordinating discharge: Over 30 minutes  SIGNED:   Debbe Odea, MD  [facility] 01/29/2017, 10:45 AM Pager   If 7PM-7AM, please contact night-coverage [email or web address] Password [email or web address]

## 2017-01-29 NOTE — Progress Notes (Signed)
CSW spoke with Diane, Transitional Care Nurse from [place] regarding bed availability. Per Diane, patient has been accepted and can be transported to facility today at 3pm by ambulance. Discharge summary was sent via [facility] system. CSW contacted legal guardian, [name] to inform of transport time and provided facility contact number. No other concerns were reported at this time. CSW to arrange for transportation for 3pm and inform assigned RN.  CSW to sign off.  Madilyn Fireman, MSW, LCSW-A Weekend Clinical Social Worker [phone]

## 2017-01-29 NOTE — Clinical Social Work Placement (Addendum)
   CLINICAL SOCIAL WORK PLACEMENT  NOTE  Date:  01/29/2017  Patient Details  Name: Jimmy Solis MRN: [id number] Date of Birth: 1928/09/10  Clinical Social Work is seeking post-discharge placement for this patient at the [place] level of care (*CSW will initial, date and re-position this form in  chart as items are completed):  Yes   Patient/family provided with [facility] Work Department's list of facilities offering this level of care within the geographic area requested by the patient (or if unable, by the patient's family).  Yes   Patient/family informed of their freedom to choose among providers that offer the needed level of care, that participate in Medicare, Medicaid or managed care program needed by the patient, have an available bed and are willing to accept the patient.  Yes   Patient/family informed of [facility]'s ownership interest in [facility] and [facility], as well as of the fact that they are under no obligation to receive care at these facilities.  PASRR submitted to EDS on       PASRR number received on 01/24/17     Existing PASRR number confirmed on 01/24/17     FL2 transmitted to all facilities in geographic area requested by pt/family on 01/24/17     FL2 transmitted to all facilities within larger geographic area on       Patient informed that his/her managed care company has contracts with or will negotiate with certain facilities, including the following:          01/29/2017  Patient/family informed of bed offers received.  Patient chooses bed at [facility]     Physician recommends and patient chooses bed at      Patient to be transferred to [facility] on 01/29/17.  Patient to be transferred to facility by  [name])     Patient family notified on 01/29/17 of transfer.  Name of family member notified:  [name] (Legal Guardian)     PHYSICIAN       Additional Comment:     _______________________________________________ Archie Endo, [place] 01/29/2017, 1:48 PM

## 2017-01-29 NOTE — Progress Notes (Signed)
[facility] called to [facility]. Nurse taking report states she doesn't have any questions. Patient sent with yellow foam for posterior precautions. All belongings packed, awaiting PTAR

## 2017-03-09 DEATH — deceased

## 2019-03-22 IMAGING — CT CT HEAD W/O CM
3 of 8 series · 12 of 47 positions shown, 14 images · non-contrast
Comparison: 09/10/2016 CT of head and cervical spine.

CLINICAL DATA: 88 y/o  M; witnessed fall.  Injury to back head.

EXAM:
CT HEAD WITHOUT CONTRAST
CT CERVICAL SPINE WITHOUT CONTRAST
TECHNIQUE: Multidetector CT imaging of the head and cervical spine was
performed following the standard protocol without intravenous
contrast. Multiplanar CT image reconstructions of the cervical spine
were also generated.

[Series 11: axial recon · axial · 0.23mm/px · z∈[+1360,+1506]mm · 7 of 93 slices shown, 9 images]
[im 10/93  brain]
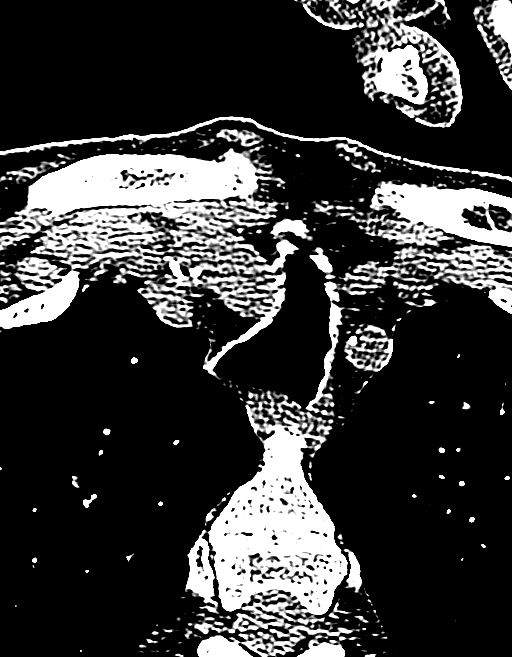
[im 10/93  bone]
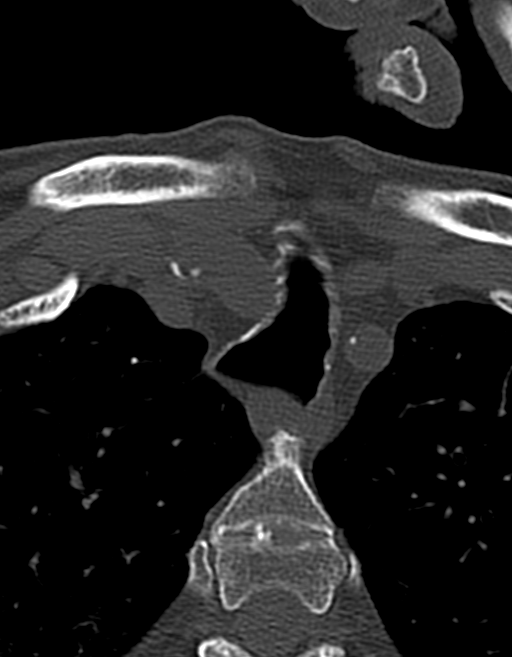
[im 19/93  brain]
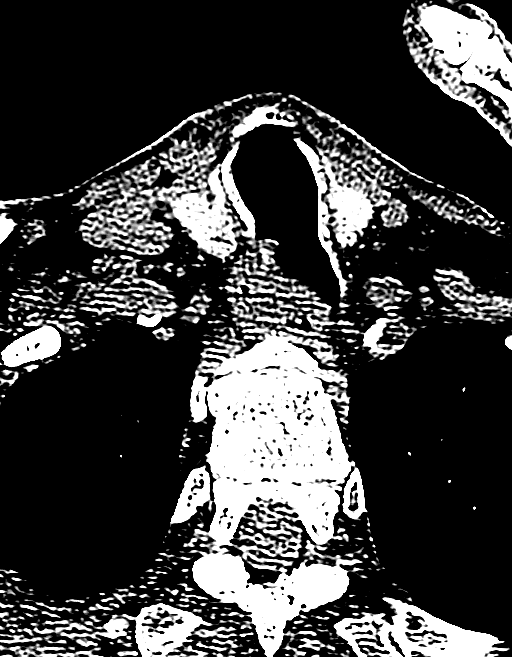
[im 37/93  brain]
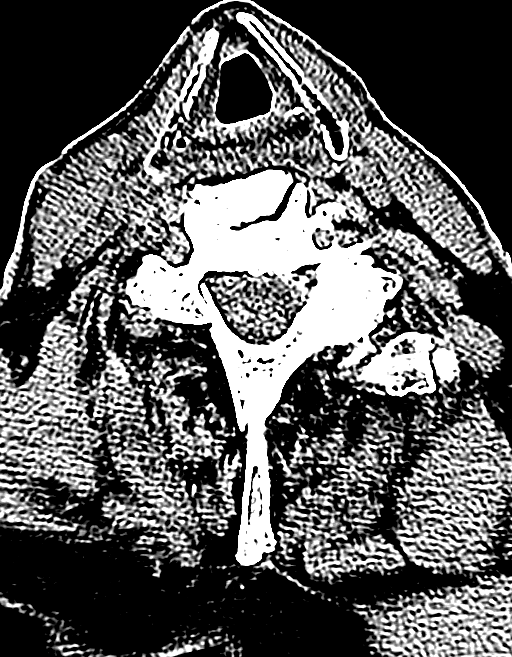
[im 47/93  brain]
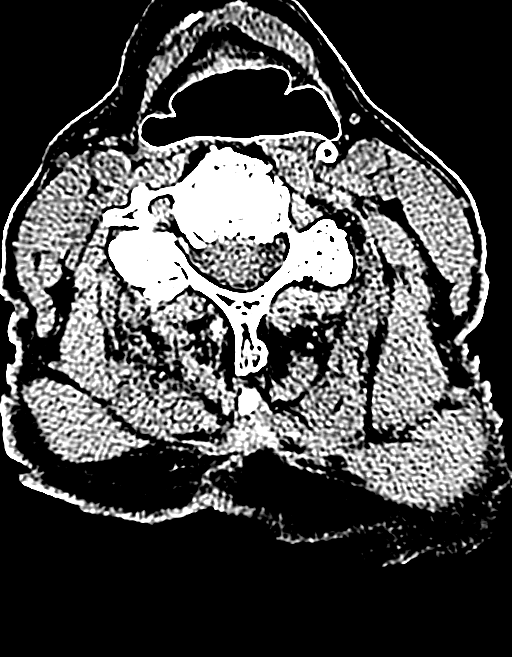
[im 56/93  brain]
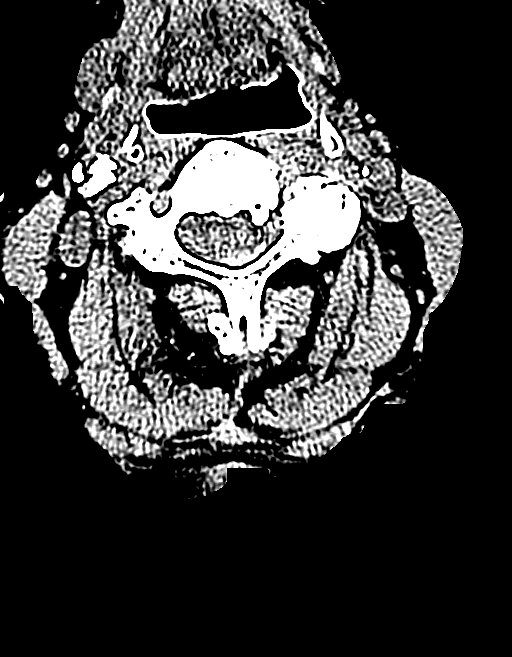
[im 56/93  bone]
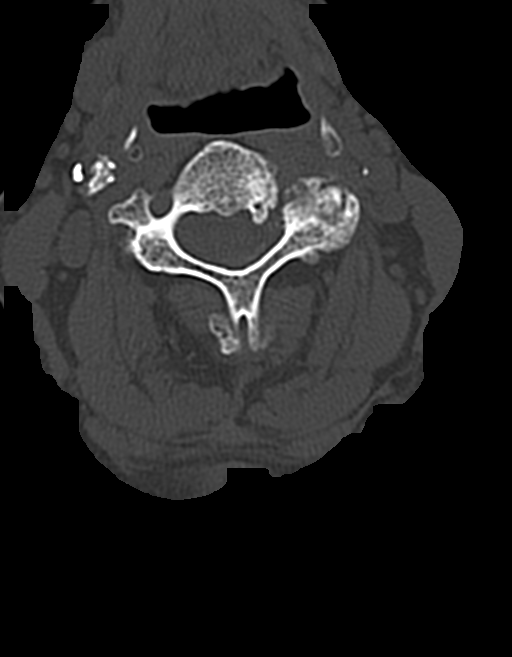
[im 74/93  brain]
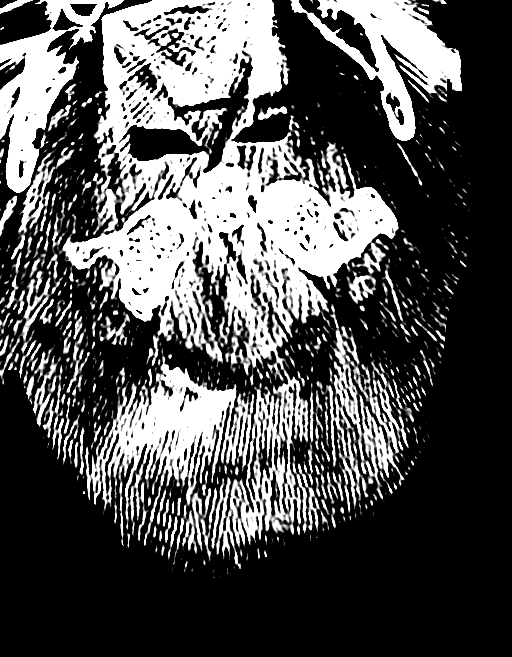
[im 83/93  brain]
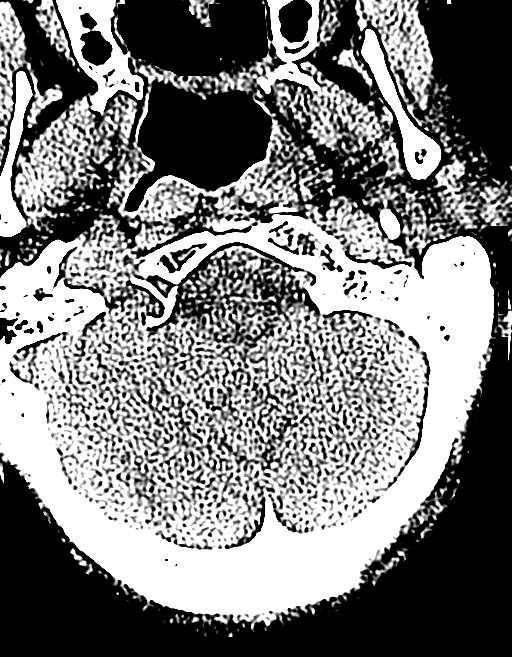

[Series 14: coronal · coronal · 0.23mm/px · 3 of 78 slices shown]
[im 23/78  brain]
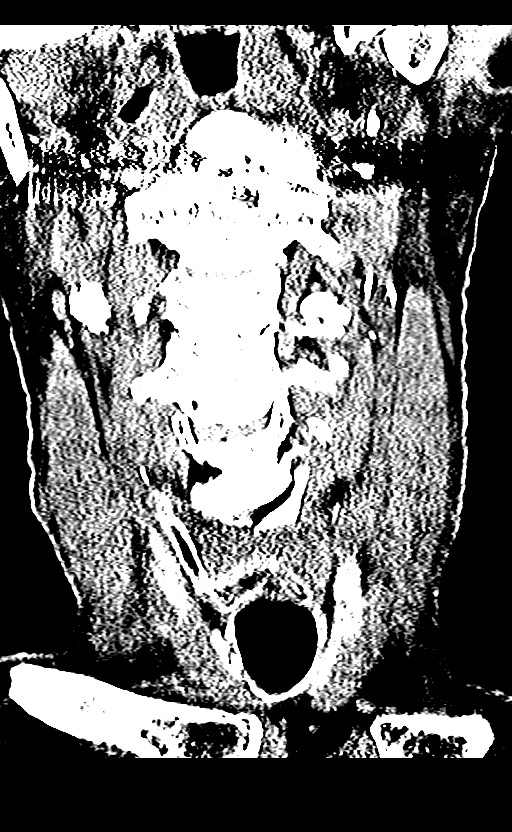
[im 34/78  brain]
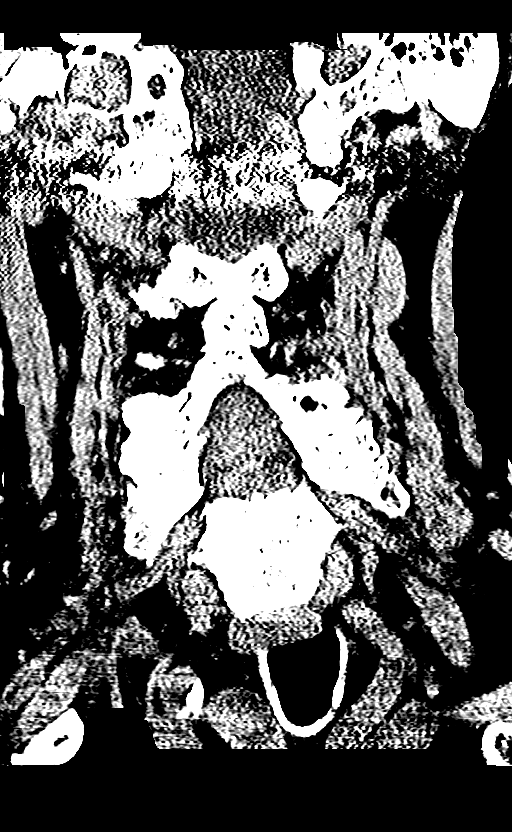
[im 45/78  brain]
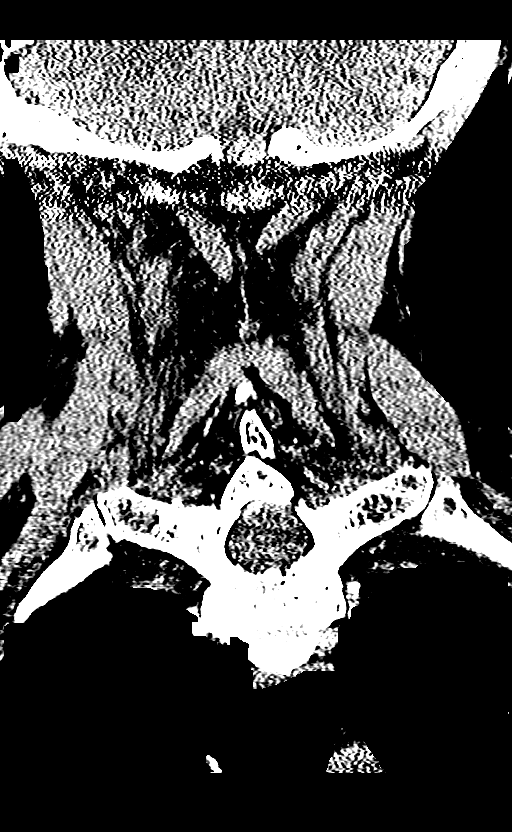

[Series 16: sagittal · sagittal · 0.34mm/px · 2 of 61 slices shown]
[im 21/61  brain]
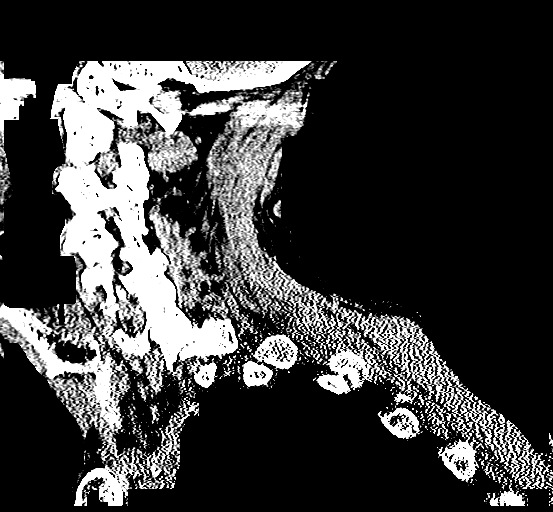
[im 41/61  brain]
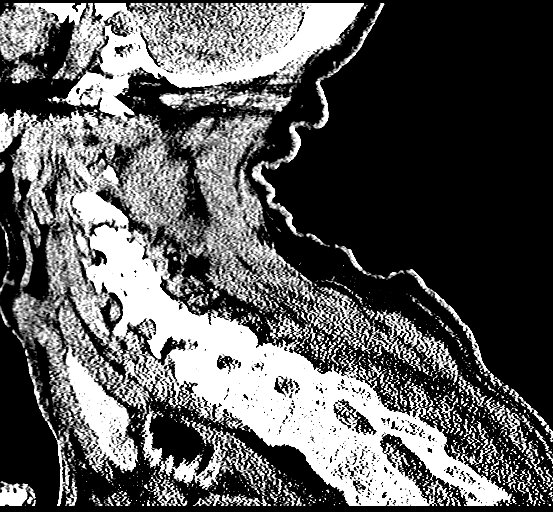

[12 of 47 positions shown; findings below may reference images not displayed]

FINDINGS: CT HEAD FINDINGS

Brain: No evidence of acute infarction, hemorrhage, hydrocephalus,
extra-axial collection or mass lesion/mass effect. Advanced chronic
microvascular ischemic changes and parenchymal volume loss of the
brain are stable.

Vascular: Calcific atherosclerosis of carotid siphons. No hyperdense
vessel.

Skull: Normal. Negative for fracture or focal lesion.

Sinuses/Orbits: No acute finding.

Other: Left ocular banding and bilateral intra-ocular lens
replacement.

CT CERVICAL SPINE FINDINGS

Alignment: Exaggeration of cervical lordosis and mild rightward
curvature. Stable C7-T1 grade 1 anterolisthesis.

Skull base and vertebrae: No acute fracture. No primary bone lesion
or focal pathologic process.

Soft tissues and spinal canal: No prevertebral fluid or swelling. No
visible canal hematoma.

Disc levels: Advanced cervical degenerative changes with discogenic
degenerative disease greatest at the C5 through C7 levels and
left-greater-than-right facet arthropathy.

Upper chest: Negative.

Other: Calcific atherosclerosis of the aortic arch. Dense calcific
atherosclerosis of the carotid bifurcations. 9 mm nodule within the
left lobe of thyroid.
IMPRESSION: 1. No acute intracranial abnormality or displaced calvarial
fracture.
2. Stable advanced chronic microvascular ischemic changes and
parenchymal volume loss of the brain.
3. No acute fracture or dislocation of the cervical spine.
4. Stable grade 1 C7-T1 anterolisthesis. Stable advanced cervical
spine degenerative changes.

By: Jhemboy Padam M.D.
